# Patient Record
Sex: Female | Born: 1935
Health system: Southern US, Community
[De-identification: ages and names within clinical notes are randomized; demographics above are authoritative.]

## PROBLEM LIST (undated history)

## (undated) DIAGNOSIS — C210 Malignant neoplasm of anus, unspecified: Secondary | ICD-10-CM

## (undated) DIAGNOSIS — K5730 Diverticulosis of large intestine without perforation or abscess without bleeding: Secondary | ICD-10-CM

## (undated) DIAGNOSIS — I10 Essential (primary) hypertension: Secondary | ICD-10-CM

## (undated) DIAGNOSIS — I83812 Varicose veins of left lower extremities with pain: Secondary | ICD-10-CM

## (undated) DIAGNOSIS — K602 Anal fissure, unspecified: Secondary | ICD-10-CM

## (undated) DIAGNOSIS — IMO0002 Reserved for concepts with insufficient information to code with codable children: Secondary | ICD-10-CM

## (undated) DIAGNOSIS — M1990 Unspecified osteoarthritis, unspecified site: Secondary | ICD-10-CM

## (undated) DIAGNOSIS — F0390 Unspecified dementia without behavioral disturbance: Secondary | ICD-10-CM

## (undated) HISTORY — DX: Essential (primary) hypertension: I10

## (undated) HISTORY — PX: BLADDER REPAIR: SHX76

## (undated) HISTORY — DX: Anal fissure, unspecified: K60.2

## (undated) HISTORY — DX: Diverticulosis of large intestine without perforation or abscess without bleeding: K57.30

## (undated) HISTORY — PX: APPENDECTOMY: SHX54

## (undated) HISTORY — PX: POLYPECTOMY: SHX149

## (undated) HISTORY — DX: Reserved for concepts with insufficient information to code with codable children: IMO0002

## (undated) HISTORY — DX: Unspecified osteoarthritis, unspecified site: M19.90

## (undated) HISTORY — PX: ABDOMINAL HYSTERECTOMY: SUR658

---

## 1968-11-18 HISTORY — PX: NEPHRECTOMY: SHX65

## 2000-05-31 ENCOUNTER — Other Ambulatory Visit: Admission: RE | Admit: 2000-05-31 | Discharge: 2000-05-31 | Payer: Self-pay | Admitting: Internal Medicine

## 2000-08-31 ENCOUNTER — Ambulatory Visit (HOSPITAL_COMMUNITY): Admission: RE | Admit: 2000-08-31 | Discharge: 2000-08-31 | Payer: Self-pay | Admitting: *Deleted

## 2000-11-12 ENCOUNTER — Encounter (INDEPENDENT_AMBULATORY_CARE_PROVIDER_SITE_OTHER): Payer: Self-pay

## 2000-11-13 ENCOUNTER — Inpatient Hospital Stay (HOSPITAL_COMMUNITY): Admission: EM | Admit: 2000-11-13 | Discharge: 2000-11-14 | Payer: Self-pay | Admitting: Obstetrics and Gynecology

## 2001-07-31 ENCOUNTER — Other Ambulatory Visit: Admission: RE | Admit: 2001-07-31 | Discharge: 2001-07-31 | Payer: Self-pay | Admitting: Obstetrics and Gynecology

## 2002-03-20 DIAGNOSIS — C210 Malignant neoplasm of anus, unspecified: Secondary | ICD-10-CM

## 2002-03-20 HISTORY — DX: Malignant neoplasm of anus, unspecified: C21.0

## 2002-06-20 ENCOUNTER — Encounter: Payer: Self-pay | Admitting: General Surgery

## 2002-06-20 ENCOUNTER — Encounter: Admission: RE | Admit: 2002-06-20 | Discharge: 2002-06-20 | Payer: Self-pay | Admitting: General Surgery

## 2002-06-24 ENCOUNTER — Ambulatory Visit: Admission: RE | Admit: 2002-06-24 | Discharge: 2002-08-27 | Payer: Self-pay | Admitting: Radiation Oncology

## 2002-07-02 ENCOUNTER — Ambulatory Visit (HOSPITAL_COMMUNITY): Admission: RE | Admit: 2002-07-02 | Discharge: 2002-07-02 | Payer: Self-pay | Admitting: Hematology & Oncology

## 2002-07-02 ENCOUNTER — Encounter: Payer: Self-pay | Admitting: Hematology & Oncology

## 2002-07-30 ENCOUNTER — Ambulatory Visit (HOSPITAL_COMMUNITY): Admission: RE | Admit: 2002-07-30 | Discharge: 2002-07-30 | Payer: Self-pay | Admitting: Internal Medicine

## 2002-09-01 ENCOUNTER — Ambulatory Visit: Admission: RE | Admit: 2002-09-01 | Discharge: 2002-09-01 | Payer: Self-pay | Admitting: Radiation Oncology

## 2002-09-11 ENCOUNTER — Ambulatory Visit: Admission: RE | Admit: 2002-09-11 | Discharge: 2002-09-11 | Payer: Self-pay | Admitting: Radiation Oncology

## 2002-09-15 ENCOUNTER — Ambulatory Visit (HOSPITAL_COMMUNITY): Admission: RE | Admit: 2002-09-15 | Discharge: 2002-09-15 | Payer: Self-pay | Admitting: Hematology & Oncology

## 2002-09-15 ENCOUNTER — Encounter: Payer: Self-pay | Admitting: Hematology & Oncology

## 2002-09-17 ENCOUNTER — Encounter: Payer: Self-pay | Admitting: Hematology & Oncology

## 2002-09-17 ENCOUNTER — Ambulatory Visit (HOSPITAL_COMMUNITY): Admission: RE | Admit: 2002-09-17 | Discharge: 2002-09-17 | Payer: Self-pay | Admitting: Hematology & Oncology

## 2002-12-05 ENCOUNTER — Ambulatory Visit (HOSPITAL_COMMUNITY): Admission: RE | Admit: 2002-12-05 | Discharge: 2002-12-05 | Payer: Self-pay | Admitting: Hematology & Oncology

## 2002-12-05 ENCOUNTER — Encounter: Payer: Self-pay | Admitting: Hematology & Oncology

## 2003-01-01 ENCOUNTER — Ambulatory Visit: Admission: RE | Admit: 2003-01-01 | Discharge: 2003-01-01 | Payer: Self-pay | Admitting: Radiation Oncology

## 2003-04-08 ENCOUNTER — Ambulatory Visit (HOSPITAL_COMMUNITY): Admission: RE | Admit: 2003-04-08 | Discharge: 2003-04-08 | Payer: Self-pay | Admitting: Hematology & Oncology

## 2003-05-08 ENCOUNTER — Ambulatory Visit: Admission: RE | Admit: 2003-05-08 | Discharge: 2003-05-08 | Payer: Self-pay | Admitting: Radiation Oncology

## 2003-05-15 ENCOUNTER — Other Ambulatory Visit: Admission: RE | Admit: 2003-05-15 | Discharge: 2003-05-15 | Payer: Self-pay | Admitting: Obstetrics and Gynecology

## 2003-07-23 ENCOUNTER — Ambulatory Visit (HOSPITAL_COMMUNITY): Admission: RE | Admit: 2003-07-23 | Discharge: 2003-07-23 | Payer: Self-pay | Admitting: Hematology & Oncology

## 2003-12-01 ENCOUNTER — Ambulatory Visit (HOSPITAL_COMMUNITY): Admission: RE | Admit: 2003-12-01 | Discharge: 2003-12-01 | Payer: Self-pay | Admitting: Hematology & Oncology

## 2004-03-03 ENCOUNTER — Ambulatory Visit: Admission: RE | Admit: 2004-03-03 | Discharge: 2004-03-03 | Payer: Self-pay | Admitting: Radiation Oncology

## 2004-04-20 ENCOUNTER — Ambulatory Visit: Payer: Self-pay | Admitting: Hematology & Oncology

## 2004-04-21 ENCOUNTER — Ambulatory Visit (HOSPITAL_COMMUNITY): Admission: RE | Admit: 2004-04-21 | Discharge: 2004-04-21 | Payer: Self-pay | Admitting: Hematology & Oncology

## 2004-05-16 ENCOUNTER — Other Ambulatory Visit: Admission: RE | Admit: 2004-05-16 | Discharge: 2004-05-16 | Payer: Self-pay | Admitting: Obstetrics and Gynecology

## 2004-05-18 HISTORY — PX: CATARACT EXTRACTION: SUR2

## 2004-06-10 ENCOUNTER — Ambulatory Visit (HOSPITAL_COMMUNITY): Admission: RE | Admit: 2004-06-10 | Discharge: 2004-06-10 | Payer: Self-pay | Admitting: Ophthalmology

## 2004-10-19 ENCOUNTER — Ambulatory Visit: Payer: Self-pay | Admitting: Hematology & Oncology

## 2004-10-24 ENCOUNTER — Ambulatory Visit (HOSPITAL_COMMUNITY): Admission: RE | Admit: 2004-10-24 | Discharge: 2004-10-24 | Payer: Self-pay | Admitting: Hematology & Oncology

## 2005-04-28 ENCOUNTER — Ambulatory Visit: Payer: Self-pay | Admitting: Hematology & Oncology

## 2005-05-02 ENCOUNTER — Ambulatory Visit (HOSPITAL_COMMUNITY): Admission: RE | Admit: 2005-05-02 | Discharge: 2005-05-02 | Payer: Self-pay | Admitting: Hematology & Oncology

## 2005-05-16 ENCOUNTER — Other Ambulatory Visit: Admission: RE | Admit: 2005-05-16 | Discharge: 2005-05-16 | Payer: Self-pay | Admitting: Obstetrics and Gynecology

## 2005-10-02 ENCOUNTER — Emergency Department (HOSPITAL_COMMUNITY): Admission: EM | Admit: 2005-10-02 | Discharge: 2005-10-02 | Payer: Self-pay | Admitting: Emergency Medicine

## 2005-11-02 ENCOUNTER — Ambulatory Visit: Payer: Self-pay | Admitting: Hematology & Oncology

## 2005-11-02 LAB — COMPREHENSIVE METABOLIC PANEL
Alkaline Phosphatase: 79 U/L (ref 39–117)
BUN: 22 mg/dL (ref 6–23)
Glucose, Bld: 105 mg/dL — ABNORMAL HIGH (ref 70–99)
Total Bilirubin: 0.5 mg/dL (ref 0.3–1.2)

## 2005-11-02 LAB — CBC WITH DIFFERENTIAL/PLATELET
Basophils Absolute: 0 10*3/uL (ref 0.0–0.1)
EOS%: 5.5 % (ref 0.0–7.0)
Eosinophils Absolute: 0.2 10*3/uL (ref 0.0–0.5)
LYMPH%: 23 % (ref 14.0–48.0)
MCH: 29.5 pg (ref 26.0–34.0)
MCV: 85.3 fL (ref 81.0–101.0)
MONO%: 9.2 % (ref 0.0–13.0)
NEUT#: 2.1 10*3/uL (ref 1.5–6.5)
Platelets: 168 10*3/uL (ref 145–400)
RBC: 4.72 10*6/uL (ref 3.70–5.32)

## 2006-04-30 ENCOUNTER — Ambulatory Visit: Payer: Self-pay | Admitting: Hematology & Oncology

## 2006-05-21 ENCOUNTER — Other Ambulatory Visit: Admission: RE | Admit: 2006-05-21 | Discharge: 2006-05-21 | Payer: Self-pay | Admitting: Obstetrics and Gynecology

## 2008-04-08 ENCOUNTER — Encounter: Admission: RE | Admit: 2008-04-08 | Discharge: 2008-04-08 | Payer: Self-pay | Admitting: Internal Medicine

## 2008-04-25 ENCOUNTER — Emergency Department (HOSPITAL_COMMUNITY): Admission: EM | Admit: 2008-04-25 | Discharge: 2008-04-25 | Payer: Self-pay | Admitting: Emergency Medicine

## 2008-05-20 ENCOUNTER — Other Ambulatory Visit: Admission: RE | Admit: 2008-05-20 | Discharge: 2008-05-20 | Payer: Self-pay | Admitting: Obstetrics and Gynecology

## 2008-05-21 ENCOUNTER — Ambulatory Visit: Payer: Self-pay | Admitting: Obstetrics and Gynecology

## 2008-05-21 ENCOUNTER — Encounter: Payer: Self-pay | Admitting: Obstetrics and Gynecology

## 2008-08-16 ENCOUNTER — Encounter (INDEPENDENT_AMBULATORY_CARE_PROVIDER_SITE_OTHER): Payer: Self-pay | Admitting: Emergency Medicine

## 2008-08-16 ENCOUNTER — Emergency Department (HOSPITAL_COMMUNITY): Admission: EM | Admit: 2008-08-16 | Discharge: 2008-08-16 | Payer: Self-pay | Admitting: Emergency Medicine

## 2008-08-16 ENCOUNTER — Ambulatory Visit: Payer: Self-pay | Admitting: *Deleted

## 2010-03-19 ENCOUNTER — Emergency Department (HOSPITAL_COMMUNITY)
Admission: EM | Admit: 2010-03-19 | Discharge: 2010-03-19 | Payer: Self-pay | Source: Home / Self Care | Admitting: Emergency Medicine

## 2010-03-23 ENCOUNTER — Emergency Department (HOSPITAL_COMMUNITY)
Admission: EM | Admit: 2010-03-23 | Discharge: 2010-03-23 | Payer: Self-pay | Source: Home / Self Care | Admitting: Emergency Medicine

## 2010-03-25 ENCOUNTER — Emergency Department (HOSPITAL_COMMUNITY)
Admission: EM | Admit: 2010-03-25 | Discharge: 2010-03-25 | Payer: Self-pay | Source: Home / Self Care | Admitting: Emergency Medicine

## 2010-04-01 ENCOUNTER — Emergency Department (HOSPITAL_COMMUNITY)
Admission: EM | Admit: 2010-04-01 | Discharge: 2010-04-01 | Payer: Self-pay | Source: Home / Self Care | Admitting: Emergency Medicine

## 2010-04-09 ENCOUNTER — Encounter: Payer: Self-pay | Admitting: Hematology & Oncology

## 2010-05-23 ENCOUNTER — Encounter (INDEPENDENT_AMBULATORY_CARE_PROVIDER_SITE_OTHER): Payer: Medicare Other | Admitting: Obstetrics and Gynecology

## 2010-05-23 ENCOUNTER — Other Ambulatory Visit (HOSPITAL_COMMUNITY)
Admission: RE | Admit: 2010-05-23 | Discharge: 2010-05-23 | Disposition: A | Discharge status: Home / Self Care | Payer: Medicare Other | Source: Ambulatory Visit | Attending: Obstetrics and Gynecology | Admitting: Obstetrics and Gynecology

## 2010-05-23 ENCOUNTER — Other Ambulatory Visit: Payer: Self-pay | Admitting: Obstetrics and Gynecology

## 2010-05-23 DIAGNOSIS — N952 Postmenopausal atrophic vaginitis: Secondary | ICD-10-CM

## 2010-05-23 DIAGNOSIS — N951 Menopausal and female climacteric states: Secondary | ICD-10-CM

## 2010-05-23 DIAGNOSIS — Z124 Encounter for screening for malignant neoplasm of cervix: Secondary | ICD-10-CM

## 2010-05-23 DIAGNOSIS — K648 Other hemorrhoids: Secondary | ICD-10-CM

## 2010-07-05 LAB — COMPREHENSIVE METABOLIC PANEL
ALT: 25 U/L (ref 0–35)
CO2: 28 mEq/L (ref 19–32)
Chloride: 104 mEq/L (ref 96–112)
Creatinine, Ser: 0.92 mg/dL (ref 0.4–1.2)
Potassium: 4.2 mEq/L (ref 3.5–5.1)
Sodium: 140 mEq/L (ref 135–145)
Total Protein: 5.8 g/dL — ABNORMAL LOW (ref 6.0–8.3)

## 2010-07-05 LAB — CBC
HCT: 34.7 % — ABNORMAL LOW (ref 36.0–46.0)
Hemoglobin: 12.3 g/dL (ref 12.0–15.0)
MCV: 86.6 fL (ref 78.0–100.0)
RBC: 4 MIL/uL (ref 3.87–5.11)
RDW: 13.8 % (ref 11.5–15.5)
WBC: 4.1 10*3/uL (ref 4.0–10.5)

## 2010-07-05 LAB — DIFFERENTIAL
Basophils Absolute: 0 10*3/uL (ref 0.0–0.1)
Eosinophils Relative: 5 % (ref 0–5)
Neutro Abs: 2.7 10*3/uL (ref 1.7–7.7)

## 2010-07-05 LAB — URINE MICROSCOPIC-ADD ON

## 2010-07-05 LAB — URINALYSIS, ROUTINE W REFLEX MICROSCOPIC
Ketones, ur: NEGATIVE mg/dL
Specific Gravity, Urine: 1.015 (ref 1.005–1.030)
Urobilinogen, UA: 0.2 mg/dL (ref 0.0–1.0)
pH: 6.5 (ref 5.0–8.0)

## 2010-07-12 ENCOUNTER — Other Ambulatory Visit: Payer: Self-pay | Admitting: Internal Medicine

## 2010-07-12 ENCOUNTER — Ambulatory Visit
Admission: RE | Admit: 2010-07-12 | Discharge: 2010-07-12 | Disposition: A | Discharge status: Home / Self Care | Payer: PRIVATE HEALTH INSURANCE | Source: Ambulatory Visit | Attending: Internal Medicine | Admitting: Internal Medicine

## 2010-07-12 DIAGNOSIS — M545 Low back pain: Secondary | ICD-10-CM

## 2010-07-13 ENCOUNTER — Other Ambulatory Visit (HOSPITAL_COMMUNITY): Payer: Self-pay | Admitting: Internal Medicine

## 2010-07-13 DIAGNOSIS — M533 Sacrococcygeal disorders, not elsewhere classified: Secondary | ICD-10-CM

## 2010-07-13 DIAGNOSIS — R52 Pain, unspecified: Secondary | ICD-10-CM

## 2010-07-15 ENCOUNTER — Encounter (HOSPITAL_COMMUNITY)
Admission: RE | Admit: 2010-07-15 | Discharge: 2010-07-15 | Disposition: A | Discharge status: Home / Self Care | Payer: Medicare Other | Source: Ambulatory Visit | Attending: Internal Medicine | Admitting: Internal Medicine

## 2010-07-15 ENCOUNTER — Ambulatory Visit (HOSPITAL_COMMUNITY): Payer: PRIVATE HEALTH INSURANCE

## 2010-07-15 DIAGNOSIS — M533 Sacrococcygeal disorders, not elsewhere classified: Secondary | ICD-10-CM

## 2010-07-15 DIAGNOSIS — Z905 Acquired absence of kidney: Secondary | ICD-10-CM | POA: Insufficient documentation

## 2010-07-15 DIAGNOSIS — C20 Malignant neoplasm of rectum: Secondary | ICD-10-CM | POA: Insufficient documentation

## 2010-07-15 MED ORDER — TECHNETIUM TC 99M MEDRONATE IV KIT
23.1000 | PACK | Freq: Once | INTRAVENOUS | Status: AC | PRN
  Administered 2010-07-15: 23.1 via INTRAVENOUS

## 2010-07-21 ENCOUNTER — Ambulatory Visit (HOSPITAL_COMMUNITY): Payer: PRIVATE HEALTH INSURANCE

## 2010-07-21 ENCOUNTER — Encounter (HOSPITAL_COMMUNITY): Payer: PRIVATE HEALTH INSURANCE

## 2010-07-22 ENCOUNTER — Other Ambulatory Visit: Payer: Self-pay | Admitting: Oncology

## 2010-07-22 ENCOUNTER — Encounter (HOSPITAL_BASED_OUTPATIENT_CLINIC_OR_DEPARTMENT_OTHER): Payer: Medicare Other | Admitting: Oncology

## 2010-07-22 DIAGNOSIS — Z85048 Personal history of other malignant neoplasm of rectum, rectosigmoid junction, and anus: Secondary | ICD-10-CM

## 2010-07-22 DIAGNOSIS — M899 Disorder of bone, unspecified: Secondary | ICD-10-CM

## 2010-07-22 DIAGNOSIS — C189 Malignant neoplasm of colon, unspecified: Secondary | ICD-10-CM

## 2010-07-22 DIAGNOSIS — I10 Essential (primary) hypertension: Secondary | ICD-10-CM

## 2010-07-22 DIAGNOSIS — Z85828 Personal history of other malignant neoplasm of skin: Secondary | ICD-10-CM

## 2010-07-22 DIAGNOSIS — C210 Malignant neoplasm of anus, unspecified: Secondary | ICD-10-CM

## 2010-07-22 LAB — CBC WITH DIFFERENTIAL/PLATELET
BASO%: 0.3 % (ref 0.0–2.0)
Basophils Absolute: 0 10*3/uL (ref 0.0–0.1)
EOS%: 2.9 % (ref 0.0–7.0)
Eosinophils Absolute: 0.1 10*3/uL (ref 0.0–0.5)
HCT: 33.9 % — ABNORMAL LOW (ref 34.8–46.6)
HGB: 11.9 g/dL (ref 11.6–15.9)
MCV: 89.4 fL (ref 79.5–101.0)
MONO#: 0.3 10*3/uL (ref 0.1–0.9)
MONO%: 6.6 % (ref 0.0–14.0)
NEUT#: 3.6 10*3/uL (ref 1.5–6.5)
NEUT%: 76.4 % (ref 38.4–76.8)
Platelets: 136 10*3/uL — ABNORMAL LOW (ref 145–400)
RBC: 3.79 10*6/uL (ref 3.70–5.45)
lymph#: 0.7 10*3/uL — ABNORMAL LOW (ref 0.9–3.3)

## 2010-07-26 LAB — COMPREHENSIVE METABOLIC PANEL
AST: 22 U/L (ref 0–37)
Albumin: 4.2 g/dL (ref 3.5–5.2)
Calcium: 9.4 mg/dL (ref 8.4–10.5)
Chloride: 103 mEq/L (ref 96–112)
Glucose, Bld: 112 mg/dL — ABNORMAL HIGH (ref 70–99)

## 2010-07-26 LAB — SPEP & IFE WITH QIG: Alpha-2-Globulin: 10.3 % (ref 7.1–11.8)

## 2010-07-26 LAB — LACTATE DEHYDROGENASE: LDH: 156 U/L (ref 94–250)

## 2010-07-29 ENCOUNTER — Encounter (HOSPITAL_COMMUNITY)
Admission: RE | Admit: 2010-07-29 | Discharge: 2010-07-29 | Disposition: A | Discharge status: Home / Self Care | Payer: Medicare Other | Source: Ambulatory Visit | Attending: Oncology | Admitting: Oncology

## 2010-07-29 ENCOUNTER — Encounter (HOSPITAL_COMMUNITY): Payer: Self-pay

## 2010-07-29 DIAGNOSIS — C7951 Secondary malignant neoplasm of bone: Secondary | ICD-10-CM | POA: Insufficient documentation

## 2010-07-29 DIAGNOSIS — C7952 Secondary malignant neoplasm of bone marrow: Secondary | ICD-10-CM | POA: Insufficient documentation

## 2010-07-29 DIAGNOSIS — C210 Malignant neoplasm of anus, unspecified: Secondary | ICD-10-CM | POA: Insufficient documentation

## 2010-07-29 DIAGNOSIS — C189 Malignant neoplasm of colon, unspecified: Secondary | ICD-10-CM | POA: Insufficient documentation

## 2010-07-29 MED ORDER — FLUDEOXYGLUCOSE F - 18 (FDG) INJECTION
16.4000 | Freq: Once | INTRAVENOUS | Status: AC | PRN
  Administered 2010-07-29: 16.4 via INTRAVENOUS

## 2010-08-05 NOTE — Discharge Summary (Signed)
[facility]  Patient:    Shannon Blair, WITZKE Visit Number: [id number] MRN: [id number]          Service Type: SUR Location: 4W 0460 [id number] Attending Physician:  Sharon Mt Dictated by:   Rande Brunt. Eda Paschal, M.D. Adm. Date:  11/12/2000 Disc. Date: 11/14/00                             Discharge Summary  HISTORY OF PRESENT ILLNESS:  The patient is a 77 year old female who was admitted to the hospital with uterine prolapse, cystocele, and rectocele for surgery.  HOSPITAL COURSE:  On the day of admission, she underwent a vaginal hysterectomy, bilateral salpingo-oophorectomy, anterior and posterior repair. Postoperatively, she did well except for having to keep her Foley in for two days because of swelling from the bladder repair and an ileus which responded to IVs and restricting her diet.  By the second postoperative day, her Foley was ready to be removed.  Assuming she voids well, she will be discharged later today.  DISCHARGE MEDICATIONS: 1. Cipro 250 mg b.i.d. to finish a 10 day course because of a urinary tract    infection of greater than 100,000 E. coli sensitive to Cipro. 2. Vicodin for pain medication.  ACTIVITY:  Ambulatory.  CONDITION ON DISCHARGE:  Improved.  DISCHARGE DIAGNOSES: 1. Uterine prolapse. 2. Cystocele, rectocele.  OPERATION:  Vaginal hysterectomy, bilateral salpingo-oophorectomy, anterior and posterior repair. Dictated by:   Rande Brunt. Eda Paschal, M.D. Attending Physician:  Sharon Mt DD:  11/14/00 TD:  11/14/00 Job: [id number]

## 2010-08-05 NOTE — Op Note (Signed)
NAMELYANNA, BLYSTONE NO.:  [id number]   MEDICAL RECORD NO.:  [id number]          PATIENT TYPE:  OIB   LOCATION:  2899                         FACILITY:  [facility]   PHYSICIAN:  Guadelupe Sabin, M.D.DATE OF BIRTH:  1932-01-03   DATE OF PROCEDURE:  06/10/2004  DATE OF DISCHARGE:  06/10/2004                                 OPERATIVE REPORT   PREOPERATIVE DIAGNOSIS:  Senile nuclear cataract, right eye.   POSTOPERATIVE DIAGNOSIS:  Senile nuclear cataract, right eye.   NAME OF OPERATION:  Planned extracapsular cataract extraction --  phacoemulsification, primary insertion of posterior chamber intraocular lens  implant.   SURGEON:  Guadelupe Sabin, M.D.   ASSISTANT:  Nurse.   ANESTHESIA:  Local 4% Xylocaine, 0.75% Marcaine, anesthesia standby required  in this elderly patient, patient given sodium Pentothal under in the period  of retrobulbar injection.   OPERATIVE PROCEDURE:  After the patient was prepped and draped, a lid  speculum was inserted in the right eye.  The eye was turned downward and a  superior rectus traction suture placed.  There was slight conjunctival  bleeding which was cauterized and stopped.  A peritomy was performed  adjacent to the limbus from the 11 to 1 o'clock position.  The corneoscleral  junction was cleaned and a corneoscleral groove made with a 45-degrees  Superblade.  The anterior chamber was then entered at the 12 o'clock  position with a 2.5-mm diamond keratome and at the 2:30 position with a 15-  degree blade.  Using a bent 26-gauge needle on an OcuCoat syringe, a  circular capsulorrhexis was begun.  It was difficult to see the circular  capsulorrhexis and it was somewhat irregular.  Hydrodissection and  hydrodelineation were performed using 1% Xylocaine.  A 30-degree  phacoemulsification tip was then inserted with slow controlled  emulsification of the lens nucleus with back-cracking and fragmentation with  the Bechert pick.   Total ultrasonic time -- 32 seconds, average power level  -- 16%, total amount of fluid used -- 50 mL.  Following removal of the  slightly brunescent nucleus, the residual cortex was aspirated with the  silicone-tipped irrigation-aspiration tip.  The posterior capsule appeared  intact with a brilliant red fundus reflex; it was therefore elected to  insert an [employer] [id number] silicone three-piece posterior  chamber implant, diopter strength +24.00; this was inserted with the  McDonald forceps into the anterior chamber and then centered into the  capsular bag using the [facility] lens rotator; the lens appeared to be well-  centered.  The OcuCoat and Provisc which had been used previously were  aspirated and replaced with balanced salt solution and Miochol ophthalmic  solution.  The operative incision which had been extended to approximately 4-  5 mm was sutured with three 10-0 interrupted  nylon sutures.  Maxitrol ointment was instilled in the conjunctival cul-de-  sac.  A light patch and protective shield were applied to the operated right  eye.  Duration of procedure and anesthesia administration -- 45 minutes.  The patient tolerated the procedure well in general and left the operating  room for the recovery room in good condition.      HNJ/MEDQ  D:  06/10/2004  T:  06/11/2004  Job:  [id number]

## 2010-08-05 NOTE — Procedures (Signed)
[facility]  Patient:    Shannon Blair, Shannon Blair                       MRN: [id number] Proc. Date: 08/31/00 Attending:  Sabino Gasser, M.D.                           Procedure Report  PROCEDURE:  Colonoscopy.  INDICATIONS:  Rectal bleeding.  ANESTHESIA:  Demerol 70 mg, Versed 6.5 mg.  DESCRIPTION OF PROCEDURE:  With the patient mildly sedated in the left lateral decubitus position, a rectal examination was performed.  There was red blood on the examining finger.  Hemorrhoids were seen externally.  The colonoscope was inserted into the rectum, and immediately we saw a fissure in the anal canal, and this was photographed.  It was bleeding.  We advanced from this point to the cecum, identified by the ileocecal valve and appendiceal orifice, both of which were photographed.  From this point, the colonoscope was slowly withdrawn taking circumferential views of the entire colonic mucosa stopping to photograph rare diverticula seen along the way in the sigmoid colon until we reached the rectum again, which appeared normal on direct view and showed large internal hemorrhoids and the fissure on retroflexed view.  The patients vital signs and pulse oximeter remained stable.  The patient tolerated the procedure well without apparent complications.  FINDINGS:  Fissure of anal canal, large internal hemorrhoids, rare diverticulum of sigmoid colon, otherwise unremarkable examination.  PLAN:  Have patient follow up with me as needed. DD:  08/31/00 TD:  08/31/00 Job: [id number]

## 2010-08-05 NOTE — H&P (Signed)
[name], REGAL NO.:  [id number]   MEDICAL RECORD NO.:  [id number]          PATIENT TYPE:  OIB   LOCATION:  2899                         FACILITY:  [facility]   PHYSICIAN:  Shannon Blair, M.D.DATE OF BIRTH:  01-10-1932   DATE OF ADMISSION:  06/10/2004  DATE OF DISCHARGE:                                HISTORY & PHYSICAL   REASON FOR ADMISSION:  This was a planned outpatient surgical admission of  this 77 year old, white female admitted for cataract implant surgery of the  right eye.   PRESENT ILLNESS:  This patient has been followed in my office for routine  eye care since August 28, 1975.  Examination at that time revealed early  presbyopia.  Over the ensuing years the patient has done well with  refractive changes.  Recently, however, beginning in 1999, slight anterior  subcapsular and cortical cataract change has developed in both eyes.  Vision  has remained fairly well, but the patient has had increasing opacity of both  eyes.  When she was seen on May 16, 2004, vision was recorded at 20/25  plus right eye, 20/20 minus left eye.  The patient complained of blurred  vision.  She stated she no longer could see the road at night and glare was  extremely bothersome.  A glare test revealed a visual acuity of less than  20/400 in each eye.  The patient, due to her complaints, elected to proceed  with cataract implant surgery of the left eye now, right eye later.  Slit  lamp examination confirmed the presence of a dense nuclear and cortical  cataract change in both eyes.  Applanation tonometry was normal 17 mm right  eye, 20 left eye.  Dilated fundus examination was normal with a clear  vitreous, attached retina, normal optic nerve blood vessels and macula.   PAST MEDICAL HISTORY:  The patient is felt to be in satisfactory condition  by Dr. Nicholos Blair. Dr. Nicholos Blair notes the patient's good general  health and good cardiorespiratory system.  The patient  is to discontinue her  81 mg of aspirin prior to surgery.   ALLERGIES:  She has no other no allergies.   REVIEW OF SYSTEMS:  No cardiorespiratory complaints.   PHYSICAL EXAMINATION:  GENERAL:  The patient is alert, well-nourished, well-  developed, white female in no acute distress.  VITAL SIGNS:  As recorded on admission, blood pressure 147/68, temperature  97.8, heart rate 71, respirations 12.  HEENT:  EYES:  Ocular exam as noted above.  CHEST/LUNGS:  Clear to percussion and auscultation.  HEART:  Normal sinus rhythm, no cardiomegaly.  No murmurs.  ABDOMEN:  Negative.  EXTREMITIES:  Negative.   ADMISSION DIAGNOSIS:  Senile cataract both eyes.   PLAN:  Cataract implant surgery left eye now, right eye later.      HNJ/MEDQ  D:  06/10/2004  T:  06/10/2004  Job:  [id number]   cc:   Shannon Blair, M.D.  [address]  [place]  [place] [address]  Fax: [phone]

## 2010-08-12 ENCOUNTER — Encounter (HOSPITAL_BASED_OUTPATIENT_CLINIC_OR_DEPARTMENT_OTHER): Payer: Medicare Other | Admitting: Oncology

## 2010-08-12 DIAGNOSIS — C7951 Secondary malignant neoplasm of bone: Secondary | ICD-10-CM

## 2010-08-12 DIAGNOSIS — Z85048 Personal history of other malignant neoplasm of rectum, rectosigmoid junction, and anus: Secondary | ICD-10-CM

## 2010-08-31 ENCOUNTER — Ambulatory Visit (INDEPENDENT_AMBULATORY_CARE_PROVIDER_SITE_OTHER): Payer: Medicare Other | Admitting: Internal Medicine

## 2010-08-31 ENCOUNTER — Encounter: Payer: Self-pay | Admitting: Internal Medicine

## 2010-08-31 VITALS — BP 138/60 | HR 76 | Ht 63.0 in | Wt 142.6 lb

## 2010-08-31 DIAGNOSIS — R1084 Generalized abdominal pain: Secondary | ICD-10-CM

## 2010-08-31 DIAGNOSIS — K5900 Constipation, unspecified: Secondary | ICD-10-CM

## 2010-08-31 DIAGNOSIS — R933 Abnormal findings on diagnostic imaging of other parts of digestive tract: Secondary | ICD-10-CM

## 2010-08-31 MED ORDER — PEG-KCL-NACL-NASULF-NA ASC-C 100 G PO SOLR: 1.0000 | Freq: Once | ORAL | Status: DC

## 2010-08-31 NOTE — Patient Instructions (Signed)
[facility]on [facility] 09/07/10 2:00 pm arrive at 1:00 pm  Moviprep prescription sent to pharmacy Colonoscopy brochure given for you to review.

## 2010-08-31 NOTE — Progress Notes (Signed)
HISTORY OF PRESENT ILLNESS:  Shannon Blair is a 77 y.o. female with hypertension, osteoarthritis, squamous cell carcinoma of the skin, and squamous rectal cancer diagnosed in 2004. She was treated with concomitant radiation and chemotherapy. No surgery. She has had regular check ups with Dr. Zachery Dakins, as recently as this past year, with no evidence of recurrent disease. Initial colonoscopy performed by Dr. Sabino Gasser in 2002 for rectal bleeding. The examination was said to show a fissure of the anal canal as well as large internal hemorrhoids and rare sigmoid diverticula. Her recent history is that of low back abdominal pain which led to lumbar spine films as well as spinal MRI. Changes of metastatic bone disease identified. Subsequent oncology referral with additional workup including head CT. Again, metastatic appearing lesions in the bone as well as hypermetabolic activity in the rectum concerning for cancer. Patient states that her pain is better. Currently improvement and constipation after taking V-8 juice. She is accompanied by her daughter. She denies nausea, vomiting, or rectal bleeding.  REVIEW OF SYSTEMS:  All non-GI ROS negative except for back pain, confusion, fatigue.  Past Medical History  Diagnosis Date  . Colon cancer     Anus  . Hypertension   . OA (osteoarthritis)   . Squamous cell carcinoma     skin  . Anal fissure   . Colon, diverticulosis     sigmoid    Past Surgical History  Procedure Date  . Nephrectomy     Rt.  Marland Kitchen Abdominal hysterectomy   . Bladder repair     Social History RUIE SENDEJO  reports that she has never smoked. She has never used smokeless tobacco. She reports that she does not drink alcohol or use illicit drugs.  family history includes Breast cancer in her sister; Coronary artery disease in her father; Diabetes in her daughter; Leukemia in her brother; and Ovarian cancer in her sister.  There is no history of Colon cancer.  No Known  Allergies     PHYSICAL EXAMINATION: Vital signs: BP 138/60  Pulse 76  Ht 5\' 3"  (1.6 m)  Wt 142 lb 9.6 oz (64.683 kg)  BMI 25.26 kg/m2  Constitutional: generally well-appearing, no acute distress Psychiatric: alert and oriented x3, cooperative Eyes: extraocular movements intact, anicteric, conjunctiva pink Mouth: oral pharynx moist, no lesions Neck: supple no lymphadenopathy Cardiovascular: heart regular rate and rhythm, no murmur Lungs: clear to auscultation bilaterally Abdomen: soft, nontender, nondistended, no obvious ascites, no peritoneal signs, normal bowel sounds, no organomegaly Rectal: Deferred until colonoscopy Extremities: no lower extremity edema bilaterally Skin: no lesions on visible extremities Neuro: No focal deficits.  ASSESSMENT:  #1. Probable metastatic disease to the spine. #2. History of squamous cell carcinoma of the anus #3. Prior colonoscopy in 2002 as described   PLAN:  #1. ColonoscopyThe nature of the procedure, as well as the risks, benefits, and alternatives were carefully and thoroughly reviewed with the patient. Ample time for discussion and questions allowed. The patient understood, was satisfied, and agreed to proceed. Movi prep prescribed. The patient instructed on its use

## 2010-09-07 ENCOUNTER — Encounter: Payer: Self-pay | Admitting: Internal Medicine

## 2010-09-07 ENCOUNTER — Ambulatory Visit (AMBULATORY_SURGERY_CENTER): Payer: Medicare Other | Admitting: Internal Medicine

## 2010-09-07 VITALS — BP 128/58 | HR 66 | Temp 97.3°F | Resp 20 | Ht 63.0 in | Wt 142.0 lb

## 2010-09-07 DIAGNOSIS — K5730 Diverticulosis of large intestine without perforation or abscess without bleeding: Secondary | ICD-10-CM

## 2010-09-07 DIAGNOSIS — R933 Abnormal findings on diagnostic imaging of other parts of digestive tract: Secondary | ICD-10-CM

## 2010-09-07 DIAGNOSIS — K624 Stenosis of anus and rectum: Secondary | ICD-10-CM

## 2010-09-07 DIAGNOSIS — K56609 Unspecified intestinal obstruction, unspecified as to partial versus complete obstruction: Secondary | ICD-10-CM

## 2010-09-07 DIAGNOSIS — Z85048 Personal history of other malignant neoplasm of rectum, rectosigmoid junction, and anus: Secondary | ICD-10-CM

## 2010-09-07 DIAGNOSIS — Z85828 Personal history of other malignant neoplasm of skin: Secondary | ICD-10-CM

## 2010-09-07 DIAGNOSIS — L851 Acquired keratosis [keratoderma] palmaris et plantaris: Secondary | ICD-10-CM

## 2010-09-07 MED ORDER — SODIUM CHLORIDE 0.9 % IV SOLN: 500.0000 mL | INTRAVENOUS | Status: DC

## 2010-09-07 NOTE — Patient Instructions (Signed)
Discharge instructions given with verbal understanding. Handout on diverticulosis and a high fiber diet given. Resume previous medications. 

## 2010-09-08 ENCOUNTER — Telehealth: Payer: Self-pay | Admitting: *Deleted

## 2010-09-08 NOTE — Telephone Encounter (Signed)
Message left to call us if necessary. 

## 2010-09-29 ENCOUNTER — Other Ambulatory Visit: Payer: Self-pay | Admitting: Oncology

## 2010-09-29 ENCOUNTER — Encounter (HOSPITAL_BASED_OUTPATIENT_CLINIC_OR_DEPARTMENT_OTHER): Payer: Medicare Other | Admitting: Oncology

## 2010-09-29 DIAGNOSIS — Z85048 Personal history of other malignant neoplasm of rectum, rectosigmoid junction, and anus: Secondary | ICD-10-CM

## 2010-09-29 DIAGNOSIS — C7951 Secondary malignant neoplasm of bone: Secondary | ICD-10-CM

## 2010-09-29 DIAGNOSIS — Z1212 Encounter for screening for malignant neoplasm of rectum: Secondary | ICD-10-CM

## 2010-11-30 ENCOUNTER — Encounter (HOSPITAL_COMMUNITY): Payer: Self-pay

## 2010-11-30 ENCOUNTER — Encounter (HOSPITAL_COMMUNITY)
Admission: RE | Admit: 2010-11-30 | Discharge: 2010-11-30 | Disposition: A | Discharge status: Home / Self Care | Payer: Medicare Other | Source: Ambulatory Visit | Attending: Oncology | Admitting: Oncology

## 2010-11-30 ENCOUNTER — Encounter (HOSPITAL_BASED_OUTPATIENT_CLINIC_OR_DEPARTMENT_OTHER): Payer: Medicare Other | Admitting: Oncology

## 2010-11-30 ENCOUNTER — Other Ambulatory Visit: Payer: Self-pay | Admitting: Oncology

## 2010-11-30 DIAGNOSIS — C20 Malignant neoplasm of rectum: Secondary | ICD-10-CM | POA: Insufficient documentation

## 2010-11-30 DIAGNOSIS — Z85048 Personal history of other malignant neoplasm of rectum, rectosigmoid junction, and anus: Secondary | ICD-10-CM

## 2010-11-30 DIAGNOSIS — C7952 Secondary malignant neoplasm of bone marrow: Secondary | ICD-10-CM

## 2010-11-30 DIAGNOSIS — Z1212 Encounter for screening for malignant neoplasm of rectum: Secondary | ICD-10-CM

## 2010-11-30 LAB — CBC WITH DIFFERENTIAL/PLATELET
Basophils Absolute: 0 10*3/uL (ref 0.0–0.1)
Eosinophils Absolute: 0.1 10*3/uL (ref 0.0–0.5)
HCT: 38.1 % (ref 34.8–46.6)
HGB: 13.4 g/dL (ref 11.6–15.9)
LYMPH%: 19.7 % (ref 14.0–49.7)
MCV: 88.5 fL (ref 79.5–101.0)
MONO%: 6.8 % (ref 0.0–14.0)
NEUT#: 2.4 10*3/uL (ref 1.5–6.5)
NEUT%: 68.9 % (ref 38.4–76.8)
Platelets: 135 10*3/uL — ABNORMAL LOW (ref 145–400)

## 2010-11-30 LAB — COMPREHENSIVE METABOLIC PANEL
Albumin: 4.2 g/dL (ref 3.5–5.2)
Alkaline Phosphatase: 103 U/L (ref 39–117)
BUN: 15 mg/dL (ref 6–23)
Glucose, Bld: 80 mg/dL (ref 70–99)
Potassium: 4.1 mEq/L (ref 3.5–5.3)
Total Bilirubin: 0.5 mg/dL (ref 0.3–1.2)

## 2010-11-30 MED ORDER — TECHNETIUM TC 99M MEDRONATE IV KIT
24.2000 | PACK | Freq: Once | INTRAVENOUS | Status: AC | PRN
  Administered 2010-11-30: 24.2 via INTRAVENOUS

## 2010-12-02 ENCOUNTER — Encounter (HOSPITAL_BASED_OUTPATIENT_CLINIC_OR_DEPARTMENT_OTHER): Payer: Medicare Other | Admitting: Oncology

## 2010-12-02 DIAGNOSIS — C7951 Secondary malignant neoplasm of bone: Secondary | ICD-10-CM

## 2010-12-02 DIAGNOSIS — Z85048 Personal history of other malignant neoplasm of rectum, rectosigmoid junction, and anus: Secondary | ICD-10-CM

## 2011-05-05 ENCOUNTER — Other Ambulatory Visit: Payer: Self-pay | Admitting: Oncology

## 2011-05-05 DIAGNOSIS — M899 Disorder of bone, unspecified: Secondary | ICD-10-CM

## 2011-05-05 DIAGNOSIS — C189 Malignant neoplasm of colon, unspecified: Secondary | ICD-10-CM

## 2011-05-09 DIAGNOSIS — IMO0001 Reserved for inherently not codable concepts without codable children: Secondary | ICD-10-CM | POA: Diagnosis not present

## 2011-05-09 DIAGNOSIS — M5430 Sciatica, unspecified side: Secondary | ICD-10-CM | POA: Diagnosis not present

## 2011-05-09 DIAGNOSIS — M549 Dorsalgia, unspecified: Secondary | ICD-10-CM | POA: Diagnosis not present

## 2011-05-29 ENCOUNTER — Encounter (HOSPITAL_COMMUNITY)
Admission: RE | Admit: 2011-05-29 | Discharge: 2011-05-29 | Disposition: A | Discharge status: Home / Self Care | Payer: Medicare Other | Source: Ambulatory Visit | Attending: Oncology | Admitting: Oncology

## 2011-05-29 ENCOUNTER — Encounter (HOSPITAL_COMMUNITY): Payer: Self-pay

## 2011-05-29 DIAGNOSIS — C7951 Secondary malignant neoplasm of bone: Secondary | ICD-10-CM | POA: Insufficient documentation

## 2011-05-29 DIAGNOSIS — M899 Disorder of bone, unspecified: Secondary | ICD-10-CM

## 2011-05-29 DIAGNOSIS — C189 Malignant neoplasm of colon, unspecified: Secondary | ICD-10-CM | POA: Insufficient documentation

## 2011-05-29 DIAGNOSIS — C7952 Secondary malignant neoplasm of bone marrow: Secondary | ICD-10-CM | POA: Diagnosis not present

## 2011-05-29 MED ORDER — TECHNETIUM TC 99M MEDRONATE IV KIT
25.0000 | PACK | Freq: Once | INTRAVENOUS | Status: AC | PRN
  Administered 2011-05-29: 25 via INTRAVENOUS

## 2011-06-01 ENCOUNTER — Ambulatory Visit (HOSPITAL_BASED_OUTPATIENT_CLINIC_OR_DEPARTMENT_OTHER): Payer: Medicare Other | Admitting: Oncology

## 2011-06-01 ENCOUNTER — Other Ambulatory Visit: Payer: Medicare Other | Admitting: Lab

## 2011-06-01 ENCOUNTER — Telehealth: Payer: Self-pay | Admitting: Oncology

## 2011-06-01 VITALS — BP 145/71 | HR 74 | Temp 97.1°F | Ht 63.0 in | Wt 148.9 lb

## 2011-06-01 DIAGNOSIS — C801 Malignant (primary) neoplasm, unspecified: Secondary | ICD-10-CM

## 2011-06-01 DIAGNOSIS — M949 Disorder of cartilage, unspecified: Secondary | ICD-10-CM

## 2011-06-01 DIAGNOSIS — M899 Disorder of bone, unspecified: Secondary | ICD-10-CM

## 2011-06-01 DIAGNOSIS — Z85048 Personal history of other malignant neoplasm of rectum, rectosigmoid junction, and anus: Secondary | ICD-10-CM

## 2011-06-01 DIAGNOSIS — M898X9 Other specified disorders of bone, unspecified site: Secondary | ICD-10-CM

## 2011-06-01 DIAGNOSIS — C7952 Secondary malignant neoplasm of bone marrow: Secondary | ICD-10-CM | POA: Diagnosis not present

## 2011-06-01 LAB — CBC WITH DIFFERENTIAL/PLATELET
Basophils Absolute: 0 10*3/uL (ref 0.0–0.1)
Eosinophils Absolute: 0.2 10*3/uL (ref 0.0–0.5)
HGB: 12.6 g/dL (ref 11.6–15.9)
MONO%: 7.3 % (ref 0.0–14.0)
NEUT#: 2.7 10*3/uL (ref 1.5–6.5)
RBC: 4 10*6/uL (ref 3.70–5.45)
RDW: 13.6 % (ref 11.2–14.5)
WBC: 3.9 10*3/uL (ref 3.9–10.3)
lymph#: 0.7 10*3/uL — ABNORMAL LOW (ref 0.9–3.3)

## 2011-06-01 LAB — COMPREHENSIVE METABOLIC PANEL
AST: 23 U/L (ref 0–37)
Albumin: 4 g/dL (ref 3.5–5.2)
Alkaline Phosphatase: 102 U/L (ref 39–117)
BUN: 22 mg/dL (ref 6–23)
Calcium: 9.3 mg/dL (ref 8.4–10.5)
Chloride: 107 mEq/L (ref 96–112)
Glucose, Bld: 100 mg/dL — ABNORMAL HIGH (ref 70–99)
Potassium: 4.6 mEq/L (ref 3.5–5.3)
Sodium: 142 mEq/L (ref 135–145)
Total Protein: 5.5 g/dL — ABNORMAL LOW (ref 6.0–8.3)

## 2011-06-01 NOTE — Progress Notes (Signed)
Hematology and Oncology Follow Up Visit  Shannon Blair [id number] 1931-10-18 78 y.o. 06/01/2011 9:46 AM  CC: Shannon Blair, M.D.    Principle Diagnosis: This is a 78 year old female with a history of anal squamous cell carcinoma, now has questionable bony disease versus a benign bony abnormality.   Interim History:  Shannon Blair is a 78 year old female with the above history.  She is presenting today for a followup visit.  As mentioned, she had a bone scan that showed a few areas of bony uptake again suspicious for bony metastasis.  However, her workup had been relatively unrevealing.  Since the last time I saw her, she had not reported any worsening bone pain.  She did not report any hip pain.  She did not report any pathological fractures.  She did not report any constitutional symptoms.  Her energy, performance status and activity level all remains normal. No bone pain noted. No hospitalizations or illnesses.    Medications: I have reviewed the patient's current medications. Current outpatient prescriptions:aspirin 81 MG tablet, Take 81 mg by mouth daily.  , Disp: , Rfl: ;  calcium carbonate (OS-CAL) 600 MG TABS, Take 600 mg by mouth 2 (two) times daily with a meal.  , Disp: , Rfl: ;  diazepam (VALIUM) 5 MG tablet, Take 5 mg by mouth every 6 (six) hours as needed.  , Disp: , Rfl: ;  Ginkgo Biloba 120 MG TABS, Take 1 tablet by mouth daily.  , Disp: , Rfl:  lisinopril-hydrochlorothiazide (PRINZIDE,ZESTORETIC) 10-12.5 MG per tablet, Take 1 tablet by mouth daily.  , Disp: , Rfl: ;  Misc Natural Products (OSTEO BI-FLEX ADV JOINT SHIELD PO), Take 1 tablet by mouth daily.  , Disp: , Rfl: ;  Multiple Vitamin (MULTIVITAMIN) tablet, Take 1 tablet by mouth daily.  , Disp: , Rfl: ;  Omega-3 Fatty Acids (FISH OIL) 1000 MG CAPS, Take 1 capsule by mouth daily.  , Disp: , Rfl:  oxyCODONE-acetaminophen (PERCOCET) 5-325 MG per tablet, Take 1 tablet by mouth every 4 (four) hours as needed.  , Disp: , Rfl: ;   peg 3350 powder (MOVIPREP) 100 G SOLR, Take 1 kit (100 g total) by mouth once., Disp: 1 kit, Rfl: 0;  sertraline (ZOLOFT) 50 MG tablet, Take 50 mg by mouth daily.  , Disp: , Rfl: ;  vitamin C (ASCORBIC ACID) 500 MG tablet, Take 500 mg by mouth daily.  , Disp: , Rfl:  vitamin E 200 UNIT capsule, Take 200 Units by mouth 3 (three) times daily.  , Disp: , Rfl:  Current facility-administered medications:0.9 %  sodium chloride infusion, 500 mL, Intravenous, Continuous, Hilarie Fredrickson, MD  Allergies: No Known Allergies  Past Medical History, Surgical history, Social history, and Family History were reviewed and updated.  Review of Systems: Constitutional:  Negative for fever, chills, night sweats, anorexia, weight loss, pain. Cardiovascular: no chest pain or dyspnea on exertion Respiratory: no cough, shortness of breath, or wheezing Neurological: no TIA or stroke symptoms Dermatological: negative ENT: negative Skin: Negative. Gastrointestinal: no abdominal pain, change in bowel habits, or black or bloody stools Genito-Urinary: negative Hematological and Lymphatic: negative Breast: negative Musculoskeletal: negative Remaining ROS negative. Physical Exam: Blood pressure 145/71, pulse 74, temperature 97.1 F (36.2 C), temperature source Oral, height 5\' 3"  (1.6 m), weight 148 lb 14.4 oz (67.541 kg). ECOG: 1 General appearance: alert Head: Normocephalic, without obvious abnormality, atraumatic Neck: no adenopathy, no carotid bruit, no JVD, supple, symmetrical, trachea midline and thyroid not enlarged, symmetric,  no tenderness/mass/nodules Lymph nodes: Cervical, supraclavicular, and axillary nodes normal. Heart:regular rate and rhythm, S1, S2 normal, no murmur, click, rub or gallop Lung:chest clear, no wheezing, rales, normal symmetric air entry Abdomin: soft, non-tender, without masses or organomegaly EXT:no erythema, induration, or nodules   Lab Results: Lab Results  Component Value Date    WBC 3.9 06/01/2011   HGB 12.6 06/01/2011   HCT 36.2 06/01/2011   MCV 90.4 06/01/2011   PLT 124* 06/01/2011     Chemistry      Component Value Date/Time   NA 143 11/30/2010 0828   K 4.1 11/30/2010 0828   CL 106 11/30/2010 0828   CO2 29 11/30/2010 0828   BUN 15 11/30/2010 0828   CREATININE 0.98 11/30/2010 0828      Component Value Date/Time   CALCIUM 9.6 11/30/2010 0828   ALKPHOS 103 11/30/2010 0828   AST 21 11/30/2010 0828   ALT 37* 11/30/2010 0828   BILITOT 0.5 11/30/2010 0828       Radiological Studies: NUCLEAR MEDICINE WHOLE BODY BONE SCINTIGRAPHY  Technique: Whole body anterior and posterior images were obtained  approximately 3 hours after intravenous injection of  radiopharmaceutical.  Radiopharmaceutical: CURIE TC-MDP TECHNETIUM TC 75M  MEDRONATE IV KIT  Comparison: None.  Findings: Again noted are several areas of increased radiotracer  uptake within the axial skeleton consistent with bone metastasis.  When compared with the previous exam there is been interval  improvement and asymmetric increased uptake within the right  anterior iliac spine. Small focus of increased uptake within the  approximate T6 vertebra is identified. This is stable to improved  in the interval.  There are no new areas of increased or decreased radiotracer  uptake.  There is normal physiologic tracer activity within the left kidney  and urinary bladder.  Degenerative type changes are noted within the left knee.  IMPRESSION:  1. Stable to improved multifocal bone metastasis.  2. No evidence for new or progressive disease.    Impression and Plan:  A 78 year old female with the following issues. 1. History of squamous cell carcinoma anal cancer status post definitive therapy completed in 2004.  No evidence to suggest recurrent disease. 2. Questionable bony abnormalities.  At this time I continue to really see no evidence of any malignancy.  These radioactive uptake in the bone scan, some of  them have diminished, some of them have remained stable.  I discussed the option with Shannon Blair of continued observation and repeat imaging studies in 12 months versus biopsy.  She declined a biopsy at this point given the fact that she is clinically doing well and has not had any deterioration.  Continue close observation and will biopsy if we see increase in her diffuse disease or any other symptomatology.  At this point there is still the possibility this could be a benign bone problem such as Paget's disease or any bone turnover disease, so will continue to monitor this closely. 3. Follow up will be in 6 months.   [facility], MD 3/14/20139:46 AM

## 2011-06-01 NOTE — Telephone Encounter (Signed)
scheduled pt for appt for 09/20

## 2011-06-02 DIAGNOSIS — N390 Urinary tract infection, site not specified: Secondary | ICD-10-CM | POA: Diagnosis not present

## 2011-06-02 DIAGNOSIS — R5381 Other malaise: Secondary | ICD-10-CM | POA: Diagnosis not present

## 2011-06-02 DIAGNOSIS — I10 Essential (primary) hypertension: Secondary | ICD-10-CM | POA: Diagnosis not present

## 2011-06-02 DIAGNOSIS — R5383 Other fatigue: Secondary | ICD-10-CM | POA: Diagnosis not present

## 2011-06-02 DIAGNOSIS — M159 Polyosteoarthritis, unspecified: Secondary | ICD-10-CM | POA: Diagnosis not present

## 2011-06-14 ENCOUNTER — Other Ambulatory Visit: Payer: Self-pay | Admitting: Internal Medicine

## 2011-06-14 ENCOUNTER — Ambulatory Visit
Admission: RE | Admit: 2011-06-14 | Discharge: 2011-06-14 | Disposition: A | Discharge status: Home / Self Care | Payer: Medicare Other | Source: Ambulatory Visit | Attending: Internal Medicine | Admitting: Internal Medicine

## 2011-06-14 DIAGNOSIS — I82890 Acute embolism and thrombosis of other specified veins: Secondary | ICD-10-CM

## 2011-06-14 DIAGNOSIS — M5137 Other intervertebral disc degeneration, lumbosacral region: Secondary | ICD-10-CM | POA: Diagnosis not present

## 2011-06-14 DIAGNOSIS — M159 Polyosteoarthritis, unspecified: Secondary | ICD-10-CM | POA: Diagnosis not present

## 2011-06-14 DIAGNOSIS — I8000 Phlebitis and thrombophlebitis of superficial vessels of unspecified lower extremity: Secondary | ICD-10-CM | POA: Diagnosis not present

## 2011-06-14 DIAGNOSIS — M5430 Sciatica, unspecified side: Secondary | ICD-10-CM | POA: Diagnosis not present

## 2011-06-14 DIAGNOSIS — I10 Essential (primary) hypertension: Secondary | ICD-10-CM | POA: Diagnosis not present

## 2011-06-21 DIAGNOSIS — I82890 Acute embolism and thrombosis of other specified veins: Secondary | ICD-10-CM | POA: Diagnosis not present

## 2011-06-25 ENCOUNTER — Encounter (HOSPITAL_COMMUNITY): Payer: Self-pay

## 2011-06-25 ENCOUNTER — Emergency Department (HOSPITAL_COMMUNITY): Payer: Medicare Other

## 2011-06-25 ENCOUNTER — Inpatient Hospital Stay (HOSPITAL_COMMUNITY)
Admission: EM | Admit: 2011-06-25 | Discharge: 2011-06-28 | DRG: 395 | Disposition: A | Discharge status: Skilled Nursing Facility | Payer: Medicare Other | Source: Ambulatory Visit | Attending: Internal Medicine | Admitting: Internal Medicine

## 2011-06-25 DIAGNOSIS — I8000 Phlebitis and thrombophlebitis of superficial vessels of unspecified lower extremity: Secondary | ICD-10-CM | POA: Diagnosis present

## 2011-06-25 DIAGNOSIS — R7309 Other abnormal glucose: Secondary | ICD-10-CM | POA: Diagnosis present

## 2011-06-25 DIAGNOSIS — K559 Vascular disorder of intestine, unspecified: Principal | ICD-10-CM | POA: Diagnosis present

## 2011-06-25 DIAGNOSIS — D696 Thrombocytopenia, unspecified: Secondary | ICD-10-CM | POA: Diagnosis not present

## 2011-06-25 DIAGNOSIS — M1990 Unspecified osteoarthritis, unspecified site: Secondary | ICD-10-CM | POA: Diagnosis present

## 2011-06-25 DIAGNOSIS — K649 Unspecified hemorrhoids: Secondary | ICD-10-CM | POA: Diagnosis present

## 2011-06-25 DIAGNOSIS — D649 Anemia, unspecified: Secondary | ICD-10-CM | POA: Diagnosis not present

## 2011-06-25 DIAGNOSIS — K5289 Other specified noninfective gastroenteritis and colitis: Secondary | ICD-10-CM | POA: Diagnosis not present

## 2011-06-25 DIAGNOSIS — I10 Essential (primary) hypertension: Secondary | ICD-10-CM | POA: Diagnosis not present

## 2011-06-25 DIAGNOSIS — Z7982 Long term (current) use of aspirin: Secondary | ICD-10-CM | POA: Diagnosis not present

## 2011-06-25 DIAGNOSIS — Z85048 Personal history of other malignant neoplasm of rectum, rectosigmoid junction, and anus: Secondary | ICD-10-CM | POA: Diagnosis not present

## 2011-06-25 DIAGNOSIS — K625 Hemorrhage of anus and rectum: Secondary | ICD-10-CM | POA: Diagnosis present

## 2011-06-25 DIAGNOSIS — Z905 Acquired absence of kidney: Secondary | ICD-10-CM

## 2011-06-25 DIAGNOSIS — Z79899 Other long term (current) drug therapy: Secondary | ICD-10-CM | POA: Diagnosis not present

## 2011-06-25 DIAGNOSIS — M79609 Pain in unspecified limb: Secondary | ICD-10-CM | POA: Diagnosis not present

## 2011-06-25 DIAGNOSIS — C210 Malignant neoplasm of anus, unspecified: Secondary | ICD-10-CM | POA: Diagnosis not present

## 2011-06-25 DIAGNOSIS — R109 Unspecified abdominal pain: Secondary | ICD-10-CM | POA: Diagnosis not present

## 2011-06-25 DIAGNOSIS — K7689 Other specified diseases of liver: Secondary | ICD-10-CM | POA: Diagnosis not present

## 2011-06-25 DIAGNOSIS — K5730 Diverticulosis of large intestine without perforation or abscess without bleeding: Secondary | ICD-10-CM | POA: Diagnosis present

## 2011-06-25 DIAGNOSIS — R933 Abnormal findings on diagnostic imaging of other parts of digestive tract: Secondary | ICD-10-CM

## 2011-06-25 DIAGNOSIS — K529 Noninfective gastroenteritis and colitis, unspecified: Secondary | ICD-10-CM | POA: Diagnosis not present

## 2011-06-25 DIAGNOSIS — Z85828 Personal history of other malignant neoplasm of skin: Secondary | ICD-10-CM | POA: Diagnosis not present

## 2011-06-25 HISTORY — DX: Malignant neoplasm of anus, unspecified: C21.0

## 2011-06-25 LAB — DIFFERENTIAL
Basophils Relative: 0 % (ref 0–1)
Lymphocytes Relative: 7 % — ABNORMAL LOW (ref 12–46)
Monocytes Relative: 5 % (ref 3–12)
Neutro Abs: 11.5 10*3/uL — ABNORMAL HIGH (ref 1.7–7.7)

## 2011-06-25 LAB — COMPREHENSIVE METABOLIC PANEL
Alkaline Phosphatase: 97 U/L (ref 39–117)
BUN: 25 mg/dL — ABNORMAL HIGH (ref 6–23)
Chloride: 98 mEq/L (ref 96–112)
GFR calc Af Amer: 70 mL/min — ABNORMAL LOW (ref >90)
GFR calc non Af Amer: 60 mL/min — ABNORMAL LOW (ref >90)
Glucose, Bld: 147 mg/dL — ABNORMAL HIGH (ref 70–99)
Potassium: 3.5 mEq/L (ref 3.5–5.1)
Total Bilirubin: 0.5 mg/dL (ref 0.3–1.2)

## 2011-06-25 LAB — URINALYSIS, ROUTINE W REFLEX MICROSCOPIC
Bilirubin Urine: NEGATIVE
Hgb urine dipstick: NEGATIVE
Nitrite: NEGATIVE
Protein, ur: NEGATIVE mg/dL
Specific Gravity, Urine: 1.023 (ref 1.005–1.030)
Urobilinogen, UA: 0.2 mg/dL (ref 0.0–1.0)

## 2011-06-25 LAB — PROTIME-INR: Prothrombin Time: 13.6 seconds (ref 11.6–15.2)

## 2011-06-25 LAB — TYPE AND SCREEN
ABO/RH(D): AB POS
Antibody Screen: NEGATIVE

## 2011-06-25 LAB — HEMOGLOBIN AND HEMATOCRIT, BLOOD: Hemoglobin: 12.5 g/dL (ref 12.0–15.0)

## 2011-06-25 LAB — APTT: aPTT: 28 seconds (ref 24–37)

## 2011-06-25 LAB — LIPASE, BLOOD: Lipase: 13 U/L (ref 11–59)

## 2011-06-25 LAB — ABO/RH: ABO/RH(D): AB POS

## 2011-06-25 LAB — OCCULT BLOOD, POC DEVICE: Fecal Occult Bld: POSITIVE

## 2011-06-25 LAB — CBC
HCT: 38.2 % (ref 36.0–46.0)
Hemoglobin: 13.7 g/dL (ref 12.0–15.0)
MCHC: 35.9 g/dL (ref 30.0–36.0)
MCV: 86.8 fL (ref 78.0–100.0)

## 2011-06-25 MED ORDER — ACETAMINOPHEN 325 MG PO TABS: 650.0000 mg | ORAL_TABLET | Freq: Four times a day (QID) | ORAL | Status: DC | PRN

## 2011-06-25 MED ORDER — METRONIDAZOLE IN NACL 5-0.79 MG/ML-% IV SOLN
500.0000 mg | Freq: Three times a day (TID) | INTRAVENOUS | Status: DC
  Administered 2011-06-25 – 2011-06-27 (×5): 500 mg via INTRAVENOUS
  Filled 2011-06-25 (×8): qty 100

## 2011-06-25 MED ORDER — ONDANSETRON HCL 4 MG PO TABS: 4.0000 mg | ORAL_TABLET | Freq: Four times a day (QID) | ORAL | Status: DC | PRN

## 2011-06-25 MED ORDER — METRONIDAZOLE IN NACL 5-0.79 MG/ML-% IV SOLN
500.0000 mg | Freq: Three times a day (TID) | INTRAVENOUS | Status: DC
  Filled 2011-06-25 (×2): qty 100

## 2011-06-25 MED ORDER — MORPHINE SULFATE 4 MG/ML IJ SOLN: 4.0000 mg | Freq: Once | INTRAMUSCULAR | Status: DC

## 2011-06-25 MED ORDER — SODIUM CHLORIDE 0.9 % IJ SOLN
3.0000 mL | Freq: Two times a day (BID) | INTRAMUSCULAR | Status: DC
  Administered 2011-06-25: 3 mL via INTRAVENOUS

## 2011-06-25 MED ORDER — MORPHINE SULFATE 4 MG/ML IJ SOLN
4.0000 mg | Freq: Once | INTRAMUSCULAR | Status: AC
  Administered 2011-06-25: 4 mg via INTRAVENOUS
  Filled 2011-06-25: qty 1

## 2011-06-25 MED ORDER — IOHEXOL 300 MG/ML  SOLN
20.0000 mL | INTRAMUSCULAR | Status: DC
  Administered 2011-06-25: 20 mL via ORAL

## 2011-06-25 MED ORDER — CALCIUM CARBONATE 1250 (500 CA) MG PO TABS
1.0000 | ORAL_TABLET | Freq: Two times a day (BID) | ORAL | Status: DC
  Administered 2011-06-25 – 2011-06-28 (×6): 500 mg via ORAL
  Filled 2011-06-25 (×8): qty 1

## 2011-06-25 MED ORDER — CALCIUM CARBONATE 600 MG PO TABS
600.0000 mg | ORAL_TABLET | Freq: Two times a day (BID) | ORAL | Status: DC
  Filled 2011-06-25 (×2): qty 1

## 2011-06-25 MED ORDER — CIPROFLOXACIN IN D5W 400 MG/200ML IV SOLN
400.0000 mg | Freq: Once | INTRAVENOUS | Status: AC
  Administered 2011-06-25: 400 mg via INTRAVENOUS
  Filled 2011-06-25: qty 200

## 2011-06-25 MED ORDER — DIAZEPAM 5 MG PO TABS: 5.0000 mg | ORAL_TABLET | Freq: Three times a day (TID) | ORAL | Status: DC | PRN

## 2011-06-25 MED ORDER — ONDANSETRON HCL 4 MG/2ML IJ SOLN
4.0000 mg | Freq: Once | INTRAMUSCULAR | Status: AC
  Administered 2011-06-25: 4 mg via INTRAVENOUS
  Filled 2011-06-25: qty 2

## 2011-06-25 MED ORDER — SODIUM CHLORIDE 0.9 % IV SOLN
INTRAVENOUS | Status: DC
  Administered 2011-06-25 – 2011-06-26 (×3): via INTRAVENOUS
  Administered 2011-06-27: 1000 mL via INTRAVENOUS

## 2011-06-25 MED ORDER — METRONIDAZOLE IN NACL 5-0.79 MG/ML-% IV SOLN
500.0000 mg | Freq: Once | INTRAVENOUS | Status: AC
  Administered 2011-06-25: 500 mg via INTRAVENOUS
  Filled 2011-06-25: qty 100

## 2011-06-25 MED ORDER — CIPROFLOXACIN IN D5W 400 MG/200ML IV SOLN
400.0000 mg | Freq: Two times a day (BID) | INTRAVENOUS | Status: DC
  Administered 2011-06-26 – 2011-06-27 (×3): 400 mg via INTRAVENOUS
  Filled 2011-06-25 (×4): qty 200

## 2011-06-25 MED ORDER — SODIUM CHLORIDE 0.9 % IV SOLN: INTRAVENOUS | Status: DC

## 2011-06-25 MED ORDER — SODIUM CHLORIDE 0.9 % IV SOLN
INTRAVENOUS | Status: DC
  Administered 2011-06-25: 10:00:00 via INTRAVENOUS
  Administered 2011-06-27: 1000 mL via INTRAVENOUS
  Administered 2011-06-28: 05:00:00 via INTRAVENOUS

## 2011-06-25 MED ORDER — HYDROMORPHONE HCL PF 1 MG/ML IJ SOLN
0.5000 mg | INTRAMUSCULAR | Status: DC | PRN
  Administered 2011-06-26 – 2011-06-28 (×4): 0.5 mg via INTRAVENOUS
  Filled 2011-06-25 (×4): qty 1

## 2011-06-25 MED ORDER — SERTRALINE HCL 50 MG PO TABS
50.0000 mg | ORAL_TABLET | Freq: Every day | ORAL | Status: DC
  Administered 2011-06-25 – 2011-06-28 (×4): 50 mg via ORAL
  Filled 2011-06-25 (×4): qty 1

## 2011-06-25 MED ORDER — ACETAMINOPHEN 650 MG RE SUPP: 650.0000 mg | Freq: Four times a day (QID) | RECTAL | Status: DC | PRN

## 2011-06-25 MED ORDER — ONDANSETRON HCL 4 MG/2ML IJ SOLN: 4.0000 mg | Freq: Four times a day (QID) | INTRAMUSCULAR | Status: DC | PRN

## 2011-06-25 MED ORDER — ADULT MULTIVITAMIN W/MINERALS CH
1.0000 | ORAL_TABLET | Freq: Every day | ORAL | Status: DC
  Administered 2011-06-25 – 2011-06-28 (×4): 1 via ORAL
  Filled 2011-06-25 (×4): qty 1

## 2011-06-25 MED ORDER — CIPROFLOXACIN IN D5W 400 MG/200ML IV SOLN
400.0000 mg | Freq: Two times a day (BID) | INTRAVENOUS | Status: DC
  Filled 2011-06-25 (×2): qty 200

## 2011-06-25 MED ORDER — IOHEXOL 300 MG/ML  SOLN
80.0000 mL | Freq: Once | INTRAMUSCULAR | Status: AC | PRN
  Administered 2011-06-25: 80 mL via INTRAVENOUS

## 2011-06-25 NOTE — ED Provider Notes (Signed)
History     CSN: [id number]  Arrival date & time 06/25/11  2440   First MD Initiated Contact with Patient 06/25/11 [phone]      Chief Complaint  Patient presents with  . Rectal Bleeding  . Abdominal Pain    (Consider location/radiation/quality/duration/timing/severity/associated sxs/prior treatment) HPI Comments: Patient is a 78 year old woman who had onset of vomiting last night. Then she developed abdominal pain, and past blood per rectum. She said this occurs several times during the night and this morning. She therefore seeks evaluation. She has a prior history of squamous cell carcinoma of the anus, treated with radiation and chemotherapy. This was back in 2004.  Patient is a 78 y.o. female presenting with hematochezia. The history is provided by the patient and medical records. No language interpreter was used.  Rectal Bleeding  The current episode started today. Episode frequency: Bleeding last night, persisting today. The problem has been unchanged. The pain is moderate. The stool is described as bloody. There was no prior successful therapy. There was no prior unsuccessful therapy. Associated symptoms include abdominal pain and rectal pain. Pertinent negatives include no fever. Associated symptoms comments: Lower abdominal pain. She has been eating and drinking normally. Past medical history comments: Squamous cell carcinoma of the anus. There were no sick contacts. She has received no recent medical care.    Past Medical History  Diagnosis Date  . Colon cancer     Anus  . Hypertension   . OA (osteoarthritis)   . Squamous cell carcinoma     skin  . Anal fissure   . Colon, diverticulosis     sigmoid    Past Surgical History  Procedure Date  . Nephrectomy     Rt.  Marland Kitchen Abdominal hysterectomy   . Bladder repair   . Polypectomy   . Appendectomy     Family History  Problem Relation Age of Onset  . Coronary artery disease Father   . Leukemia Brother   . Colon cancer  Brother   . Breast cancer Sister   . Ovarian cancer Sister   . Diabetes Daughter     History  Substance Use Topics  . Smoking status: Never Smoker   . Smokeless tobacco: Never Used  . Alcohol Use: No    OB History    Grav Para Term Preterm Abortions TAB SAB Ect Mult Living                  Review of Systems  Constitutional: Negative.  Negative for fever and chills.  HENT: Negative.   Eyes: Negative.   Respiratory: Negative.   Cardiovascular: Negative.   Gastrointestinal: Positive for abdominal pain, hematochezia, anal bleeding and rectal pain.  Genitourinary: Negative.   Musculoskeletal: Negative.   Skin: Negative.   Neurological: Negative.   Psychiatric/Behavioral: Negative.     Allergies  Review of patient's allergies indicates no known allergies.  Home Medications   Current Outpatient Rx  Name Route Sig Dispense Refill  . ASPIRIN 325 MG PO TABS Oral Take 325 mg by mouth daily.    Marland Kitchen CALCIUM CARBONATE 600 MG PO TABS Oral Take 600 mg by mouth 2 (two) times daily with a meal.      . DIAZEPAM 5 MG PO TABS Oral Take 5 mg by mouth every 6 (six) hours as needed. For anxiety    . GINKGO BILOBA 120 MG PO TABS Oral Take 1 tablet by mouth daily.      Marland Kitchen LISINOPRIL-HYDROCHLOROTHIAZIDE 10-12.5 MG PO TABS  Oral Take 1 tablet by mouth daily.      . OSTEO BI-FLEX ADV JOINT SHIELD PO Oral Take 1 tablet by mouth daily.      . ADULT MULTIVITAMIN W/MINERALS CH Oral Take 1 tablet by mouth daily.    Marland Kitchen FISH OIL 1000 MG PO CAPS Oral Take 1 capsule by mouth daily.      . SERTRALINE HCL 50 MG PO TABS Oral Take 50 mg by mouth daily.      Marland Kitchen VITAMIN C 500 MG PO TABS Oral Take 500 mg by mouth daily.      Marland Kitchen VITAMIN E 200 UNITS PO CAPS Oral Take 200 Units by mouth 3 (three) times daily.        BP 139/62  Pulse 113  Temp(Src) 98.1 F (36.7 C) (Oral)  Resp 18  SpO2 98%  Physical Exam  Nursing note and vitals reviewed. Constitutional: She is oriented to person, place, and time. She appears  well-developed and well-nourished.       Moderate Distress, complaining of abdominal pain.  HENT:  Head: Normocephalic and atraumatic.  Right Ear: External ear normal.  Left Ear: External ear normal.  Mouth/Throat: Oropharynx is clear and moist.  Eyes: Conjunctivae and EOM are normal. Pupils are equal, round, and reactive to light.  Neck: Normal range of motion. Neck supple.  Cardiovascular: Normal rate, regular rhythm and normal heart sounds.   Pulmonary/Chest: Effort normal and breath sounds normal.  Abdominal: Soft. Bowel sounds are normal.       Abdominal exam shows no mass or point tenderness. She localizes the pain to the suprapubic region.  Genitourinary:       O. exam shows a tight rectal stricture, digital exam accomplished with difficulty due to stricture.  Stool has mucus and blood.  Musculoskeletal: Normal range of motion.  Neurological: She is alert and oriented to person, place, and time.       No motor or sensory deficit.  Skin: Skin is warm and dry.  Psychiatric: She has a normal mood and affect. Her behavior is normal.    ED Course  Procedures (including critical care time)   Labs Reviewed  URINALYSIS, ROUTINE W REFLEX MICROSCOPIC  CBC  DIFFERENTIAL  COMPREHENSIVE METABOLIC PANEL  LIPASE, BLOOD  URINE CULTURE  APTT  PROTIME-INR   9:28 AM Pt seen --> physical exam performed.  Lab workup ordered.  CT of abdomen/pelvis ordered.  IV fluids, IV medications for pain and nausea ordered.  Old charts reviewed.    9:58 AM  Date: 06/25/2011  Rate: 100  Rhythm: sinus tachycardia  QRS Axis: right  Intervals: QT prolonged  ST/T Wave abnormalities: normal  Conduction Disutrbances:right bundle branch block and left posterior fascicular block  Narrative Interpretation: Abnormal EKG.  Old EKG Reviewed: changes noted--Has developed R axix, RBBB and LPFB since tracing of 06/09/2004.  1:40 PM Results for orders placed during the hospital encounter of 06/25/11    URINALYSIS, ROUTINE W REFLEX MICROSCOPIC      Component Value Range   Color, Urine YELLOW  YELLOW    APPearance CLEAR  CLEAR    Specific Gravity, Urine 1.023  1.005 - 1.030    pH 6.0  5.0 - 8.0    Glucose, UA NEGATIVE  NEGATIVE (mg/dL)   Hgb urine dipstick NEGATIVE  NEGATIVE    Bilirubin Urine NEGATIVE  NEGATIVE    Ketones, ur NEGATIVE  NEGATIVE (mg/dL)   Protein, ur NEGATIVE  NEGATIVE (mg/dL)   Urobilinogen, UA 0.2  0.0 -  1.0 (mg/dL)   Nitrite NEGATIVE  NEGATIVE    Leukocytes, UA NEGATIVE  NEGATIVE   CBC      Component Value Range   WBC 13.1 (*) 4.0 - 10.5 (K/uL)   RBC 4.40  3.87 - 5.11 (MIL/uL)   Hemoglobin 13.7  12.0 - 15.0 (g/dL)   HCT 16.1  09.6 - 04.5 (%)   MCV 86.8  78.0 - 100.0 (fL)   MCH 31.1  26.0 - 34.0 (pg)   MCHC 35.9  30.0 - 36.0 (g/dL)   RDW 40.9  81.1 - 91.4 (%)   Platelets 171  150 - 400 (K/uL)  DIFFERENTIAL      Component Value Range   Neutrophils Relative 88 (*) 43 - 77 (%)   Lymphocytes Relative 7 (*) 12 - 46 (%)   Monocytes Relative 5  3 - 12 (%)   Eosinophils Relative 0  0 - 5 (%)   Basophils Relative 0  0 - 1 (%)   Neutro Abs 11.5 (*) 1.7 - 7.7 (K/uL)   Lymphs Abs 0.9  0.7 - 4.0 (K/uL)   Monocytes Absolute 0.7  0.1 - 1.0 (K/uL)   Eosinophils Absolute 0.0  0.0 - 0.7 (K/uL)   Basophils Absolute 0.0  0.0 - 0.1 (K/uL)   WBC Morphology INCREASED BANDS (>20% BANDS)    COMPREHENSIVE METABOLIC PANEL      Component Value Range   Sodium 135  135 - 145 (mEq/L)   Potassium 3.5  3.5 - 5.1 (mEq/L)   Chloride 98  96 - 112 (mEq/L)   CO2 24  19 - 32 (mEq/L)   Glucose, Bld 147 (*) 70 - 99 (mg/dL)   BUN 25 (*) 6 - 23 (mg/dL)   Creatinine, Ser 7.82  0.50 - 1.10 (mg/dL)   Calcium 9.7  8.4 - 95.6 (mg/dL)   Total Protein 6.1  6.0 - 8.3 (g/dL)   Albumin 3.7  3.5 - 5.2 (g/dL)   AST 20  0 - 37 (U/L)   ALT 25  0 - 35 (U/L)   Alkaline Phosphatase 97  39 - 117 (U/L)   Total Bilirubin 0.5  0.3 - 1.2 (mg/dL)   GFR calc non Af Amer 60 (*) >90 (mL/min)   GFR calc Af  Amer 70 (*) >90 (mL/min)  LIPASE, BLOOD      Component Value Range   Lipase 13  11 - 59 (U/L)  APTT      Component Value Range   aPTT 28  24 - 37 (seconds)  PROTIME-INR      Component Value Range   Prothrombin Time 13.6  11.6 - 15.2 (seconds)   INR 1.02  0.00 - 1.49   OCCULT BLOOD, POC DEVICE      Component Value Range   Fecal Occult Bld POSITIVE     Nm Bone Scan Whole Body  05/29/2011  *RADIOLOGY REPORT*  Clinical Data: Colon cancer.  Evaluate bone metastasis.  NUCLEAR MEDICINE WHOLE BODY BONE SCINTIGRAPHY  Technique:  Whole body anterior and posterior images were obtained approximately 3 hours after intravenous injection of radiopharmaceutical.  Radiopharmaceutical: CURIE TC-MDP TECHNETIUM TC 30M MEDRONATE IV KIT  Comparison: None.  Findings: Again noted are several areas of increased radiotracer uptake within the axial skeleton consistent with bone metastasis. When compared with the previous exam there is been interval improvement and asymmetric increased uptake within the right anterior iliac spine. Small focus of increased uptake within the approximate T6 vertebra is identified.  This is stable to  improved in the interval.  There are no new areas of increased or decreased radiotracer uptake.  There is normal physiologic tracer activity within the left kidney and urinary bladder.  Degenerative type changes are noted within the left knee.  IMPRESSION:  1.  Stable to improved multifocal bone metastasis. 2.  No evidence for new or progressive disease.  Original Report Authenticated By: Rosealee Albee, M.D.   Ct Abdomen Pelvis W Contrast  06/25/2011  *RADIOLOGY REPORT*  Clinical Data: Left-sided abdominal pain, rectal bleeding  CT ABDOMEN AND PELVIS WITH CONTRAST  Technique:  Multidetector CT imaging of the abdomen and pelvis was performed following the standard protocol during bolus administration of intravenous contrast.  Contrast: 80mL OMNIPAQUE IOHEXOL 300 MG/ML  SOLN  Comparison:  05/02/2005  Findings: Lung bases shows stable pleural based nodules right base anteriorly and laterally.  Stable atelectasis or scarring in the left base anteriorly.  Sagittal images of the spine shows diffuse osteopenia. Degenerative changes thoracolumbar spine. Disc space flattening at L4-L5 and L5 S1 level.  Enhanced liver shows no biliary ductal dilatation.  Low density lesion probable hemangioma left hepatic dome measures 1.2 cm stable in size and appearance from prior exam.  Spleen, pancreas and adrenals are unremarkable.  No calcified gallstones are noted within gallbladder.  Again noted status post right nephrectomy. Normal enhancement of the left kidney.  No left hydronephrosis or hydroureter.  Small amount of contrast material is noted in distal esophagus probable from gastroesophageal reflux disease.  Nonspecific mild thickening of distal esophageal wall.  Moderate stool noted in the right colon and proximal transverse colon.  There is abnormal thickening of the descending colon starting from the splenic flexure.  Colonic wall thickness measures 1.3 cm. There is mild stranding of the surrounding fat.  There is also thickening of the wall of the sigmoid colon.  Findings are consistent with a long segment colitis.  No pericolonic abscess or definite perforation is noted.  Scattered sigmoid colon diverticula are noted.  The urinary bladder is unremarkable.  The terminal ileum is unremarkable.  No small bowel obstruction.  No ascites or free air.  No destructive bony lesions are noted within pelvis.  Degenerative changes bilateral SI joints.  IMPRESSION:  1.  There is significant thickening of the descending colon wall. Mild surrounding inflammatory changes.  There is also thickening of sigmoid colon wall.  Findings are consistent with long segment colitis.  2.  No pericolonic abscess or perforation is noted.  Moderate stool is noted in proximal colon.  No small bowel obstruction. 3.  Surgically absent right  kidney.  Unremarkable left kidney. 4.  Stable low density lesion in the left hepatic dome probable benign in nature.  Original Report Authenticated By: Natasha Mead, M.D.   US Venous Img Lower Unilateral Left  06/14/2011  *RADIOLOGY REPORT*  Clinical Data: Acute venous embolism and thrombosis of other specified veins  ? dvt;;  LEFT LOWER EXTREMITY VENOUS DUPLEX ULTRASOUND  Technique: Gray-scale sonography with compression, as well as color and duplex ultrasound, were performed to evaluate the deep venous system from the level of the common femoral vein through the popliteal and proximal calf veins.  Comparison: None  Findings:  Normal compressibility and normal Doppler signal within the common femoral, superficial femoral and popliteal veins, down to the proximal calf veins.  No grayscale filling defects to suggest DVT.  Thrombosis is noted in the left greater saphenous vein from the proximal thigh to the ankle.  IMPRESSION: No evidence of  left lower extremity deep vein thrombosis.  Left greater saphenous vein thrombophlebitis.  Original Report Authenticated By: Cyndie Chime, M.D.    CT of the abdomen shows a long segment of colitis.  Call to [facility] to admit her. 2:42 PM  Case discussed with dr. Cleotis Lema --> admit to a telemetry bed.    1. Colitis          `````````````````````````````````````````````````````````  Carleene Cooper III, MD 06/25/11 [phone]

## 2011-06-25 NOTE — H&P (Signed)
PCP:  Georgianne Fick, MD, MD   DOA:  06/25/2011  8:40 AM  Chief Complaint:  Rectal bleeding/abdominal pain  HPI: 78 Years old Caucasian woman with history of rectal cancer followed by Dr Clelia Croft status post chemotherapy. She presented to the ER today with chief complaint of rectal bleeding and abdominal pain started last evening and continued this morning. She describes her abdominal pain is left lower cord drip, intermittent, denies any nausea however she vomited once yesterday which he described as nonbloody. She described her rectal bleeding as dark red blood, intermittent and moderate amount. In the ED CT scan showed colitis, patient was given Flagyl and ciprofloxacin and we were consulted to admit for further management  Allergies: No Known Allergies  Prior to Admission medications   Medication Sig Start Date End Date Taking? Authorizing Provider  aspirin 325 MG tablet Take 325 mg by mouth daily.   Yes Historical Provider, MD  calcium carbonate (OS-CAL) 600 MG TABS Take 600 mg by mouth 2 (two) times daily with a meal.     Yes Historical Provider, MD  diazepam (VALIUM) 5 MG tablet Take 5 mg by mouth every 6 (six) hours as needed. For anxiety   Yes Historical Provider, MD  Ginkgo Biloba 120 MG TABS Take 1 tablet by mouth daily.     Yes Historical Provider, MD  lisinopril-hydrochlorothiazide (PRINZIDE,ZESTORETIC) 10-12.5 MG per tablet Take 1 tablet by mouth daily.     Yes Historical Provider, MD  Misc Natural Products (OSTEO BI-FLEX ADV JOINT SHIELD PO) Take 1 tablet by mouth daily.     Yes Historical Provider, MD  Multiple Vitamin (MULITIVITAMIN WITH MINERALS) TABS Take 1 tablet by mouth daily.   Yes Historical Provider, MD  Omega-3 Fatty Acids (FISH OIL) 1000 MG CAPS Take 1 capsule by mouth daily.     Yes Historical Provider, MD  sertraline (ZOLOFT) 50 MG tablet Take 50 mg by mouth daily.     Yes Historical Provider, MD  vitamin C (ASCORBIC ACID) 500 MG tablet Take 500 mg by mouth daily.      Yes Historical Provider, MD  vitamin E 200 UNIT capsule Take 200 Units by mouth 3 (three) times daily.     Yes Historical Provider, MD    Past Medical History  Diagnosis Date  . Colon cancer     Anus  . Hypertension   . OA (osteoarthritis)   . Squamous cell carcinoma     skin  . Anal fissure   . Colon, diverticulosis     sigmoid    Past Surgical History  Procedure Date  . Nephrectomy     Rt.  Marland Kitchen Abdominal hysterectomy   . Bladder repair   . Polypectomy   . Appendectomy     Social History:  She lives alone, denies smoking, EtOH drinking or illicit drug use  .  Family History  Problem Relation Age of Onset  . Coronary artery disease Father   . Leukemia Brother   . Colon cancer Brother   . Breast cancer Sister   . Ovarian cancer Sister   . Diabetes Daughter     Review of Systems: As above in history of present illness Constitutional: Denies fever, chills, diaphoresis, appetite change and fatigue.  HEENT: Denies photophobia, eye pain, redness, hearing loss, ear pain, congestion, sore throat, rhinorrhea, sneezing, mouth sores, trouble swallowing, neck pain, neck stiffness and tinnitus.   Respiratory: Denies SOB, DOE, cough, chest tightness,  and wheezing.   Cardiovascular: Denies chest pain, palpitations and  leg swelling.  Gastrointestinal: c/o vomiting, abdominal pain,blood in stool  denies diarrhea,  Genitourinary: Denies dysuria, urgency, frequency, hematuria, flank pain and difficulty urinating.   Neurological: Denies dizziness, seizures, syncope, weakness, light-headedness, numbness and headaches.     Physical Exam:  Filed Vitals:   06/25/11 0845 06/25/11 0938 06/25/11 1444  BP: 139/62 144/72 116/55  Pulse: 113 100 88  Temp: 98.1 F (36.7 C)    TempSrc: Oral    Resp: 18 20 18   SpO2: 98% 97% 96%    Constitutional: Vital signs reviewed.  Patient is  in no acute distress and cooperative with exam. Alert and oriented x3.  Eyes: PERRL, EOMI, conjunctivae  normal, No scleral icterus.  Neck: Supple, Trachea midline normal ROM, No JVD, mass, thyromegaly, or carotid bruit present.  Cardiovascular: RRR, S1 normal, S2 normal, no MRG, pulses symmetric and intact bilaterally Pulmonary/Chest: CTAB, no wheezes, rales, or rhonchi Abdominal: Soft. Left lower quadrant tenderness, non-distended, bowel sounds are normal, no masses, organomegaly, or guarding present.  Ext: no edema and no cyanosis, pulses palpable bilaterally  Neurological: A&O x3, Strenght is normal and symmetric bilaterally, cranial nerve II-XII are grossly intact, no focal motor deficit, sensory intact to light touch bilaterally.    Labs on Admission:  Results for orders placed during the hospital encounter of 06/25/11 (from the past 48 hour(s))  CBC     Status: Abnormal   Collection Time   06/25/11  9:28 AM      Component Value Range Comment   WBC 13.1 (*) 4.0 - 10.5 (K/uL)    RBC 4.40  3.87 - 5.11 (MIL/uL)    Hemoglobin 13.7  12.0 - 15.0 (g/dL)    HCT 78.2  95.6 - 21.3 (%)    MCV 86.8  78.0 - 100.0 (fL)    MCH 31.1  26.0 - 34.0 (pg)    MCHC 35.9  30.0 - 36.0 (g/dL)    RDW 08.6  57.8 - 46.9 (%)    Platelets 171  150 - 400 (K/uL)   DIFFERENTIAL     Status: Abnormal   Collection Time   06/25/11  9:28 AM      Component Value Range Comment   Neutrophils Relative 88 (*) 43 - 77 (%)    Lymphocytes Relative 7 (*) 12 - 46 (%)    Monocytes Relative 5  3 - 12 (%)    Eosinophils Relative 0  0 - 5 (%)    Basophils Relative 0  0 - 1 (%)    Neutro Abs 11.5 (*) 1.7 - 7.7 (K/uL)    Lymphs Abs 0.9  0.7 - 4.0 (K/uL)    Monocytes Absolute 0.7  0.1 - 1.0 (K/uL)    Eosinophils Absolute 0.0  0.0 - 0.7 (K/uL)    Basophils Absolute 0.0  0.0 - 0.1 (K/uL)    WBC Morphology INCREASED BANDS (>20% BANDS)     COMPREHENSIVE METABOLIC PANEL     Status: Abnormal   Collection Time   06/25/11  9:28 AM      Component Value Range Comment   Sodium 135  135 - 145 (mEq/L)    Potassium 3.5  3.5 - 5.1 (mEq/L)     Chloride 98  96 - 112 (mEq/L)    CO2 24  19 - 32 (mEq/L)    Glucose, Bld 147 (*) 70 - 99 (mg/dL)    BUN 25 (*) 6 - 23 (mg/dL)    Creatinine, Ser 6.29  0.50 - 1.10 (mg/dL)  Calcium 9.7  8.4 - 10.5 (mg/dL)    Total Protein 6.1  6.0 - 8.3 (g/dL)    Albumin 3.7  3.5 - 5.2 (g/dL)    AST 20  0 - 37 (U/L)    ALT 25  0 - 35 (U/L)    Alkaline Phosphatase 97  39 - 117 (U/L)    Total Bilirubin 0.5  0.3 - 1.2 (mg/dL)    GFR calc non Af Amer 60 (*) >90 (mL/min)    GFR calc Af Amer 70 (*) >90 (mL/min)   LIPASE, BLOOD     Status: Normal   Collection Time   06/25/11  9:28 AM      Component Value Range Comment   Lipase 13  11 - 59 (U/L)   APTT     Status: Normal   Collection Time   06/25/11  9:28 AM      Component Value Range Comment   aPTT 28  24 - 37 (seconds)   PROTIME-INR     Status: Normal   Collection Time   06/25/11  9:28 AM      Component Value Range Comment   Prothrombin Time 13.6  11.6 - 15.2 (seconds)    INR 1.02  0.00 - 1.49    OCCULT BLOOD, POC DEVICE     Status: Normal   Collection Time   06/25/11  9:29 AM      Component Value Range Comment   Fecal Occult Bld POSITIVE     URINALYSIS, ROUTINE W REFLEX MICROSCOPIC     Status: Normal   Collection Time   06/25/11 10:28 AM      Component Value Range Comment   Color, Urine YELLOW  YELLOW     APPearance CLEAR  CLEAR     Specific Gravity, Urine 1.023  1.005 - 1.030     pH 6.0  5.0 - 8.0     Glucose, UA NEGATIVE  NEGATIVE (mg/dL)    Hgb urine dipstick NEGATIVE  NEGATIVE     Bilirubin Urine NEGATIVE  NEGATIVE     Ketones, ur NEGATIVE  NEGATIVE (mg/dL)    Protein, ur NEGATIVE  NEGATIVE (mg/dL)    Urobilinogen, UA 0.2  0.0 - 1.0 (mg/dL)    Nitrite NEGATIVE  NEGATIVE     Leukocytes, UA NEGATIVE  NEGATIVE  MICROSCOPIC NOT DONE ON URINES WITH NEGATIVE PROTEIN, BLOOD, LEUKOCYTES, NITRITE, OR GLUCOSE <1000 mg/dL.    Radiological Exams on Admission: Ct Abdomen Pelvis W Contrast  06/25/2011  *RADIOLOGY REPORT*  Clinical Data: Left-sided  abdominal pain, rectal bleeding  CT ABDOMEN AND PELVIS WITH CONTRAST  Technique:  Multidetector CT imaging of the abdomen and pelvis was performed following the standard protocol during bolus administration of intravenous contrast.  Contrast: 80mL OMNIPAQUE IOHEXOL 300 MG/ML  SOLN  Comparison: 05/02/2005  Findings: Lung bases shows stable pleural based nodules right base anteriorly and laterally.  Stable atelectasis or scarring in the left base anteriorly.  Sagittal images of the spine shows diffuse osteopenia. Degenerative changes thoracolumbar spine. Disc space flattening at L4-L5 and L5 S1 level.  Enhanced liver shows no biliary ductal dilatation.  Low density lesion probable hemangioma left hepatic dome measures 1.2 cm stable in size and appearance from prior exam.  Spleen, pancreas and adrenals are unremarkable.  No calcified gallstones are noted within gallbladder.  Again noted status post right nephrectomy. Normal enhancement of the left kidney.  No left hydronephrosis or hydroureter.  Small amount of contrast material is noted in distal esophagus  probable from gastroesophageal reflux disease.  Nonspecific mild thickening of distal esophageal wall.  Moderate stool noted in the right colon and proximal transverse colon.  There is abnormal thickening of the descending colon starting from the splenic flexure.  Colonic wall thickness measures 1.3 cm. There is mild stranding of the surrounding fat.  There is also thickening of the wall of the sigmoid colon.  Findings are consistent with a long segment colitis.  No pericolonic abscess or definite perforation is noted.  Scattered sigmoid colon diverticula are noted.  The urinary bladder is unremarkable.  The terminal ileum is unremarkable.  No small bowel obstruction.  No ascites or free air.  No destructive bony lesions are noted within pelvis.  Degenerative changes bilateral SI joints.  IMPRESSION:  1.  There is significant thickening of the descending colon wall.  Mild surrounding inflammatory changes.  There is also thickening of sigmoid colon wall.  Findings are consistent with long segment colitis.  2.  No pericolonic abscess or perforation is noted.  Moderate stool is noted in proximal colon.  No small bowel obstruction. 3.  Surgically absent right kidney.  Unremarkable left kidney. 4.  Stable low density lesion in the left hepatic dome probable benign in nature.  Original Report Authenticated By: Natasha Mead, M.D.    Assessment/Plan Principal Problem:  *Rectal bleeding/ Colitis Patient is hemodynamically stable. Admit to telemetry. type and screen, monitor H&H every 8 hours Send stool for C. difficile, ova/parasites and culture. Continue IV Flagyl and ciprofloxacin. Continue IV fluids. Dr.Pyrtle with [facility]  was consulted Active Problems:  History of rectal or anal cancer: Status post chemotherapy, followed by Dr Clelia Croft .as an outpatient , he was seen by him in the office last month for possible metastatic vs benign bony lesions .currently being observed.   HTN (hypertension): Hold antihypertensive medications for now.  DVT prophylaxis: SCDs CODE STATUS: Full code   Time Spent on Admission: Approximately  45 minutes  Lizza Huffaker 06/25/2011, 3:04 PM

## 2011-06-25 NOTE — Progress Notes (Signed)
Pt admitted to unit 5500 on 06/25/11 after presenting to the ED with rectal bleeding.  Pt is from home alone and is alert and oriented x4.  Pt is on telemetry running normal sinus rhythm, and lungs are clear to auscultation.  Skin is intact with no evidence of breakdown.  Pt oriented to the unit and call system.  Safety video viewed, and fall prevention safety plan signed by patient.  Plan of care includes IV antibiotics and stool sample to be sent to the lab.  Will continue to treat per MD orders.

## 2011-06-25 NOTE — ED Notes (Signed)
Patient presents with constant episodes of dark bloody stools last night with 1 episode of vomiting.  Patient also reporting LLQ abdominal pain without radiation, denies urinary symptoms.

## 2011-06-26 ENCOUNTER — Encounter (HOSPITAL_COMMUNITY): Payer: Self-pay | Admitting: Physician Assistant

## 2011-06-26 DIAGNOSIS — K529 Noninfective gastroenteritis and colitis, unspecified: Secondary | ICD-10-CM | POA: Diagnosis not present

## 2011-06-26 DIAGNOSIS — K5289 Other specified noninfective gastroenteritis and colitis: Secondary | ICD-10-CM | POA: Diagnosis not present

## 2011-06-26 DIAGNOSIS — Z85048 Personal history of other malignant neoplasm of rectum, rectosigmoid junction, and anus: Secondary | ICD-10-CM | POA: Diagnosis not present

## 2011-06-26 DIAGNOSIS — R933 Abnormal findings on diagnostic imaging of other parts of digestive tract: Secondary | ICD-10-CM | POA: Diagnosis not present

## 2011-06-26 DIAGNOSIS — M79609 Pain in unspecified limb: Secondary | ICD-10-CM

## 2011-06-26 DIAGNOSIS — C210 Malignant neoplasm of anus, unspecified: Secondary | ICD-10-CM | POA: Diagnosis not present

## 2011-06-26 DIAGNOSIS — K625 Hemorrhage of anus and rectum: Secondary | ICD-10-CM | POA: Diagnosis not present

## 2011-06-26 DIAGNOSIS — I10 Essential (primary) hypertension: Secondary | ICD-10-CM | POA: Diagnosis not present

## 2011-06-26 LAB — URINE CULTURE
Colony Count: NO GROWTH
Culture  Setup Time: 201304071725
Culture: NO GROWTH

## 2011-06-26 LAB — HEMOGLOBIN AND HEMATOCRIT, BLOOD: HCT: 34.5 % — ABNORMAL LOW (ref 36.0–46.0)

## 2011-06-26 MED ORDER — HYDROCORTISONE ACETATE 25 MG RE SUPP
25.0000 mg | Freq: Two times a day (BID) | RECTAL | Status: DC
  Administered 2011-06-26 – 2011-06-28 (×5): 25 mg via RECTAL
  Filled 2011-06-26 (×7): qty 1

## 2011-06-26 MED ORDER — OXYCODONE-ACETAMINOPHEN 5-325 MG PO TABS
1.0000 | ORAL_TABLET | ORAL | Status: DC | PRN
  Administered 2011-06-26: 2 via ORAL
  Administered 2011-06-27: 1 via ORAL
  Filled 2011-06-26: qty 2
  Filled 2011-06-26: qty 1

## 2011-06-26 NOTE — Progress Notes (Signed)
ABI completed.  Preliminary report is >1 on the right with abnormal Doppler waveforms noted in the right anterior tibial artery.  ABI is 0.87 on the left.

## 2011-06-26 NOTE — Consult Note (Signed)
I have taken a history, examined the patient and reviewed the chart. Segmental colitis which is most likely ischemic colitis. Supportive management-hydration, bowel rest, pain control. No plans for sigmoidoscopy at this time. I agree with the extender's note, impression and recommendations.  Meryl Dare MD [facility]

## 2011-06-26 NOTE — Consult Note (Signed)
[facility] Consult: 11:56 AM 06/26/2011   Referring Provider: Cleotis Blair Primary Care Physician:  Shannon Fick, MD, MD Primary Gastroenterologist:  Shannon. Yancey Blair  Reason for Consultation:  Rectal bleeding, abd pain and descending colitis on CT  HPI: Shannon Blair is a 78 y.o. female.  Hx squamous cell cancer of anus in 2004.  Treated with chemo-radiation, no surgery. Hx rectal fissure and internal hemorrhoids on 2002 colonoscopy (Shannon Shannon Blair).  Had colonoscopy limited to sigmoid colon due to stenosing diverticulosis.  Anal stricture noted.  Rectal biopsies negative for recurrent cancer, though PET scan positive for rectal and boney activity.  Per Shannon Shannon Blair 06/01/2011 notes: " At this time I continue to really see no evidence of any malignancy".   " I discussed the option with Shannon Blair of continued observation and repeat imaging studies in 12 months versus biopsy. She declined a biopsy at this point given the fact that she is clinically doing well and has not had any deterioration. Continue close observation and will biopsy if we see increase in her diffuse disease or any other symptomatology".    Now admitted with abdominal pain and rectal bleeding. She ate grilled hamburger and hotdog at her daughter's around 3 o'clock   Sxs began 06/24/11 in evening.  Pain locus of LLQ.  She vomitted once, partially digested food.  Failed attempts to pass stool but began passing red blood, mostly small quantities but larger volume of bleeding later that PM.  Pain persisted through early AM and she came to ED.  Hgb was 13.7, down to 11.4 today.  WBCs 13.1.  Coags and LFTs normal .   CT abdomen showing thickening in descending colon with associated mild inflammation. "Findings are c/w long segment colitis."    I reviewed Ct with Shannon Blair and he say she has age appropriate.non-flow-llimiting aortic vascular disease, some plaque in the CFA.  Shannon Shannon Blair has orders in  place to r/o C Diff but pt has had no stool for at least 4 days now.    Pt suffers from constipation and uses one to 2 stool softeners daily. Generally has BM every 1 to 2 days, albeit always small volume of brown, formed stools.  Has not seen scant blood per rectum in at least 12 months.  Late last week she failed to have BM for 3 days, Satrurday April 6th was the 4th day without a BM.  She has not had any fecal material yet, despite passing the blood.  Appetite is generally good, weight stable.  No nausea except the once on Saturday PM.    Past Medical History  Diagnosis Date  . Anal cancer 2004    Non-surgical mgt with chemo-radiation.  Shannon Blair follows.   . Hypertension   . OA (osteoarthritis)   . Squamous cell carcinoma     skin  . Anal fissure   . Colon, diverticulosis     Sigmoid. With associated stenosis.  Colonoscopy failed to intubate beyond stenosis in 08/2010.  rectal bx then negative for recurrent cancer.     Past Surgical History  Procedure Date  . Nephrectomy 1970's    Rt. "Shrivelled up" ,  no cancer.   . Abdominal hysterectomy   . Bladder repair   . Polypectomy   . Appendectomy   . Cataract extraction 05/2004     Shannon Blair    Prior to Admission medications   Medication Sig Start Date End Date Taking? Authorizing Provider  aspirin 325 MG tablet Take 325 mg  by mouth daily.   Yes Historical Provider, MD  calcium carbonate (OS-CAL) 600 MG TABS Take 600 mg by mouth 2 (two) times daily with a meal.     Yes Historical Provider, MD  diazepam (VALIUM) 5 MG tablet Take 5 mg by mouth every 6 (six) hours as needed. For anxiety   Yes Historical Provider, MD  Ginkgo Biloba 120 MG TABS Take 1 tablet by mouth daily.     Yes Historical Provider, MD  lisinopril-hydrochlorothiazide (PRINZIDE,ZESTORETIC) 10-12.5 MG per tablet Take 1 tablet by mouth daily.     Yes Historical Provider, MD  Misc Natural Products (OSTEO BI-FLEX ADV JOINT SHIELD PO) Take 1 tablet by mouth daily.     Yes  Historical Provider, MD  Multiple Vitamin (MULITIVITAMIN WITH MINERALS) TABS Take 1 tablet by mouth daily.   Yes Historical Provider, MD  Omega-3 Fatty Acids (FISH OIL) 1000 MG CAPS Take 1 capsule by mouth daily.     Yes Historical Provider, MD  sertraline (ZOLOFT) 50 MG tablet Take 50 mg by mouth daily.     Yes Historical Provider, MD  vitamin C (ASCORBIC ACID) 500 MG tablet Take 500 mg by mouth daily.     Yes Historical Provider, MD  vitamin E 200 UNIT capsule Take 200 Units by mouth 3 (three) times daily.     Yes Historical Provider, MD    Scheduled Meds:    . calcium carbonate  1 tablet Oral BID WC  . ciprofloxacin  400 mg Intravenous Once  . ciprofloxacin  400 mg Intravenous Q12H  . metronidazole  500 mg Intravenous Once  . metronidazole  500 mg Intravenous Q8H  .  morphine injection  4 mg Intravenous Once  .  morphine injection  4 mg Intravenous Once  .  morphine injection  4 mg Intravenous Once  . mulitivitamin with minerals  1 tablet Oral Daily  . ondansetron  4 mg Intravenous Once  . ondansetron (ZOFRAN) IV  4 mg Intravenous Once  . sertraline  50 mg Oral Daily  . sodium chloride  3 mL Intravenous Q12H  :    . sodium chloride 125 mL/hr at 06/25/11 0951  . sodium chloride 125 mL/hr at 06/25/11 1856   PRN Meds: acetaminophen, acetaminophen, diazepam, HYDROmorphone (DILAUDID) injection, iohexol, ondansetron (ZOFRAN) IV, ondansetron   Allergies as of 06/25/2011  . (No Known Allergies)    Family History  Problem Relation Age of Onset  . Coronary artery disease Father   . Leukemia Brother   . Colon cancer Brother   . Breast cancer Sister   . Ovarian cancer Sister   . Diabetes Daughter     History   Social History  . Marital Status: Widowed    Spouse Name: N/A    Number of Children: 2  . Years of Education: N/A   Occupational History  . RETIRED    Social History Main Topics  . Smoking status: Never Smoker   . Smokeless tobacco: Never Used  . Alcohol  Use: No  . Drug Use: No  . Sexually Active: Not on file   Other Topics Concern  . Not on file   Social History Narrative  . No narrative on file    REVIEW OF SYSTEMS: Constitutional:  Generally stable weight.  Reports increase of 3 # in last week.  ENT:  No nose bleeds or sinus troubles Pulm:  No cough.   No SOB.  CV:  No peripheral edema.  No chest pain or palpitaitons.  No tachycardia GU:  No hema/dysuria.  GI:  As above.  No dysphagia. Heme:  No hx low blood counts.    Transfusions:  none Neuro:  No headaches or dizzyness. No falls, gait problems.  Derm:  No rash, sores, itching. Endocrine:  No increased thirst.  Controls sugars with diet alone. Immunization:  Flu vaccine current. Travel:  None.  Musc skeletal:  Pt reports pain in front of both thighs when she stands or ambulates.  It is relieved with rest, never occurs at rest.    PHYSICAL EXAM: Vital signs in last 24 hours: Temp:  [97 F (36.1 C)-99.2 F (37.3 C)] 97 F (36.1 C) (04/08 0546) Pulse Rate:  [88-109] 109  (04/08 0546) Resp:  [18-19] 19  (04/08 0546) BP: (104-123)/(42-62) 105/62 mmHg (04/08 0546) SpO2:  [94 %-96 %] 94 % (04/08 0546) Weight:  [148 lb 13 oz (67.5 kg)] 148 lb 13 oz (67.5 kg) (04/07 1641)  General: pleasant, well appearing slightly anxious elderly WF.  NAD Head:  No facial assymetry  Eyes:  No conjunctival pallor Ears:  No hearing aid in place.  Not HLH  Nose:  No discharge or congestion Mouth:  Pink, clear, moist MM. Neck:  No mass or JVD Lungs:  No SOB, no cough.  Good breath sounds Heart: RRR.  No MRG.   Abdomen:  Soft, ND, no mass.  Tender in left lower quad to the mid lower belly. Rectal: tight, tender anal stricture.  Fresh red blood on glove after partial digital exam.  Unable to complete the digital exam due to tenderness.    Musc/Skeltl: no joint swelling Extremities:  No pedal edema.  Pedal pulse is diminished on both feet.  Vasc:  Varicosities in both thighs, more prominent  on right    Neurologic:  No tremor. Moves all 4's.  No cofusio, fully oriented Skin:  No rash or sores. Tattoos:  none Nodes:  No adenopathy at groin or neck.   Psych:  Pleasant, minimally anxious.  Intake/Output from previous day: 04/07 0701 - 04/08 0700 In: 3066.7 [I.V.:2766.7; IV Piggyback:300] Out: -  Intake/Output this shift:    LAB RESULTS:  Basename 06/26/11 0915 06/26/11 0037 06/25/11 1738 06/25/11 0928  WBC -- -- -- 13.1*  HGB 11.4* 12.1 12.5 --  HCT 32.1* 34.5* 35.1* --  PLT -- -- -- 171   BMET Lab Results  Component Value Date   NA 135 06/25/2011   NA 142 06/01/2011   NA 143 11/30/2010   K 3.5 06/25/2011   K 4.6 06/01/2011   K 4.1 11/30/2010   CL 98 06/25/2011   CL 107 06/01/2011   CL 106 11/30/2010   CO2 24 06/25/2011   CO2 27 06/01/2011   CO2 29 11/30/2010   GLUCOSE 147* 06/25/2011   GLUCOSE 100* 06/01/2011   GLUCOSE 80 11/30/2010   BUN 25* 06/25/2011   BUN 22 06/01/2011   BUN 15 11/30/2010   CREATININE 0.90 06/25/2011   CREATININE 1.17* 06/01/2011   CREATININE 0.98 11/30/2010   CALCIUM 9.7 06/25/2011   CALCIUM 9.3 06/01/2011   CALCIUM 9.6 11/30/2010   LFT  Basename 06/25/11 0928  PROT 6.1  ALBUMIN 3.7  AST 20  ALT 25  ALKPHOS 97  BILITOT 0.5  BILIDIR --  IBILI --   PT/INR Lab Results  Component Value Date   INR 1.02 06/25/2011   Hepatitis Panel No results found for this basename: HEPBSAG,HCVAB,HEPAIGM,HEPBIGM in the last 72 hours C-Diff No components found with this basename:  cdiff    Drugs of Abuse  No results found for this basename: labopia,  cocainscrnur,  labbenz,  amphetmu,  thcu,  labbarb     RADIOLOGY STUDIES: Ct Abdomen Pelvis W Contrast 06/25/2011  *RADIOLOGY REPORT*  Clinical Data: Left-sided abdominal pain, rectal bleeding  CT ABDOMEN AND PELVIS WITH CONTRAST  Technique:  Multidetector CT imaging of the abdomen and pelvis was performed following the standard protocol during bolus administration of intravenous contrast.  Contrast: 80mL OMNIPAQUE  IOHEXOL 300 MG/ML  SOLN  Comparison: 05/02/2005  Findings: Lung bases shows stable pleural based nodules right base anteriorly and laterally.  Stable atelectasis or scarring in the left base anteriorly.  Sagittal images of the spine shows diffuse osteopenia. Degenerative changes thoracolumbar spine. Disc space flattening at L4-L5 and L5 S1 level.  Enhanced liver shows no biliary ductal dilatation.  Low density lesion probable hemangioma left hepatic dome measures 1.2 cm stable in size and appearance from prior exam.  Spleen, pancreas and adrenals are unremarkable.  No calcified gallstones are noted within gallbladder.  Again noted status post right nephrectomy. Normal enhancement of the left kidney.  No left hydronephrosis or hydroureter.  Small amount of contrast material is noted in distal esophagus probable from gastroesophageal reflux disease.  Nonspecific mild thickening of distal esophageal wall.  Moderate stool noted in the right colon and proximal transverse colon.  There is abnormal thickening of the descending colon starting from the splenic flexure.  Colonic wall thickness measures 1.3 cm. There is mild stranding of the surrounding fat.  There is also thickening of the wall of the sigmoid colon.  Findings are consistent with a long segment colitis.  No pericolonic abscess or definite perforation is noted.  Scattered sigmoid colon diverticula are noted.  The urinary bladder is unremarkable.  The terminal ileum is unremarkable.  No small bowel obstruction.  No ascites or free air.  No destructive bony lesions are noted within pelvis.  Degenerative changes bilateral SI joints.  IMPRESSION:  1.  There is significant thickening of the descending colon wall. Mild surrounding inflammatory changes.  There is also thickening of sigmoid colon wall.  Findings are consistent with long segment colitis.  2.  No pericolonic abscess or perforation is noted.  Moderate stool is noted in proximal colon.  No small bowel  obstruction. 3.  Surgically absent right kidney.  Unremarkable left kidney. 4.  Stable low density lesion in the left hepatic dome probable benign in nature.  Original Report Authenticated By: Natasha Mead, M.D.   LEFT LOWER EXTREMITY VENOUS DUPLEX ULTRASOUND  06/14/2011 Technique: Gray-scale sonography with compression, as well as color  and duplex ultrasound, were performed to evaluate the deep venous  system from the level of the common femoral vein through the  popliteal and proximal calf veins. Findings: Normal compressibility and normal Doppler signal within  the common femoral, superficial femoral and popliteal veins, down  to the proximal calf veins. No grayscale filling defects to  suggest DVT.  Thrombosis is noted in the left greater saphenous vein from the  proximal thigh to the ankle.  IMPRESSION:  No evidence of left lower extremity deep vein thrombosis.  Left greater saphenous vein thrombophlebitis Charlett Nose MD   ENDOSCOPIC STUDIES: 08/2010   Colonoscopy   Shannon Marina Goodell   For hx sq cell anal ca and + PET scan with activity at rectum and bone.  Severe sigmoid diverticulosis, unable to traverse using peds scope.  Anal stricture.  BX:  MILD HYPERKERATOSIS. NO DYSPLASIA  OR MALIGNANCY. She did not have a repeat BE.   Single contrast BE 04/08/2008 1. Rectosigmoid colon diverticula with spasm.  2. No definite rectal lesion is seen.  3. Some retained feces with no persistent polypoid lesion or  constricting lesion is noted.    IMPRESSION: 1.  Left side colitis by CT scan. Pattern most c/w ischemic etiology.  Given more than 4 days of constipation, doubt very much this is C Diff so will D/C contact precautions.  Rectal bleeding could be from the colitis or from hemorrhoids or from Anal stricture or radiation proctitis.  The volume of blood reported does not seem large enough for diverticular bleed.  2.  Hx anal cancer. Non-surgical treatment with chemoradiation in 2004.  No evidence  of recurrence on the sigmoidoscopy in 08/2010.  Note pt is worried that the recent events represent recurrent cancer.  Recent encouraging/optimistic report from OV with oncologist on 06/01/11.   3.  Severe diverticulosis with associated stenosis, failed attempt to intubate beyond stenosis on attempted Colonoscopy 08/2010 4.  Hyperglycemia. Hx diet controlled diabetes per pt's report.  5.  Activity induced bilateral anterior thigh pain, relived with rest.  On 3/27 Dopplers had thrombophlebitis of left greater saphenous vein.   6.  Remote right nephrectomy for unclear but non-cancerous reason. Pt reports the kidney had "died and shriveled up"  PLAN: 1.  Supportive care.   2.  D/C contact precautions.  3.  Add Anusol HC suppositories 4.  Advance diet to fulls.  5.  I ordered ankle-brachial index study as I am concerned that the thigh pain represents arterial insufficiency.  If she does have arterial compromise, it would increase probability that the colitis is ischemic in nature.     LOS: 1 day   [name]  06/26/2011, 11:56 AM Pager: [phone]

## 2011-06-26 NOTE — Progress Notes (Signed)
Utilization review completed. Monica Becton 06/26/2011

## 2011-06-26 NOTE — Progress Notes (Signed)
DAILY PROGRESS NOTE                              [facility]  SUBJECTIVE: Feels better, denies any abdominal pain.  OBJECTIVE: BP 105/62  Pulse 109  Temp(Src) 97 F (36.1 C) (Oral)  Resp 19  Ht 5' 2.99" (1.6 m)  Wt 67.5 kg (148 lb 13 oz)  BMI 26.37 kg/m2  SpO2 94%  Intake/Output Summary (Last 24 hours) at 06/26/11 1343 Last data filed at 06/26/11 0700  Gross per 24 hour  Intake 3066.67 ml  Output      0 ml  Net 3066.67 ml                      Weight change:  Physical Exam: General: Alert and awake oriented x3 not in any acute distress. HEENT: anicteric sclera, pupils equal reactive to light and accommodation CVS: S1-S2 heard, no murmur rubs or gallops Chest: clear to auscultation bilaterally, no wheezing rales or rhonchi Abdomen:  normal bowel sounds, soft, nontender, nondistended, no organomegaly Neuro: Cranial nerves II-XII intact, no focal neurological deficits Extremities: no cyanosis, no clubbing or edema noted bilaterally   Lab Results:  [facility] 06/25/11 0928  NA 135  K 3.5  CL 98  CO2 24  GLUCOSE 147*  BUN 25*  CREATININE 0.90  CALCIUM 9.7  MG --  PHOS --    Basename 06/25/11 0928  AST 20  ALT 25  ALKPHOS 97  BILITOT 0.5  PROT 6.1  ALBUMIN 3.7    Basename 06/25/11 0928  LIPASE 13  AMYLASE --    Basename 06/26/11 0915 06/26/11 0037 06/25/11 0928  WBC -- -- 13.1*  NEUTROABS -- -- 11.5*  HGB 11.4* 12.1 --  HCT 32.1* 34.5* --  MCV -- -- 86.8  PLT -- -- 171   Micro Results: No results found for this or any previous visit (from the past 240 hour(s)).  Studies/Results: Nm Bone Scan Whole Body  05/29/2011  *RADIOLOGY REPORT*  Clinical Data: Colon cancer.  Evaluate bone metastasis.  NUCLEAR MEDICINE WHOLE BODY BONE SCINTIGRAPHY  Technique:  Whole body anterior and posterior images were obtained approximately 3 hours after intravenous injection of radiopharmaceutical.  Radiopharmaceutical: CURIE TC-MDP  TECHNETIUM TC 46M MEDRONATE IV KIT  Comparison: None.  Findings: Again noted are several areas of increased radiotracer uptake within the axial skeleton consistent with bone metastasis. When compared with the previous exam there is been interval improvement and asymmetric increased uptake within the right anterior iliac spine. Small focus of increased uptake within the approximate T6 vertebra is identified.  This is stable to improved in the interval.  There are no new areas of increased or decreased radiotracer uptake.  There is normal physiologic tracer activity within the left kidney and urinary bladder.  Degenerative type changes are noted within the left knee.  IMPRESSION:  1.  Stable to improved multifocal bone metastasis. 2.  No evidence for new or progressive disease.  Original Report Authenticated By: Rosealee Albee, M.D.   Ct Abdomen Pelvis W Contrast  06/25/2011  *RADIOLOGY REPORT*  Clinical Data: Left-sided abdominal pain, rectal bleeding  CT ABDOMEN AND PELVIS WITH CONTRAST  Technique:  Multidetector CT imaging of the abdomen and pelvis was performed following the standard protocol during bolus administration of intravenous contrast.  Contrast: 80mL OMNIPAQUE IOHEXOL 300 MG/ML  SOLN  Comparison: 05/02/2005  Findings: Lung bases shows  stable pleural based nodules right base anteriorly and laterally.  Stable atelectasis or scarring in the left base anteriorly.  Sagittal images of the spine shows diffuse osteopenia. Degenerative changes thoracolumbar spine. Disc space flattening at L4-L5 and L5 S1 level.  Enhanced liver shows no biliary ductal dilatation.  Low density lesion probable hemangioma left hepatic dome measures 1.2 cm stable in size and appearance from prior exam.  Spleen, pancreas and adrenals are unremarkable.  No calcified gallstones are noted within gallbladder.  Again noted status post right nephrectomy. Normal enhancement of the left kidney.  No left hydronephrosis or hydroureter.   Small amount of contrast material is noted in distal esophagus probable from gastroesophageal reflux disease.  Nonspecific mild thickening of distal esophageal wall.  Moderate stool noted in the right colon and proximal transverse colon.  There is abnormal thickening of the descending colon starting from the splenic flexure.  Colonic wall thickness measures 1.3 cm. There is mild stranding of the surrounding fat.  There is also thickening of the wall of the sigmoid colon.  Findings are consistent with a long segment colitis.  No pericolonic abscess or definite perforation is noted.  Scattered sigmoid colon diverticula are noted.  The urinary bladder is unremarkable.  The terminal ileum is unremarkable.  No small bowel obstruction.  No ascites or free air.  No destructive bony lesions are noted within pelvis.  Degenerative changes bilateral SI joints.  IMPRESSION:  1.  There is significant thickening of the descending colon wall. Mild surrounding inflammatory changes.  There is also thickening of sigmoid colon wall.  Findings are consistent with long segment colitis.  2.  No pericolonic abscess or perforation is noted.  Moderate stool is noted in proximal colon.  No small bowel obstruction. 3.  Surgically absent right kidney.  Unremarkable left kidney. 4.  Stable low density lesion in the left hepatic dome probable benign in nature.  Original Report Authenticated By: Natasha Mead, M.D.   US Venous Img Lower Unilateral Left  06/14/2011  *RADIOLOGY REPORT*  Clinical Data: Acute venous embolism and thrombosis of other specified veins  ? dvt;;  LEFT LOWER EXTREMITY VENOUS DUPLEX ULTRASOUND  Technique: Gray-scale sonography with compression, as well as color and duplex ultrasound, were performed to evaluate the deep venous system from the level of the common femoral vein through the popliteal and proximal calf veins.  Comparison: None  Findings:  Normal compressibility and normal Doppler signal within the common femoral,  superficial femoral and popliteal veins, down to the proximal calf veins.  No grayscale filling defects to suggest DVT.  Thrombosis is noted in the left greater saphenous vein from the proximal thigh to the ankle.  IMPRESSION: No evidence of left lower extremity deep vein thrombosis.  Left greater saphenous vein thrombophlebitis.  Original Report Authenticated By: Cyndie Chime, M.D.   Medications: Scheduled Meds:   . calcium carbonate  1 tablet Oral BID WC  . ciprofloxacin  400 mg Intravenous Once  . ciprofloxacin  400 mg Intravenous Q12H  . hydrocortisone  25 mg Rectal BID  . metronidazole  500 mg Intravenous Once  . metronidazole  500 mg Intravenous Q8H  .  morphine injection  4 mg Intravenous Once  . mulitivitamin with minerals  1 tablet Oral Daily  . sertraline  50 mg Oral Daily  . sodium chloride  3 mL Intravenous Q12H  . DISCONTD: calcium carbonate  600 mg Oral BID WC  . DISCONTD: ciprofloxacin  400 mg Intravenous Q12H  .  DISCONTD: iohexol  20 mL Oral Q1 Hr x 2  . DISCONTD: metronidazole  500 mg Intravenous Q8H   Continuous Infusions:   . sodium chloride 125 mL/hr at 06/25/11 0951  . sodium chloride 125 mL/hr at 06/25/11 1856  . DISCONTD: sodium chloride     PRN Meds:.acetaminophen, acetaminophen, diazepam, HYDROmorphone (DILAUDID) injection, ondansetron (ZOFRAN) IV, ondansetron, oxyCODONE-acetaminophen  ASSESSMENT & PLAN: Principal Problem:  *Rectal bleeding Active Problems:  Colitis  History of rectal or anal cancer  HTN (hypertension)   Left-sided colitis -Per gastroenterology notes for this is likely secondary to ischemic colitis. -Patient is on ciprofloxacin and Flagyl, GI please advise. -Supportive management, add Anusol and advance diet to full liquids.   Rectal bleeding -This is likely secondary to her constipation, ? hemorrhoids.  -Has severe diverticulosis, cannot rule out diverticular bleeding.   History of anal cancer -Status post chemoradiation no  evidence of recurrence.  Superficial thrombophlebitis -Noted on venous duplex done on 06/14/2011. -Left greater saphenous vein. As the bleeding probably has lower GI, can have NSAIDs as needed.   LOS: 1 day   Erandi Lemma A 06/26/2011, 1:43 PM

## 2011-06-27 DIAGNOSIS — C210 Malignant neoplasm of anus, unspecified: Secondary | ICD-10-CM | POA: Diagnosis not present

## 2011-06-27 DIAGNOSIS — R933 Abnormal findings on diagnostic imaging of other parts of digestive tract: Secondary | ICD-10-CM | POA: Diagnosis not present

## 2011-06-27 DIAGNOSIS — K5289 Other specified noninfective gastroenteritis and colitis: Secondary | ICD-10-CM | POA: Diagnosis not present

## 2011-06-27 DIAGNOSIS — I10 Essential (primary) hypertension: Secondary | ICD-10-CM | POA: Diagnosis not present

## 2011-06-27 DIAGNOSIS — K529 Noninfective gastroenteritis and colitis, unspecified: Secondary | ICD-10-CM | POA: Diagnosis not present

## 2011-06-27 DIAGNOSIS — K625 Hemorrhage of anus and rectum: Secondary | ICD-10-CM | POA: Diagnosis not present

## 2011-06-27 LAB — BASIC METABOLIC PANEL
BUN: 10 mg/dL (ref 6–23)
GFR calc non Af Amer: 65 mL/min — ABNORMAL LOW (ref >90)
Glucose, Bld: 90 mg/dL (ref 70–99)
Potassium: 4 mEq/L (ref 3.5–5.1)

## 2011-06-27 LAB — CBC
HCT: 28.8 % — ABNORMAL LOW (ref 36.0–46.0)
Hemoglobin: 9.9 g/dL — ABNORMAL LOW (ref 12.0–15.0)
MCH: 30.7 pg (ref 26.0–34.0)
MCHC: 34.4 g/dL (ref 30.0–36.0)
MCV: 89.2 fL (ref 78.0–100.0)

## 2011-06-27 NOTE — Progress Notes (Signed)
I have taken an interval history, reviewed the chart and examined the patient. Her segmental colitis is steadily improving-should be ready for discharge home soon. DC antibiotics since this is likely ischemic colitis. I agree with the extender's note, impression and recommendations.   Venita Lick. Russella Dar MD Clementeen Graham

## 2011-06-27 NOTE — Progress Notes (Signed)
   CARE MANAGEMENT NOTE 06/27/2011  Patient:  ARGENTINA, KOSCH   Account Number:  [id number]  Date Initiated:  06/27/2011  Documentation initiated by:  Letha Cape  Subjective/Objective Assessment:   dx rectal bleeding  admit- lives alone.     Action/Plan:   pt/ot eval.   Anticipated DC Date:  06/30/2011   Anticipated DC Plan:  HOME W HOME HEALTH SERVICES      DC Planning Services  CM consult      Choice offered to / List presented to:             Status of service:  In process, will continue to follow Medicare Important Message given?   (If response is "NO", the following Medicare IM given date fields will be blank) Date Medicare IM given:   Date Additional Medicare IM given:    Discharge Disposition:    Per UR Regulation:    If discussed at Long Length of Stay Meetings, dates discussed:    Comments:  06/27/11 14:26 Letha Cape RN, BSN [phone] patient lives alone, await pt eval.  Patient has medication coverage and transportation.  Daughter is Lynett Grimes phone [phone].  NCM will continue to follow for dc needs.

## 2011-06-27 NOTE — Progress Notes (Signed)
[facility] Daily Rounding Note 06/27/2011, 9:15 AM  SUBJECTIVE:       Small amount of blood, maybe 1 teaspoon total, visible on pad, occurred sometime overnight.  No actual stool.  Pain on right side, some radiation to right lower quad, is better.  No nausea with full liquids.  Wants to go home.  Not dizzy.  Feels well    OBJECTIVE:        General: looks well     Vital signs in last 24 hours:    Temp:  [98.3 F (36.8 C)-98.6 F (37 C)] 98.6 F (37 C) (04/09 0518) Pulse Rate:  [68-79] 68  (04/09 0518) Resp:  [18-20] 18  (04/09 0518) BP: (96-122)/(52-61) 114/61 mmHg (04/09 0518) SpO2:  [93 %-95 %] 94 % (04/09 0518) Last BM Date: 06/24/11  Heart: RRR Chest: clear B.  Breathing easily.  Abdomen: soft, hypoactive BS, minor tenderness in right lower abdomen.  No guard or revo  Extremities: no edema Neuro/Psych:  Pleasant, relaxed.  Not confused.  Intake/Output from previous day: 04/08 0701 - 04/09 0700 In: 2284.7 [P.O.:480; I.V.:1404.7; IV Piggyback:400] Out: -   Intake/Output this shift:    Lab Results:  Basename 06/27/11 0615 06/26/11 0915 06/26/11 0037 06/25/11 0928  WBC 7.4 -- -- 13.1*  HGB 9.9* 11.4* 12.1 --  HCT 28.8* 32.1* 34.5* --  PLT 124* -- -- 171   BMET  Basename 06/27/11 0615 06/25/11 0928  NA 138 135  K 4.0 3.5  CL 109 98  CO2 25 24  GLUCOSE 90 147*  BUN 10 25*  CREATININE 0.84 0.90  CALCIUM 8.8 9.7   LFT  Basename 06/25/11 0928  PROT 6.1  ALBUMIN 3.7  AST 20  ALT 25  ALKPHOS 97  BILITOT 0.5  BILIDIR --  IBILI --   PT/INR  Basename 06/25/11 0928  LABPROT 13.6  INR 1.02   Hepatitis Panel No results found for this basename: HEPBSAG,HCVAB,HEPAIGM,HEPBIGM in the last 72 hours  Studies/Results: Ankle brachial index summary:  06/26/2011. Right: ABI is within normal limits with abnormal waveforms in the anterior tibial artery. Left: ABI indicates a mild reduction in arterial flow.    ASSESMENT: 1. Left side colitis by CT scan.  Pattern most c/w ischemic etiology.   Rectal bleeding could be from the colitis or from hemorrhoids or from Anal stricture or radiation proctitis. The minor volume of blood reported does not seem large enough for diverticular bleed.  2. Hx anal cancer. Non-surgical treatment with chemoradiation in 2004. No evidence of recurrence on the sigmoidoscopy in 08/2010. Note pt is worried that the recent events represent recurrent cancer. Recent encouraging/optimistic report from OV with oncologist on 06/01/11.  3. Severe diverticulosis with associated stenosis, failed attempt to intubate beyond stenosis on attempted Colonoscopy 08/2010  4. Hyperglycemia. Hx diet controlled diabetes per pt's report.  5. Activity induced bilateral anterior thigh pain, relieved with rest. On 3/27 Dopplers had thrombophlebitis of left greater saphenous vein. ABI with mildly limited flow on left.  6. Remote right nephrectomy for unclear but non-cancerous reason. 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  Anemia, wonder if the 9.9 Hgb is equilibration, blood loss has been minor and does not explain this 1.5 gram drop in the last 21 hours.   PLAN: 1.  Advance to solid foods.  2.  IVF to [facility].  Stopping cipro and flagyl.  3.  CBC in AM.   4.  Would dc telemetry.  She  is stable   LOS: 2 days   [name]  06/27/2011, 9:15 AM Pager: [phone]

## 2011-06-27 NOTE — Progress Notes (Signed)
DAILY PROGRESS NOTE                              [facility]AL [facility]  SUBJECTIVE: Feels better, denies any abdominal pain.  OBJECTIVE: BP 114/61  Pulse 68  Temp(Src) 98.6 F (37 C) (Oral)  Resp 18  Ht 5' 2.99" (1.6 m)  Wt 67.5 kg (148 lb 13 oz)  BMI 26.37 kg/m2  SpO2 94%  Intake/Output Summary (Last 24 hours) at 06/27/11 1115 Last data filed at 06/27/11 0900  Gross per 24 hour  Intake 2524.67 ml  Output      0 ml  Net 2524.67 ml                      Weight change:  Physical Exam: General: Alert and awake oriented x3 not in any acute distress. HEENT: anicteric sclera, pupils equal reactive to light and accommodation CVS: S1-S2 heard, no murmur rubs or gallops Chest: clear to auscultation bilaterally, no wheezing rales or rhonchi Abdomen:  normal bowel sounds, soft, moderate tenderness in the left lower quadrant Neuro: Cranial nerves II-XII intact, no focal neurological deficits Extremities: no cyanosis, no clubbing or edema noted bilaterally   Lab Results:  Basename 06/27/11 0615 06/25/11 0928  NA 138 135  K 4.0 3.5  CL 109 98  CO2 25 24  GLUCOSE 90 147*  BUN 10 25*  CREATININE 0.84 0.90  CALCIUM 8.8 9.7  MG -- --  PHOS -- --    Basename 06/25/11 0928  AST 20  ALT 25  ALKPHOS 97  BILITOT 0.5  PROT 6.1  ALBUMIN 3.7    Basename 06/25/11 0928  LIPASE 13  AMYLASE --    Basename 06/27/11 0615 06/26/11 0915 06/25/11 0928  WBC 7.4 -- 13.1*  NEUTROABS -- -- 11.5*  HGB 9.9* 11.4* --  HCT 28.8* 32.1* --  MCV 89.2 -- 86.8  PLT 124* -- 171   Micro Results: Recent Results (from the past 240 hour(s))  URINE CULTURE     Status: Normal   Collection Time   06/25/11 10:28 AM      Component Value Range Status Comment   Specimen Description URINE, CATHETERIZED   Final    Special Requests NONE   Final    Culture  Setup Time [id number]   Final    Colony Count NO GROWTH   Final    Culture NO GROWTH   Final    Report Status 06/26/2011  FINAL   Final     Studies/Results: Nm Bone Scan Whole Body  05/29/2011  *RADIOLOGY REPORT*  Clinical Data: Colon cancer.  Evaluate bone metastasis.  NUCLEAR MEDICINE WHOLE BODY BONE SCINTIGRAPHY  Technique:  Whole body anterior and posterior images were obtained approximately 3 hours after intravenous injection of radiopharmaceutical.  Radiopharmaceutical: CURIE TC-MDP TECHNETIUM TC 64M MEDRONATE IV KIT  Comparison: None.  Findings: Again noted are several areas of increased radiotracer uptake within the axial skeleton consistent with bone metastasis. When compared with the previous exam there is been interval improvement and asymmetric increased uptake within the right anterior iliac spine. Small focus of increased uptake within the approximate T6 vertebra is identified.  This is stable to improved in the interval.  There are no new areas of increased or decreased radiotracer uptake.  There is normal physiologic tracer activity within the left kidney and urinary bladder.  Degenerative type changes are noted within the left  knee.  IMPRESSION:  1.  Stable to improved multifocal bone metastasis. 2.  No evidence for new or progressive disease.  Original Report Authenticated By: Rosealee Albee, M.D.   Ct Abdomen Pelvis W Contrast  06/25/2011  *RADIOLOGY REPORT*  Clinical Data: Left-sided abdominal pain, rectal bleeding  CT ABDOMEN AND PELVIS WITH CONTRAST  Technique:  Multidetector CT imaging of the abdomen and pelvis was performed following the standard protocol during bolus administration of intravenous contrast.  Contrast: 80mL OMNIPAQUE IOHEXOL 300 MG/ML  SOLN  Comparison: 05/02/2005  Findings: Lung bases shows stable pleural based nodules right base anteriorly and laterally.  Stable atelectasis or scarring in the left base anteriorly.  Sagittal images of the spine shows diffuse osteopenia. Degenerative changes thoracolumbar spine. Disc space flattening at L4-L5 and L5 S1 level.  Enhanced liver  shows no biliary ductal dilatation.  Low density lesion probable hemangioma left hepatic dome measures 1.2 cm stable in size and appearance from prior exam.  Spleen, pancreas and adrenals are unremarkable.  No calcified gallstones are noted within gallbladder.  Again noted status post right nephrectomy. Normal enhancement of the left kidney.  No left hydronephrosis or hydroureter.  Small amount of contrast material is noted in distal esophagus probable from gastroesophageal reflux disease.  Nonspecific mild thickening of distal esophageal wall.  Moderate stool noted in the right colon and proximal transverse colon.  There is abnormal thickening of the descending colon starting from the splenic flexure.  Colonic wall thickness measures 1.3 cm. There is mild stranding of the surrounding fat.  There is also thickening of the wall of the sigmoid colon.  Findings are consistent with a long segment colitis.  No pericolonic abscess or definite perforation is noted.  Scattered sigmoid colon diverticula are noted.  The urinary bladder is unremarkable.  The terminal ileum is unremarkable.  No small bowel obstruction.  No ascites or free air.  No destructive bony lesions are noted within pelvis.  Degenerative changes bilateral SI joints.  IMPRESSION:  1.  There is significant thickening of the descending colon wall. Mild surrounding inflammatory changes.  There is also thickening of sigmoid colon wall.  Findings are consistent with long segment colitis.  2.  No pericolonic abscess or perforation is noted.  Moderate stool is noted in proximal colon.  No small bowel obstruction. 3.  Surgically absent right kidney.  Unremarkable left kidney. 4.  Stable low density lesion in the left hepatic dome probable benign in nature.  Original Report Authenticated By: Natasha Mead, M.D.   US Venous Img Lower Unilateral Left  06/14/2011  *RADIOLOGY REPORT*  Clinical Data: Acute venous embolism and thrombosis of other specified veins  ? dvt;;   LEFT LOWER EXTREMITY VENOUS DUPLEX ULTRASOUND  Technique: Gray-scale sonography with compression, as well as color and duplex ultrasound, were performed to evaluate the deep venous system from the level of the common femoral vein through the popliteal and proximal calf veins.  Comparison: None  Findings:  Normal compressibility and normal Doppler signal within the common femoral, superficial femoral and popliteal veins, down to the proximal calf veins.  No grayscale filling defects to suggest DVT.  Thrombosis is noted in the left greater saphenous vein from the proximal thigh to the ankle.  IMPRESSION: No evidence of left lower extremity deep vein thrombosis.  Left greater saphenous vein thrombophlebitis.  Original Report Authenticated By: Cyndie Chime, M.D.   Medications: Scheduled Meds:    . calcium carbonate  1 tablet Oral BID WC  .  hydrocortisone  25 mg Rectal BID  .  morphine injection  4 mg Intravenous Once  . mulitivitamin with minerals  1 tablet Oral Daily  . sertraline  50 mg Oral Daily  . sodium chloride  3 mL Intravenous Q12H  . DISCONTD: ciprofloxacin  400 mg Intravenous Q12H  . DISCONTD: metronidazole  500 mg Intravenous Q8H   Continuous Infusions:    . sodium chloride 1,000 mL (06/27/11 0931)  . DISCONTD: sodium chloride 1,000 mL (06/27/11 0922)   PRN Meds:.acetaminophen, acetaminophen, diazepam, HYDROmorphone (DILAUDID) injection, ondansetron (ZOFRAN) IV, ondansetron, oxyCODONE-acetaminophen  ASSESSMENT & PLAN: Principal Problem:  *Rectal bleeding Active Problems:  Colitis  History of rectal or anal cancer  HTN (hypertension)  Nonspecific (abnormal) findings on radiological and other examination of gastrointestinal tract   Left-sided colitis -Per gastroenterology notes for this is likely secondary to ischemic colitis. -Patient is was on ciprofloxacin and Flagyl, discontinued. -Supportive management, add Anusol and advance diet to solids as tolerated..   Rectal  bleeding -This is likely secondary to her colitis, ? hemorrhoids.  -Has severe diverticulosis, cannot rule out diverticular bleeding.   History of anal cancer -Status post chemoradiation no evidence of recurrence.  Superficial thrombophlebitis -Noted on venous duplex done on 06/14/2011. -Left greater saphenous vein. As the bleeding probably has lower GI, can have NSAIDs as needed.   LOS: 2 days   Shannon Blair A 06/27/2011, 11:15 AM

## 2011-06-28 DIAGNOSIS — R933 Abnormal findings on diagnostic imaging of other parts of digestive tract: Secondary | ICD-10-CM | POA: Diagnosis not present

## 2011-06-28 DIAGNOSIS — K625 Hemorrhage of anus and rectum: Secondary | ICD-10-CM | POA: Diagnosis not present

## 2011-06-28 DIAGNOSIS — C210 Malignant neoplasm of anus, unspecified: Secondary | ICD-10-CM | POA: Diagnosis not present

## 2011-06-28 DIAGNOSIS — Z85048 Personal history of other malignant neoplasm of rectum, rectosigmoid junction, and anus: Secondary | ICD-10-CM | POA: Diagnosis not present

## 2011-06-28 DIAGNOSIS — I10 Essential (primary) hypertension: Secondary | ICD-10-CM | POA: Diagnosis not present

## 2011-06-28 DIAGNOSIS — K529 Noninfective gastroenteritis and colitis, unspecified: Secondary | ICD-10-CM | POA: Diagnosis not present

## 2011-06-28 DIAGNOSIS — K5289 Other specified noninfective gastroenteritis and colitis: Secondary | ICD-10-CM | POA: Diagnosis not present

## 2011-06-28 LAB — CBC
HCT: 30.5 % — ABNORMAL LOW (ref 36.0–46.0)
Hemoglobin: 10.6 g/dL — ABNORMAL LOW (ref 12.0–15.0)
MCH: 31 pg (ref 26.0–34.0)
MCHC: 34.8 g/dL (ref 30.0–36.0)
MCV: 89.2 fL (ref 78.0–100.0)
Platelets: 134 10*3/uL — ABNORMAL LOW (ref 150–400)
RBC: 3.42 MIL/uL — ABNORMAL LOW (ref 3.87–5.11)
RDW: 14.1 % (ref 11.5–15.5)
WBC: 7.1 10*3/uL (ref 4.0–10.5)

## 2011-06-28 LAB — BASIC METABOLIC PANEL
Calcium: 8.7 mg/dL (ref 8.4–10.5)
Creatinine, Ser: 0.82 mg/dL (ref 0.50–1.10)
GFR calc Af Amer: 78 mL/min — ABNORMAL LOW (ref >90)

## 2011-06-28 MED ORDER — ASPIRIN 325 MG PO TABS: 325.0000 mg | ORAL_TABLET | Freq: Every day | ORAL | Status: DC

## 2011-06-28 MED ORDER — OXYCODONE-ACETAMINOPHEN 5-325 MG PO TABS: 1.0000 | ORAL_TABLET | Freq: Four times a day (QID) | ORAL | Status: AC | PRN

## 2011-06-28 MED ORDER — HYDROCORTISONE ACETATE 25 MG RE SUPP: 25.0000 mg | Freq: Two times a day (BID) | RECTAL | Status: AC | PRN

## 2011-06-28 NOTE — Progress Notes (Signed)
Shannon Blair to be D/C'd Skilled nursing facility per MD order.  Discussed prescriptions and follow up appointments with the patient. Prescriptions given to patient, medication list explained in detail. Pt verbalized understanding.   [name], [name]  Home Medication Instructions [id number]   Printed on:06/28/11 1415  Medication Information                    calcium carbonate (OS-CAL) 600 MG TABS Take 600 mg by mouth 2 (two) times daily with a meal.             vitamin E 200 UNIT capsule Take 200 Units by mouth 3 (three) times daily.             sertraline (ZOLOFT) 50 MG tablet Take 50 mg by mouth daily.             diazepam (VALIUM) 5 MG tablet Take 5 mg by mouth every 6 (six) hours as needed. For anxiety           vitamin C (ASCORBIC ACID) 500 MG tablet Take 500 mg by mouth daily.             Omega-3 Fatty Acids (FISH OIL) 1000 MG CAPS Take 1 capsule by mouth daily.             Misc Natural Products (OSTEO BI-FLEX ADV JOINT SHIELD PO) Take 1 tablet by mouth daily.             Ginkgo Biloba 120 MG TABS Take 1 tablet by mouth daily.             Multiple Vitamin (MULITIVITAMIN WITH MINERALS) TABS Take 1 tablet by mouth daily.           hydrocortisone (ANUSOL-HC) 25 MG suppository Place 1 suppository (25 mg total) rectally 2 (two) times daily as needed for hemorrhoids.           oxyCODONE-acetaminophen (PERCOCET) 5-325 MG per tablet Take 1-2 tablets by mouth every 6 (six) hours as needed.           aspirin 325 MG tablet Take 1 tablet (325 mg total) by mouth daily.             Filed Vitals:   06/28/11 0855  BP: 132/64  Pulse: 80  Temp: 97.5 F (36.4 C)  Resp: 18    Skin clean, dry and intact without evidence of skin break down, no evidence of skin tears noted. IV catheter discontinued intact. Site without signs and symptoms of complications. Dressing and pressure applied. Pt denies pain at this time. No complaints noted.  An After Visit Summary was  printed and given to the patient. Patient escorted via WC, and D/C home via private auto.  [place], [employer] 06/28/2011 2:15 PM

## 2011-06-28 NOTE — Progress Notes (Signed)
I have taken an interval history, reviewed the chart and examined the patient. I agree with the extender's note, impression and recommendations.   Shannon Blair T. Shoshannah Faubert MD FACG 

## 2011-06-28 NOTE — Progress Notes (Signed)
[facility] Daily Rounding Note 06/28/2011, 8:54 AM  SUBJECTIVE:       No blood, stools are liquid.  Still has some left sided belly pain, especially noticed when she takes in a breath.  Tolerating solids, and meals do not worsen the abd pain.  Received Dilaudid 0.5 mg IV twice yesterday, one dose of 5/325 percocet for the pain.  No problems with urination.    OBJECTIVE:        General: looks well     Vital signs in last 24 hours:    Temp:  [98.5 F (36.9 C)] 98.5 F (36.9 C) (04/10 0622) Pulse Rate:  [70-76] 76  (04/10 0622) Resp:  [17-20] 17  (04/10 0622) BP: (120-147)/(54-64) 147/54 mmHg (04/10 0622) SpO2:  [95 %-96 %] 96 % (04/10 0622) Weight:  [149 lb 14.6 oz (68 kg)] 149 lb 14.6 oz (68 kg) (04/10 0622) Last BM Date: 06/27/11  Heart: RRR Chest: clear B.  No resp distress.  Abdomen: soft, slight tenderness in left mid/lower region.  BS hypoactive.  Soft but mildly distended.   Extremities: no CCE.  Neuro/Psych:  Pleasant.  Not confused or anxious.   Intake/Output from previous day: 04/09 0701 - 04/10 0700 In: 1180 [P.O.:960; I.V.:220] Out: 1 [Urine:1]  Intake/Output this shift:    Lab Results:  Basename 06/28/11 0512 06/27/11 0615 06/26/11 0915 06/25/11 0928  WBC 7.1 7.4 -- 13.1*  HGB 10.6* 9.9* 11.4* --  HCT 30.5* 28.8* 32.1* --  PLT 134* 124* -- 171   BMET  Basename 06/28/11 0512 06/27/11 0615 06/25/11 0928  NA 139 138 135  K 3.5 4.0 3.5  CL 106 109 98  CO2 24 25 24   GLUCOSE 91 90 147*  BUN 9 10 25*  CREATININE 0.82 0.84 0.90  CALCIUM 8.7 8.8 9.7   ASSESMENT: 1. Left side colitis by CT scan. Pattern most c/w ischemic etiology. Rectal bleeding could be from the colitis or from hemorrhoids or from Anal stricture or radiation proctitis. The minor volume of blood reported does not seem large enough for diverticular bleed.  2. Hx anal cancer. Non-surgical treatment with chemoradiation in 2004. No evidence of recurrence on the sigmoidoscopy in 08/2010. Note  pt is worried that the recent events represent recurrent cancer. Recent encouraging/optimistic report from OV with oncologist on 06/01/11.  3. Severe diverticulosis with associated stenosis, failed attempt to intubate beyond stenosis on attempted Colonoscopy 08/2010  4. Hyperglycemia. Hx diet controlled diabetes per pt's report.  5. Activity induced bilateral anterior thigh pain, relieved with rest. On 3/27 Dopplers had thrombophlebitis of left greater saphenous vein. ABI with mildly limited flow on left.  6. Remote right nephrectomy for unclear but non-cancerous reason. Pt reports the kidney had "died and shriveled up"  7. Anemia, Hgb up 1.5 grams without transfusion.  Suspect Hgb of 9.9 on 06/27/11 was aberrant.  8.  Thrombocytopenia. This predates back as far as May 2012.   PLAN: 1.  Needs to have pain managed adequately with po meds.  It would be ok for her to go home on several days Rx of PRN Percocet, but she needs to be ambulating and not experiencing any narcotic related neurologic impairment, so would switch to po pain control this AM, have her walk in hall and if she functions successfully, could go home later today.  2.  Stress importance of adequate hydration to prevent future low flow states, especially with Summer heat just around the corner.  3.  BID  Anusol PRN suppositories for bright red rectal bleeding.  Currently receiving this BID on schedule.  4.  Regarding restart of BP med Zestoretic, I wonder if this ought to be dc'd, as BP is in a good to low range without the med, and it raises the risk of low flow, especially the HCTZ diuretic component of this combo med.  5.  PRN follow up with Dr Marina Goodell at GI office.     LOS: 3 days   [name]  06/28/2011, 8:54 AM Pager: [phone]

## 2011-06-28 NOTE — Discharge Summary (Signed)
PATIENT DETAILS Name: Shannon Blair Age: 78 y.o. Sex: female Date of Birth: 10/04/1931 MRN: [id number]. Admit Date: 06/25/2011 Admitting Physician: Antonieta Pert, MD YQM:[id number],UXLKG, MD, MD  PRIMARY DISCHARGE DIAGNOSIS:  Principal Problem:  *Rectal bleeding Active Problems:  Colitis-Likely ischemic  History of rectal or anal cancer  HTN (hypertension)  Nonspecific (abnormal) findings on radiological and other examination of gastrointestinal tract Left greater saphenous vein thrombophlebitis.      PAST MEDICAL HISTORY: Past Medical History  Diagnosis Date  . Anal cancer 2004    Non-surgical mgt with chemo-radiation.  Dr Clelia Croft follows.   . Hypertension   . OA (osteoarthritis)   . Squamous cell carcinoma     skin  . Anal fissure   . Colon, diverticulosis     Sigmoid. With associated stenosis.  Colonoscopy failed to intubate beyond stenosis in 08/2010.  rectal bx then negative for recurrent cancer.     DISCHARGE MEDICATIONS: Medication List  As of 06/28/2011 12:06 PM   STOP taking these medications         lisinopril-hydrochlorothiazide 10-12.5 MG per tablet         TAKE these medications         aspirin 325 MG tablet   Take 1 tablet (325 mg total) by mouth daily.      calcium carbonate 600 MG Tabs   Commonly known as: OS-CAL   Take 600 mg by mouth 2 (two) times daily with a meal.      diazepam 5 MG tablet   Commonly known as: VALIUM   Take 5 mg by mouth every 6 (six) hours as needed. For anxiety      Fish Oil 1000 MG Caps   Take 1 capsule by mouth daily.      Ginkgo Biloba 120 MG Tabs   Take 1 tablet by mouth daily.      hydrocortisone 25 MG suppository   Commonly known as: ANUSOL-HC   Place 1 suppository (25 mg total) rectally 2 (two) times daily as needed for hemorrhoids.      mulitivitamin with minerals Tabs   Take 1 tablet by mouth daily.      OSTEO BI-FLEX ADV JOINT SHIELD PO   Take 1 tablet by mouth daily.      oxyCODONE-acetaminophen  5-325 MG per tablet   Commonly known as: PERCOCET   Take 1-2 tablets by mouth every 6 (six) hours as needed.      sertraline 50 MG tablet   Commonly known as: ZOLOFT   Take 50 mg by mouth daily.      vitamin C 500 MG tablet   Commonly known as: ASCORBIC ACID   Take 500 mg by mouth daily.      vitamin E 200 UNIT capsule   Take 200 Units by mouth 3 (three) times daily.             BRIEF HPI:  See H&P, Labs, Consult and Test reports for all details in brief,78 Years old Caucasian woman with history of rectal cancer followed by Dr Clelia Croft status post chemotherapy. She presented to the ER  with chief complaint of rectal bleeding and abdominal pain that started day before her admission   CONSULTATIONS:   GI  PERTINENT RADIOLOGIC STUDIES: Nm Bone Scan Whole Body  05/29/2011  *RADIOLOGY REPORT*  Clinical Data: Colon cancer.  Evaluate bone metastasis.  NUCLEAR MEDICINE WHOLE BODY BONE SCINTIGRAPHY  Technique:  Whole body anterior and posterior images were obtained approximately 3 hours after  intravenous injection of radiopharmaceutical.  Radiopharmaceutical: CURIE TC-MDP TECHNETIUM TC 27M MEDRONATE IV KIT  Comparison: None.  Findings: Again noted are several areas of increased radiotracer uptake within the axial skeleton consistent with bone metastasis. When compared with the previous exam there is been interval improvement and asymmetric increased uptake within the right anterior iliac spine. Small focus of increased uptake within the approximate T6 vertebra is identified.  This is stable to improved in the interval.  There are no new areas of increased or decreased radiotracer uptake.  There is normal physiologic tracer activity within the left kidney and urinary bladder.  Degenerative type changes are noted within the left knee.  IMPRESSION:  1.  Stable to improved multifocal bone metastasis. 2.  No evidence for new or progressive disease.  Original Report Authenticated By: Rosealee Albee, M.D.   Ct Abdomen Pelvis W Contrast  06/25/2011  *RADIOLOGY REPORT*  Clinical Data: Left-sided abdominal pain, rectal bleeding  CT ABDOMEN AND PELVIS WITH CONTRAST  Technique:  Multidetector CT imaging of the abdomen and pelvis was performed following the standard protocol during bolus administration of intravenous contrast.  Contrast: 80mL OMNIPAQUE IOHEXOL 300 MG/ML  SOLN  Comparison: 05/02/2005  Findings: Lung bases shows stable pleural based nodules right base anteriorly and laterally.  Stable atelectasis or scarring in the left base anteriorly.  Sagittal images of the spine shows diffuse osteopenia. Degenerative changes thoracolumbar spine. Disc space flattening at L4-L5 and L5 S1 level.  Enhanced liver shows no biliary ductal dilatation.  Low density lesion probable hemangioma left hepatic dome measures 1.2 cm stable in size and appearance from prior exam.  Spleen, pancreas and adrenals are unremarkable.  No calcified gallstones are noted within gallbladder.  Again noted status post right nephrectomy. Normal enhancement of the left kidney.  No left hydronephrosis or hydroureter.  Small amount of contrast material is noted in distal esophagus probable from gastroesophageal reflux disease.  Nonspecific mild thickening of distal esophageal wall.  Moderate stool noted in the right colon and proximal transverse colon.  There is abnormal thickening of the descending colon starting from the splenic flexure.  Colonic wall thickness measures 1.3 cm. There is mild stranding of the surrounding fat.  There is also thickening of the wall of the sigmoid colon.  Findings are consistent with a long segment colitis.  No pericolonic abscess or definite perforation is noted.  Scattered sigmoid colon diverticula are noted.  The urinary bladder is unremarkable.  The terminal ileum is unremarkable.  No small bowel obstruction.  No ascites or free air.  No destructive bony lesions are noted within pelvis.  Degenerative  changes bilateral SI joints.  IMPRESSION:  1.  There is significant thickening of the descending colon wall. Mild surrounding inflammatory changes.  There is also thickening of sigmoid colon wall.  Findings are consistent with long segment colitis.  2.  No pericolonic abscess or perforation is noted.  Moderate stool is noted in proximal colon.  No small bowel obstruction. 3.  Surgically absent right kidney.  Unremarkable left kidney. 4.  Stable low density lesion in the left hepatic dome probable benign in nature.  Original Report Authenticated By: Natasha Mead, M.D.   US Venous Img Lower Unilateral Left  06/14/2011  *RADIOLOGY REPORT*  Clinical Data: Acute venous embolism and thrombosis of other specified veins  ? dvt;;  LEFT LOWER EXTREMITY VENOUS DUPLEX ULTRASOUND  Technique: Gray-scale sonography with compression, as well as color and duplex ultrasound, were performed to evaluate the  deep venous system from the level of the common femoral vein through the popliteal and proximal calf veins.  Comparison: None  Findings:  Normal compressibility and normal Doppler signal within the common femoral, superficial femoral and popliteal veins, down to the proximal calf veins.  No grayscale filling defects to suggest DVT.  Thrombosis is noted in the left greater saphenous vein from the proximal thigh to the ankle.  IMPRESSION: No evidence of left lower extremity deep vein thrombosis.  Left greater saphenous vein thrombophlebitis.  Original Report Authenticated By: Cyndie Chime, M.D.     PERTINENT LAB RESULTS: CBC:  Basename 06/28/11 0512 06/27/11 0615  WBC 7.1 7.4  HGB 10.6* 9.9*  HCT 30.5* 28.8*  PLT 134* 124*   CMET CMP     Component Value Date/Time   NA 139 06/28/2011 0512   K 3.5 06/28/2011 0512   CL 106 06/28/2011 0512   CO2 24 06/28/2011 0512   GLUCOSE 91 06/28/2011 0512   BUN 9 06/28/2011 0512   CREATININE 0.82 06/28/2011 0512   CALCIUM 8.7 06/28/2011 0512   PROT 6.1 06/25/2011 0928   ALBUMIN 3.7  06/25/2011 0928   AST 20 06/25/2011 0928   ALT 25 06/25/2011 0928   ALKPHOS 97 06/25/2011 0928   BILITOT 0.5 06/25/2011 0928   GFRNONAA 67* 06/28/2011 0512   GFRAA 78* 06/28/2011 0512    GFR Estimated Creatinine Clearance: 53.2 ml/min (by C-G formula based on Cr of 0.82). No results found for this basename: LIPASE:2,AMYLASE:2 in the last 72 hours No results found for this basename: CKTOTAL:3,CKMB:3,CKMBINDEX:3,TROPONINI:3 in the last 72 hours No components found with this basename: POCBNP:3 No results found for this basename: DDIMER:2 in the last 72 hours No results found for this basename: HGBA1C:2 in the last 72 hours No results found for this basename: CHOL:2,HDL:2,LDLCALC:2,TRIG:2,CHOLHDL:2,LDLDIRECT:2 in the last 72 hours No results found for this basename: TSH,T4TOTAL,FREET3,T3FREE,THYROIDAB in the last 72 hours No results found for this basename: VITAMINB12:2,FOLATE:2,FERRITIN:2,TIBC:2,IRON:2,RETICCTPCT:2 in the last 72 hours Coags: No results found for this basename: PT:2,INR:2 in the last 72 hours Microbiology: Recent Results (from the past 240 hour(s))  URINE CULTURE     Status: Normal   Collection Time   06/25/11 10:28 AM      Component Value Range Status Comment   Specimen Description URINE, CATHETERIZED   Final    Special Requests NONE   Final    Culture  Setup Time [id number]   Final    Colony Count NO GROWTH   Final    Culture NO GROWTH   Final    Report Status 06/26/2011 FINAL   Final      BRIEF HOSPITAL COURSE:   Principal Problem: Left-sided colitis  -Per gastroenterology notes for this is likely secondary to ischemic colitis.  -Patient is was on ciprofloxacin and Flagyl, which was subsequently discontinued.  -Patient was started on Supportive management, add Anusol and diet was advanced to as tregular tolerated. -She still continues to have mild left-sided pain, however that is very soft on exam. She is tolerating a regular diet. We will discharge on as needed  Percocet.  Rectal bleeding  -This  has resolved. -This is likely secondary to her colitis, -Has severe diverticulosis, cannot rule out diverticular bleeding-but unlikely -Also has hemorrhoids, would change an assault on discharge to twice daily as needed -Hemoglobin is actually up to 10.6 from 9.9 yesterday.  -GI has seen her today and has cleared the patient for discharge   History of anal cancer  -  Status post chemoradiation no evidence of recurrence.  -Outpatient followup at her next appointment with her primary oncologist  Superficial thrombophlebitis  -Noted on venous duplex done on 06/14/2011.  -Left greater saphenous vein. As the bleeding probably has lower GI, can have NSAIDs as needed, apply a warm and cold compresses as needed.  Hypertension  -Currently BP well-controlled without the use of any of her antihypertensive medications  -Given that she has had ischemic colitis likely secondary to a low flow state, we'll continue to hold off on any BP medications for now. She claims that she has a followup appointment with her primary care practitioner in the next few days, I would defer to the patient's primary care practitioner whether and when to resume her antihypertensive medications.    TODAY-DAY OF DISCHARGE:  Subjective:   Shannon Blair today has no headache,no chest ,no new weakness tingling or numbness, feels much better wants to go home today. Patient has ambulated in the hallway without any difficulty, she is now tolerating a regular diet has some mild intermittent left abdominal pain.  Objective:   Blood pressure 132/64, pulse 80, temperature 97.5 F (36.4 C), temperature source Oral, resp. rate 18, height 5' 2.99" (1.6 m), weight 68 kg (149 lb 14.6 oz), SpO2 98.00%.  Intake/Output Summary (Last 24 hours) at 06/28/11 1206 Last data filed at 06/28/11 0550  Gross per 24 hour  Intake    700 ml  Output      1 ml  Net    699 ml    Exam Awake Alert, Oriented *3,  No new F.N deficits, Normal affect .AT,PERRAL Supple Neck,No JVD, No cervical lymphadenopathy appriciated.  Symmetrical Chest wall movement, Good air movement bilaterally, CTAB RRR,No Gallops,Rubs or new Murmurs, No Parasternal Heave +ve B.Sounds, Abd Soft, Non tender, No organomegaly appriciated, No rebound -guarding or rigidity. No Cyanosis, Clubbing or edema, No new Rash or bruise  DISPOSITION: Home  DISCHARGE INSTRUCTIONS:    Follow-up Information    Follow up with [facility] Peak, MD in 1 week. (please keep next appointment if earlier than 1 week)       Follow up with Yancey Flemings, MD. (As needed)    Contact information:   [address] [facility] [place] [address] [place] [place] [address] [phone]           Total Time spent on discharge equals 45 minutes.  SignedJeoffrey Massed 06/28/2011 12:06 PM

## 2011-06-28 NOTE — Progress Notes (Signed)
   CARE MANAGEMENT NOTE 06/28/2011  Patient:  Shannon Blair, Shannon Blair   Account Number:  [id number]  Date Initiated:  06/27/2011  Documentation initiated by:  Letha Cape  Subjective/Objective Assessment:   dx rectal bleeding  admit- lives alone.     Action/Plan:   Anticipated DC Date:  06/28/2011   Anticipated DC Plan:  HOME/SELF CARE      DC Planning Services  CM consult      Choice offered to / List presented to:             Status of service:  Completed, signed off Medicare Important Message given?   (If response is "NO", the following Medicare IM given date fields will be blank) Date Medicare IM given:   Date Additional Medicare IM given:    Discharge Disposition:  HOME/SELF CARE  Per UR Regulation:    If discussed at Long Length of Stay Meetings, dates discussed:    Comments:  06/28/11 12:01 Letha Cape RN, BSN [phone] patient for dc today, patient ambulated with RN, she did very well no pt needed.  patient's daughter will be here to pick her up today.  No needs identified.  06/27/11 14:26 Letha Cape RN, BSN [phone] patient lives alone, await pt eval.  Patient has medication coverage and transportation.  Daughter is Shannon Blair phone [phone].  NCM will continue to follow for dc needs.

## 2011-08-16 DIAGNOSIS — L570 Actinic keratosis: Secondary | ICD-10-CM | POA: Diagnosis not present

## 2011-08-16 DIAGNOSIS — D235 Other benign neoplasm of skin of trunk: Secondary | ICD-10-CM | POA: Diagnosis not present

## 2011-08-23 DIAGNOSIS — R7309 Other abnormal glucose: Secondary | ICD-10-CM | POA: Diagnosis not present

## 2011-08-23 DIAGNOSIS — I10 Essential (primary) hypertension: Secondary | ICD-10-CM | POA: Diagnosis not present

## 2011-08-23 DIAGNOSIS — M159 Polyosteoarthritis, unspecified: Secondary | ICD-10-CM | POA: Diagnosis not present

## 2011-08-30 DIAGNOSIS — M159 Polyosteoarthritis, unspecified: Secondary | ICD-10-CM | POA: Diagnosis not present

## 2011-08-30 DIAGNOSIS — M5137 Other intervertebral disc degeneration, lumbosacral region: Secondary | ICD-10-CM | POA: Diagnosis not present

## 2011-08-30 DIAGNOSIS — R7309 Other abnormal glucose: Secondary | ICD-10-CM | POA: Diagnosis not present

## 2011-08-30 DIAGNOSIS — I10 Essential (primary) hypertension: Secondary | ICD-10-CM | POA: Diagnosis not present

## 2011-09-14 DIAGNOSIS — Z1231 Encounter for screening mammogram for malignant neoplasm of breast: Secondary | ICD-10-CM | POA: Diagnosis not present

## 2011-09-19 DIAGNOSIS — N6489 Other specified disorders of breast: Secondary | ICD-10-CM | POA: Diagnosis not present

## 2011-10-27 DIAGNOSIS — M48061 Spinal stenosis, lumbar region without neurogenic claudication: Secondary | ICD-10-CM | POA: Diagnosis not present

## 2011-10-27 DIAGNOSIS — M545 Low back pain, unspecified: Secondary | ICD-10-CM | POA: Diagnosis not present

## 2011-10-27 DIAGNOSIS — M5137 Other intervertebral disc degeneration, lumbosacral region: Secondary | ICD-10-CM | POA: Diagnosis not present

## 2011-10-27 DIAGNOSIS — M47817 Spondylosis without myelopathy or radiculopathy, lumbosacral region: Secondary | ICD-10-CM | POA: Diagnosis not present

## 2011-10-27 DIAGNOSIS — M546 Pain in thoracic spine: Secondary | ICD-10-CM | POA: Diagnosis not present

## 2011-10-27 DIAGNOSIS — IMO0002 Reserved for concepts with insufficient information to code with codable children: Secondary | ICD-10-CM | POA: Diagnosis not present

## 2011-11-01 ENCOUNTER — Other Ambulatory Visit: Payer: Self-pay | Admitting: Neurosurgery

## 2011-11-01 DIAGNOSIS — IMO0002 Reserved for concepts with insufficient information to code with codable children: Secondary | ICD-10-CM

## 2011-11-01 DIAGNOSIS — M47817 Spondylosis without myelopathy or radiculopathy, lumbosacral region: Secondary | ICD-10-CM

## 2011-11-01 DIAGNOSIS — M5137 Other intervertebral disc degeneration, lumbosacral region: Secondary | ICD-10-CM

## 2011-11-01 DIAGNOSIS — M48061 Spinal stenosis, lumbar region without neurogenic claudication: Secondary | ICD-10-CM

## 2011-11-07 ENCOUNTER — Inpatient Hospital Stay: Admission: RE | Admit: 2011-11-07 | Payer: Medicare Other | Source: Ambulatory Visit

## 2011-11-13 DIAGNOSIS — Z961 Presence of intraocular lens: Secondary | ICD-10-CM | POA: Insufficient documentation

## 2011-11-16 NOTE — Progress Notes (Addendum)
Blood drawn for labs, pt to have an MRI on 11/21/11. Site unremarkable, tolerated well. Done by J. Lohr Charity fundraiser.

## 2011-11-22 ENCOUNTER — Ambulatory Visit
Admission: RE | Admit: 2011-11-22 | Discharge: 2011-11-22 | Disposition: A | Discharge status: Home / Self Care | Payer: Medicare Other | Source: Ambulatory Visit | Attending: Neurosurgery | Admitting: Neurosurgery

## 2011-11-22 DIAGNOSIS — M48061 Spinal stenosis, lumbar region without neurogenic claudication: Secondary | ICD-10-CM | POA: Diagnosis not present

## 2011-11-22 DIAGNOSIS — M5137 Other intervertebral disc degeneration, lumbosacral region: Secondary | ICD-10-CM | POA: Diagnosis not present

## 2011-11-22 DIAGNOSIS — M47817 Spondylosis without myelopathy or radiculopathy, lumbosacral region: Secondary | ICD-10-CM | POA: Diagnosis not present

## 2011-11-22 DIAGNOSIS — IMO0002 Reserved for concepts with insufficient information to code with codable children: Secondary | ICD-10-CM | POA: Diagnosis not present

## 2011-11-22 MED ORDER — GADOBENATE DIMEGLUMINE 529 MG/ML IV SOLN
6.0000 mL | Freq: Once | INTRAVENOUS | Status: AC | PRN
  Administered 2011-11-22: 6 mL via INTRAVENOUS

## 2011-11-25 DIAGNOSIS — Z23 Encounter for immunization: Secondary | ICD-10-CM | POA: Diagnosis not present

## 2011-11-28 DIAGNOSIS — IMO0002 Reserved for concepts with insufficient information to code with codable children: Secondary | ICD-10-CM | POA: Diagnosis not present

## 2011-11-28 DIAGNOSIS — M5137 Other intervertebral disc degeneration, lumbosacral region: Secondary | ICD-10-CM | POA: Diagnosis not present

## 2011-11-28 DIAGNOSIS — M48061 Spinal stenosis, lumbar region without neurogenic claudication: Secondary | ICD-10-CM | POA: Diagnosis not present

## 2011-11-28 DIAGNOSIS — M47817 Spondylosis without myelopathy or radiculopathy, lumbosacral region: Secondary | ICD-10-CM | POA: Diagnosis not present

## 2011-12-07 DIAGNOSIS — M48061 Spinal stenosis, lumbar region without neurogenic claudication: Secondary | ICD-10-CM | POA: Diagnosis not present

## 2011-12-07 DIAGNOSIS — M5126 Other intervertebral disc displacement, lumbar region: Secondary | ICD-10-CM | POA: Diagnosis not present

## 2011-12-07 DIAGNOSIS — M542 Cervicalgia: Secondary | ICD-10-CM | POA: Diagnosis not present

## 2011-12-08 ENCOUNTER — Ambulatory Visit (HOSPITAL_BASED_OUTPATIENT_CLINIC_OR_DEPARTMENT_OTHER): Payer: Medicare Other | Admitting: Oncology

## 2011-12-08 ENCOUNTER — Other Ambulatory Visit (HOSPITAL_BASED_OUTPATIENT_CLINIC_OR_DEPARTMENT_OTHER): Payer: Medicare Other | Admitting: Lab

## 2011-12-08 VITALS — BP 124/56 | HR 59 | Temp 97.4°F | Resp 20 | Ht 63.0 in | Wt 147.0 lb

## 2011-12-08 DIAGNOSIS — Z85048 Personal history of other malignant neoplasm of rectum, rectosigmoid junction, and anus: Secondary | ICD-10-CM

## 2011-12-08 DIAGNOSIS — M899 Disorder of bone, unspecified: Secondary | ICD-10-CM

## 2011-12-08 DIAGNOSIS — C20 Malignant neoplasm of rectum: Secondary | ICD-10-CM

## 2011-12-08 LAB — COMPREHENSIVE METABOLIC PANEL (CC13)
ALT: 34 U/L (ref 0–55)
AST: 20 U/L (ref 5–34)
CO2: 24 mEq/L (ref 22–29)
Chloride: 108 mEq/L — ABNORMAL HIGH (ref 98–107)
Sodium: 140 mEq/L (ref 136–145)
Total Bilirubin: 0.3 mg/dL (ref 0.20–1.20)
Total Protein: 6.1 g/dL — ABNORMAL LOW (ref 6.4–8.3)

## 2011-12-08 LAB — CBC WITH DIFFERENTIAL/PLATELET
BASO%: 0.1 % (ref 0.0–2.0)
EOS%: 0.1 % (ref 0.0–7.0)
LYMPH%: 10.5 % — ABNORMAL LOW (ref 14.0–49.7)
MCHC: 35.6 g/dL (ref 31.5–36.0)
MONO#: 0.4 10*3/uL (ref 0.1–0.9)
RBC: 3.93 10*6/uL (ref 3.70–5.45)
WBC: 7.9 10*3/uL (ref 3.9–10.3)
lymph#: 0.8 10*3/uL — ABNORMAL LOW (ref 0.9–3.3)

## 2011-12-08 NOTE — Progress Notes (Signed)
Hematology and Oncology Follow Up Visit  Shannon Blair [id number] 1931-06-20 78 y.o. 12/08/2011 10:36 AM  CC: Shannon Blair, M.D.    Principle Diagnosis: This is a 78 year old female with a history of anal squamous cell carcinoma, now has questionable bony disease versus a benign bony abnormality.  Interim History:  Shannon Blair is a 78 year old female with the above history.  She is presenting today for a followup visit.  As mentioned, she had a bone scan that showed a few areas of bony uptake again suspicious for bony metastasis.  However, her workup had been relatively unrevealing her repeat bone scan in 05/2011 showed improvement of these bone lesions.  Since the last time I saw her, she was hospitalized for colitis which has resolved now. she had not reported any worsening bone pain.  She did not report any hip pain.  She did not report any pathological fractures.  She did not report any constitutional symptoms.  Her energy, performance status and activity level all remains normal. No bone pain noted. No hospitalizations or illnesses.    Medications: I have reviewed the patient's current medications. Current outpatient prescriptions:aspirin 325 MG tablet, Take 1 tablet (325 mg total) by mouth daily., Disp: , Rfl: ;  calcium carbonate (OS-CAL) 600 MG TABS, Take 600 mg by mouth 2 (two) times daily with a meal.  , Disp: , Rfl: ;  diazepam (VALIUM) 5 MG tablet, Take 5 mg by mouth every 6 (six) hours as needed. For anxiety, Disp: , Rfl: ;  Misc Natural Products (OSTEO BI-FLEX ADV JOINT SHIELD PO), Take 1 tablet by mouth daily.  , Disp: , Rfl:  Multiple Vitamin (MULITIVITAMIN WITH MINERALS) TABS, Take 1 tablet by mouth daily., Disp: , Rfl: ;  Omega-3 Fatty Acids (FISH OIL) 1000 MG CAPS, Take 1 capsule by mouth daily.  , Disp: , Rfl: ;  sertraline (ZOLOFT) 50 MG tablet, Take 50 mg by mouth daily.  , Disp: , Rfl: ;  vitamin C (ASCORBIC ACID) 500 MG tablet, Take 500 mg by mouth daily.  , Disp: , Rfl:   vitamin E 200 UNIT capsule, Take 200 Units by mouth 3 (three) times daily.  , Disp: , Rfl:   Allergies: No Known Allergies  Past Medical History, Surgical history, Social history, and Family History were reviewed and updated.  Review of Systems: Constitutional:  Negative for fever, chills, night sweats, anorexia, weight loss, pain. Cardiovascular: no chest pain or dyspnea on exertion Respiratory: no cough, shortness of breath, or wheezing Neurological: no TIA or stroke symptoms Dermatological: negative ENT: negative Skin: Negative. Gastrointestinal: no abdominal pain, change in bowel habits, or black or bloody stools Genito-Urinary: negative Hematological and Lymphatic: negative Breast: negative Musculoskeletal: negative Remaining ROS negative. Physical Exam: Blood pressure 124/56, pulse 59, temperature 97.4 F (36.3 C), temperature source Oral, resp. rate 20, height 5\' 3"  (1.6 m), weight 147 lb (66.679 kg). ECOG: 1 General appearance: alert Head: Normocephalic, without obvious abnormality, atraumatic Neck: no adenopathy, no carotid bruit, no JVD, supple, symmetrical, trachea midline and thyroid not enlarged, symmetric, no tenderness/mass/nodules Lymph nodes: Cervical, supraclavicular, and axillary nodes normal. Heart:regular rate and rhythm, S1, S2 normal, no murmur, click, rub or gallop Lung:chest clear, no wheezing, rales, normal symmetric air entry Abdomin: soft, non-tender, without masses or organomegaly EXT:no erythema, induration, or nodules   Lab Results: Lab Results  Component Value Date   WBC 7.9 12/08/2011   HGB 12.6 12/08/2011   HCT 35.5 12/08/2011   MCV 90.3 12/08/2011  PLT 150 12/08/2011     Chemistry      Component Value Date/Time   NA 139 06/28/2011 0512   K 3.5 06/28/2011 0512   CL 106 06/28/2011 0512   CO2 24 06/28/2011 0512   BUN 9 06/28/2011 0512   CREATININE 0.82 06/28/2011 0512      Component Value Date/Time   CALCIUM 8.7 06/28/2011 0512   ALKPHOS  97 06/25/2011 0928   AST 20 06/25/2011 0928   ALT 25 06/25/2011 0928   BILITOT 0.5 06/25/2011 0928     MRI on 11/22/2011 IMPRESSION: Increased broad-based soft disc protrusions at L4-5  and L5 S1 with slightly increased moderately severe spinal stenosis  at L4-5 and moderate spinal stenosis at L5-S1. Multiple bone  lesions seen on the prior study have resolved. No pathologic  enhancement after contrast administration.   Impression and Plan:  A 78 year old female with the following issues. 1. History of squamous cell carcinoma anal cancer status post definitive therapy completed in 2004.  No evidence to suggest recurrent disease. 2. Questionable bony abnormalities.  At this time I continue to really see no evidence of any malignancy.  These radioactive uptake in the bone scan, some of them have diminished, some of them have remained stable.  MRI done on 9/4 showed that some of these bone lesions have resolved which is not consistent with cancer of any kind.  At this point this could be a benign bone problem such as Paget's disease or any bone turnover disease, and less likely any cancer. 3. Follow up will be as needed.   [facility], MD 9/20/201310:36 AM

## 2012-01-18 DIAGNOSIS — M5137 Other intervertebral disc degeneration, lumbosacral region: Secondary | ICD-10-CM | POA: Diagnosis not present

## 2012-01-18 DIAGNOSIS — M47817 Spondylosis without myelopathy or radiculopathy, lumbosacral region: Secondary | ICD-10-CM | POA: Diagnosis not present

## 2012-01-18 DIAGNOSIS — M48061 Spinal stenosis, lumbar region without neurogenic claudication: Secondary | ICD-10-CM | POA: Diagnosis not present

## 2012-01-30 DIAGNOSIS — M159 Polyosteoarthritis, unspecified: Secondary | ICD-10-CM | POA: Diagnosis not present

## 2012-01-30 DIAGNOSIS — I10 Essential (primary) hypertension: Secondary | ICD-10-CM | POA: Diagnosis not present

## 2012-01-30 DIAGNOSIS — R7309 Other abnormal glucose: Secondary | ICD-10-CM | POA: Diagnosis not present

## 2012-01-30 DIAGNOSIS — M5137 Other intervertebral disc degeneration, lumbosacral region: Secondary | ICD-10-CM | POA: Diagnosis not present

## 2012-02-06 DIAGNOSIS — M5137 Other intervertebral disc degeneration, lumbosacral region: Secondary | ICD-10-CM | POA: Diagnosis not present

## 2012-02-06 DIAGNOSIS — R7309 Other abnormal glucose: Secondary | ICD-10-CM | POA: Diagnosis not present

## 2012-02-06 DIAGNOSIS — I10 Essential (primary) hypertension: Secondary | ICD-10-CM | POA: Diagnosis not present

## 2012-02-06 DIAGNOSIS — M159 Polyosteoarthritis, unspecified: Secondary | ICD-10-CM | POA: Diagnosis not present

## 2012-03-29 DIAGNOSIS — M48061 Spinal stenosis, lumbar region without neurogenic claudication: Secondary | ICD-10-CM | POA: Diagnosis not present

## 2012-05-09 DIAGNOSIS — M47817 Spondylosis without myelopathy or radiculopathy, lumbosacral region: Secondary | ICD-10-CM | POA: Diagnosis not present

## 2012-05-09 DIAGNOSIS — M48062 Spinal stenosis, lumbar region with neurogenic claudication: Secondary | ICD-10-CM | POA: Diagnosis not present

## 2012-05-23 ENCOUNTER — Encounter: Payer: Medicare Other | Admitting: Women's Health

## 2012-06-12 DIAGNOSIS — I10 Essential (primary) hypertension: Secondary | ICD-10-CM | POA: Diagnosis not present

## 2012-06-12 DIAGNOSIS — N390 Urinary tract infection, site not specified: Secondary | ICD-10-CM | POA: Diagnosis not present

## 2012-06-12 DIAGNOSIS — M5137 Other intervertebral disc degeneration, lumbosacral region: Secondary | ICD-10-CM | POA: Diagnosis not present

## 2012-06-12 DIAGNOSIS — M159 Polyosteoarthritis, unspecified: Secondary | ICD-10-CM | POA: Diagnosis not present

## 2012-06-12 DIAGNOSIS — Z0000 Encounter for general adult medical examination without abnormal findings: Secondary | ICD-10-CM | POA: Diagnosis not present

## 2012-06-12 DIAGNOSIS — R7309 Other abnormal glucose: Secondary | ICD-10-CM | POA: Diagnosis not present

## 2012-06-18 ENCOUNTER — Other Ambulatory Visit: Payer: Self-pay | Admitting: Internal Medicine

## 2012-06-18 DIAGNOSIS — M5137 Other intervertebral disc degeneration, lumbosacral region: Secondary | ICD-10-CM | POA: Diagnosis not present

## 2012-06-18 DIAGNOSIS — M159 Polyosteoarthritis, unspecified: Secondary | ICD-10-CM | POA: Diagnosis not present

## 2012-06-18 DIAGNOSIS — H908 Mixed conductive and sensorineural hearing loss, unspecified: Secondary | ICD-10-CM | POA: Diagnosis not present

## 2012-06-18 DIAGNOSIS — I8393 Asymptomatic varicose veins of bilateral lower extremities: Secondary | ICD-10-CM

## 2012-06-18 DIAGNOSIS — R7309 Other abnormal glucose: Secondary | ICD-10-CM | POA: Diagnosis not present

## 2012-06-18 DIAGNOSIS — I10 Essential (primary) hypertension: Secondary | ICD-10-CM | POA: Diagnosis not present

## 2012-06-18 DIAGNOSIS — I8390 Asymptomatic varicose veins of unspecified lower extremity: Secondary | ICD-10-CM

## 2012-06-18 DIAGNOSIS — N951 Menopausal and female climacteric states: Secondary | ICD-10-CM | POA: Diagnosis not present

## 2012-06-20 ENCOUNTER — Other Ambulatory Visit: Payer: Self-pay | Admitting: Internal Medicine

## 2012-06-20 ENCOUNTER — Ambulatory Visit
Admission: RE | Admit: 2012-06-20 | Discharge: 2012-06-20 | Disposition: A | Discharge status: Home / Self Care | Payer: Medicare Other | Source: Ambulatory Visit | Attending: Internal Medicine | Admitting: Internal Medicine

## 2012-06-20 DIAGNOSIS — I8390 Asymptomatic varicose veins of unspecified lower extremity: Secondary | ICD-10-CM

## 2012-06-20 DIAGNOSIS — I83893 Varicose veins of bilateral lower extremities with other complications: Secondary | ICD-10-CM | POA: Diagnosis not present

## 2012-06-20 DIAGNOSIS — I8393 Asymptomatic varicose veins of bilateral lower extremities: Secondary | ICD-10-CM

## 2012-06-25 ENCOUNTER — Other Ambulatory Visit (HOSPITAL_COMMUNITY): Payer: Self-pay | Admitting: Interventional Radiology

## 2012-06-25 ENCOUNTER — Other Ambulatory Visit: Payer: Medicare Other

## 2012-06-26 ENCOUNTER — Other Ambulatory Visit (HOSPITAL_COMMUNITY): Payer: Self-pay | Admitting: Interventional Radiology

## 2012-06-26 DIAGNOSIS — I83812 Varicose veins of left lower extremities with pain: Secondary | ICD-10-CM

## 2012-06-28 DIAGNOSIS — M48061 Spinal stenosis, lumbar region without neurogenic claudication: Secondary | ICD-10-CM | POA: Diagnosis not present

## 2012-07-03 ENCOUNTER — Ambulatory Visit
Admission: RE | Admit: 2012-07-03 | Discharge: 2012-07-03 | Disposition: A | Discharge status: Home / Self Care | Payer: Medicare Other | Source: Ambulatory Visit | Attending: Interventional Radiology | Admitting: Interventional Radiology

## 2012-07-03 VITALS — BP 154/65 | HR 68 | Temp 97.9°F | Resp 14 | Ht 63.0 in | Wt 140.0 lb

## 2012-07-03 DIAGNOSIS — I83893 Varicose veins of bilateral lower extremities with other complications: Secondary | ICD-10-CM

## 2012-07-03 DIAGNOSIS — I83812 Varicose veins of left lower extremities with pain: Secondary | ICD-10-CM

## 2012-07-03 DIAGNOSIS — I8390 Asymptomatic varicose veins of unspecified lower extremity: Secondary | ICD-10-CM | POA: Diagnosis not present

## 2012-07-03 HISTORY — DX: Varicose veins of left lower extremity with pain: I83.812

## 2012-07-03 MED ORDER — DIAZEPAM 5 MG PO TABS: 5.0000 mg | ORAL_TABLET | Freq: Once | ORAL | Status: DC

## 2012-07-03 NOTE — Progress Notes (Signed)
1230  Informed consent obtained.  1240  Patient given verbal & written discharged instructions & states that she understands.  1250  22 g x 1" insyte angiocath started IV Left antecubital (on 1st attempt) w/ NSL & 7" extension.  Flushed w/ 10 mL NSS w/o difficulty.  Site unremarkable.  1255  Valium 5 mg po.    1440  Procedure completed.  Patient tolerated well.    1445  Wearing thigh high graduated compression garment on LLE (20-30 mm Hg).  1450  IV DC'd, catheter intact.  Site unremarkable.    1455 Ambulated x 10 minutes w/o assistance.  Gait steady.  54  Daughter Rosey Bath) available to drive.  Patient discharged to home.    Chan Rosasco Carmell Austria, Charity fundraiser

## 2012-07-04 ENCOUNTER — Ambulatory Visit: Payer: Medicare Other

## 2012-07-04 ENCOUNTER — Other Ambulatory Visit: Payer: Medicare Other

## 2012-07-10 ENCOUNTER — Ambulatory Visit
Admission: RE | Admit: 2012-07-10 | Discharge: 2012-07-10 | Disposition: A | Discharge status: Home / Self Care | Payer: Medicare Other | Source: Ambulatory Visit | Attending: Interventional Radiology | Admitting: Interventional Radiology

## 2012-07-10 DIAGNOSIS — I83812 Varicose veins of left lower extremities with pain: Secondary | ICD-10-CM

## 2012-07-10 DIAGNOSIS — I82819 Embolism and thrombosis of superficial veins of unspecified lower extremities: Secondary | ICD-10-CM | POA: Diagnosis not present

## 2012-07-24 ENCOUNTER — Ambulatory Visit
Admission: RE | Admit: 2012-07-24 | Discharge: 2012-07-24 | Disposition: A | Discharge status: Home / Self Care | Payer: Medicare Other | Source: Ambulatory Visit | Attending: Interventional Radiology | Admitting: Interventional Radiology

## 2012-07-24 DIAGNOSIS — I83893 Varicose veins of bilateral lower extremities with other complications: Secondary | ICD-10-CM | POA: Diagnosis not present

## 2012-07-24 DIAGNOSIS — I83812 Varicose veins of left lower extremities with pain: Secondary | ICD-10-CM

## 2012-07-24 DIAGNOSIS — I82819 Embolism and thrombosis of superficial veins of unspecified lower extremities: Secondary | ICD-10-CM | POA: Diagnosis not present

## 2012-07-25 ENCOUNTER — Other Ambulatory Visit: Payer: Medicare Other

## 2012-07-25 ENCOUNTER — Ambulatory Visit: Payer: Medicare Other

## 2012-09-12 DIAGNOSIS — Z1231 Encounter for screening mammogram for malignant neoplasm of breast: Secondary | ICD-10-CM | POA: Diagnosis not present

## 2012-10-18 ENCOUNTER — Encounter (HOSPITAL_COMMUNITY): Payer: Self-pay

## 2012-10-18 ENCOUNTER — Emergency Department (HOSPITAL_COMMUNITY)
Admission: EM | Admit: 2012-10-18 | Discharge: 2012-10-18 | Disposition: A | Discharge status: Home / Self Care | Payer: Medicare Other | Attending: Emergency Medicine | Admitting: Emergency Medicine

## 2012-10-18 DIAGNOSIS — Z23 Encounter for immunization: Secondary | ICD-10-CM | POA: Diagnosis not present

## 2012-10-18 DIAGNOSIS — Z85828 Personal history of other malignant neoplasm of skin: Secondary | ICD-10-CM | POA: Insufficient documentation

## 2012-10-18 DIAGNOSIS — I10 Essential (primary) hypertension: Secondary | ICD-10-CM | POA: Diagnosis not present

## 2012-10-18 DIAGNOSIS — Z8679 Personal history of other diseases of the circulatory system: Secondary | ICD-10-CM | POA: Insufficient documentation

## 2012-10-18 DIAGNOSIS — IMO0002 Reserved for concepts with insufficient information to code with codable children: Secondary | ICD-10-CM | POA: Insufficient documentation

## 2012-10-18 DIAGNOSIS — Y9389 Activity, other specified: Secondary | ICD-10-CM | POA: Insufficient documentation

## 2012-10-18 DIAGNOSIS — Z79899 Other long term (current) drug therapy: Secondary | ICD-10-CM | POA: Insufficient documentation

## 2012-10-18 DIAGNOSIS — Z85048 Personal history of other malignant neoplasm of rectum, rectosigmoid junction, and anus: Secondary | ICD-10-CM | POA: Diagnosis not present

## 2012-10-18 DIAGNOSIS — Z7982 Long term (current) use of aspirin: Secondary | ICD-10-CM | POA: Insufficient documentation

## 2012-10-18 DIAGNOSIS — Z8719 Personal history of other diseases of the digestive system: Secondary | ICD-10-CM | POA: Diagnosis not present

## 2012-10-18 DIAGNOSIS — Z8739 Personal history of other diseases of the musculoskeletal system and connective tissue: Secondary | ICD-10-CM | POA: Insufficient documentation

## 2012-10-18 DIAGNOSIS — T148XXA Other injury of unspecified body region, initial encounter: Secondary | ICD-10-CM

## 2012-10-18 DIAGNOSIS — Y92410 Unspecified street and highway as the place of occurrence of the external cause: Secondary | ICD-10-CM | POA: Insufficient documentation

## 2012-10-18 MED ORDER — TETANUS-DIPHTH-ACELL PERTUSSIS 5-2.5-18.5 LF-MCG/0.5 IM SUSP
0.5000 mL | Freq: Once | INTRAMUSCULAR | Status: AC
  Administered 2012-10-18: 0.5 mL via INTRAMUSCULAR
  Filled 2012-10-18: qty 0.5

## 2012-10-18 MED ORDER — IBUPROFEN 600 MG PO TABS: 600.0000 mg | ORAL_TABLET | Freq: Four times a day (QID) | ORAL | Status: DC | PRN

## 2012-10-18 MED ORDER — CYCLOBENZAPRINE HCL 5 MG PO TABS: 5.0000 mg | ORAL_TABLET | Freq: Two times a day (BID) | ORAL | Status: DC | PRN
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
## 2012-10-18 NOTE — ED Provider Notes (Signed)
Medical screening examination/treatment/procedure(s) were conducted as a shared visit with non-physician practitioner(s) and myself.  I personally evaluated the patient during the encounter.  Patient denies complaints, cervical spine nontender, upper abdomen soft and nontender, minor left facial abrasion, patient requests tetanus shot in the emergency department.  Hurman Horn, MD 10/18/12 [phone]
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
## 2012-10-22 DIAGNOSIS — R079 Chest pain, unspecified: Secondary | ICD-10-CM | POA: Diagnosis not present

## 2012-10-22 DIAGNOSIS — M2550 Pain in unspecified joint: Secondary | ICD-10-CM | POA: Diagnosis not present

## 2012-10-22 DIAGNOSIS — IMO0001 Reserved for inherently not codable concepts without codable children: Secondary | ICD-10-CM | POA: Diagnosis not present

## 2012-11-06 DIAGNOSIS — H524 Presbyopia: Secondary | ICD-10-CM | POA: Diagnosis not present

## 2012-11-06 DIAGNOSIS — H43819 Vitreous degeneration, unspecified eye: Secondary | ICD-10-CM | POA: Diagnosis not present

## 2012-11-06 DIAGNOSIS — Z961 Presence of intraocular lens: Secondary | ICD-10-CM | POA: Diagnosis not present

## 2012-11-29 DIAGNOSIS — R7309 Other abnormal glucose: Secondary | ICD-10-CM | POA: Diagnosis not present

## 2012-11-29 DIAGNOSIS — M159 Polyosteoarthritis, unspecified: Secondary | ICD-10-CM | POA: Diagnosis not present

## 2012-11-29 DIAGNOSIS — I10 Essential (primary) hypertension: Secondary | ICD-10-CM | POA: Diagnosis not present

## 2012-12-06 DIAGNOSIS — R7309 Other abnormal glucose: Secondary | ICD-10-CM | POA: Diagnosis not present

## 2012-12-06 DIAGNOSIS — M159 Polyosteoarthritis, unspecified: Secondary | ICD-10-CM | POA: Diagnosis not present

## 2012-12-06 DIAGNOSIS — I10 Essential (primary) hypertension: Secondary | ICD-10-CM | POA: Diagnosis not present

## 2012-12-06 DIAGNOSIS — M5137 Other intervertebral disc degeneration, lumbosacral region: Secondary | ICD-10-CM | POA: Diagnosis not present

## 2012-12-10 ENCOUNTER — Other Ambulatory Visit (HOSPITAL_COMMUNITY): Payer: Self-pay | Admitting: Interventional Radiology

## 2012-12-10 DIAGNOSIS — I83813 Varicose veins of bilateral lower extremities with pain: Secondary | ICD-10-CM

## 2012-12-16 DIAGNOSIS — R5381 Other malaise: Secondary | ICD-10-CM | POA: Diagnosis not present

## 2012-12-16 DIAGNOSIS — N390 Urinary tract infection, site not specified: Secondary | ICD-10-CM | POA: Diagnosis not present

## 2012-12-16 DIAGNOSIS — R300 Dysuria: Secondary | ICD-10-CM | POA: Diagnosis not present

## 2012-12-26 ENCOUNTER — Ambulatory Visit
Admission: RE | Admit: 2012-12-26 | Discharge: 2012-12-26 | Disposition: A | Discharge status: Home / Self Care | Payer: Medicare Other | Source: Ambulatory Visit | Attending: Interventional Radiology | Admitting: Interventional Radiology

## 2012-12-26 DIAGNOSIS — I83813 Varicose veins of bilateral lower extremities with pain: Secondary | ICD-10-CM

## 2012-12-26 DIAGNOSIS — I82819 Embolism and thrombosis of superficial veins of unspecified lower extremities: Secondary | ICD-10-CM | POA: Diagnosis not present

## 2012-12-27 ENCOUNTER — Encounter (HOSPITAL_COMMUNITY): Payer: Self-pay | Admitting: Emergency Medicine

## 2012-12-27 ENCOUNTER — Emergency Department (INDEPENDENT_AMBULATORY_CARE_PROVIDER_SITE_OTHER): Payer: Medicare Other

## 2012-12-27 ENCOUNTER — Emergency Department (INDEPENDENT_AMBULATORY_CARE_PROVIDER_SITE_OTHER)
Admission: EM | Admit: 2012-12-27 | Discharge: 2012-12-27 | Disposition: A | Discharge status: Home / Self Care | Payer: Medicare Other | Source: Home / Self Care | Attending: Family Medicine | Admitting: Family Medicine

## 2012-12-27 DIAGNOSIS — M79609 Pain in unspecified limb: Secondary | ICD-10-CM | POA: Diagnosis not present

## 2012-12-27 DIAGNOSIS — IMO0002 Reserved for concepts with insufficient information to code with codable children: Secondary | ICD-10-CM

## 2012-12-27 DIAGNOSIS — S298XXA Other specified injuries of thorax, initial encounter: Secondary | ICD-10-CM | POA: Diagnosis not present

## 2012-12-27 DIAGNOSIS — S0993XA Unspecified injury of face, initial encounter: Secondary | ICD-10-CM | POA: Diagnosis not present

## 2012-12-27 DIAGNOSIS — T148XXA Other injury of unspecified body region, initial encounter: Secondary | ICD-10-CM

## 2012-12-27 DIAGNOSIS — M25529 Pain in unspecified elbow: Secondary | ICD-10-CM | POA: Diagnosis not present

## 2012-12-27 DIAGNOSIS — S59909A Unspecified injury of unspecified elbow, initial encounter: Secondary | ICD-10-CM | POA: Diagnosis not present

## 2012-12-27 DIAGNOSIS — R079 Chest pain, unspecified: Secondary | ICD-10-CM | POA: Diagnosis not present

## 2012-12-27 DIAGNOSIS — M542 Cervicalgia: Secondary | ICD-10-CM | POA: Diagnosis not present

## 2012-12-27 DIAGNOSIS — M79601 Pain in right arm: Secondary | ICD-10-CM

## 2012-12-27 MED ORDER — TRAMADOL HCL 50 MG PO TABS: 50.0000 mg | ORAL_TABLET | Freq: Four times a day (QID) | ORAL | Status: DC | PRN
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
## 2012-12-31 ENCOUNTER — Encounter (HOSPITAL_COMMUNITY): Payer: Self-pay | Admitting: Emergency Medicine

## 2012-12-31 ENCOUNTER — Emergency Department (INDEPENDENT_AMBULATORY_CARE_PROVIDER_SITE_OTHER)
Admission: EM | Admit: 2012-12-31 | Discharge: 2012-12-31 | Disposition: A | Discharge status: Home / Self Care | Payer: Self-pay | Source: Home / Self Care | Attending: Family Medicine | Admitting: Family Medicine

## 2012-12-31 DIAGNOSIS — S20212D Contusion of left front wall of thorax, subsequent encounter: Secondary | ICD-10-CM

## 2012-12-31 DIAGNOSIS — Z5189 Encounter for other specified aftercare: Secondary | ICD-10-CM

## 2012-12-31 DIAGNOSIS — IMO0002 Reserved for concepts with insufficient information to code with codable children: Secondary | ICD-10-CM

## 2012-12-31 DIAGNOSIS — T148XXA Other injury of unspecified body region, initial encounter: Secondary | ICD-10-CM

## 2012-12-31 MED ORDER — HYDROCODONE-ACETAMINOPHEN 5-325 MG PO TABS: 1.0000 | ORAL_TABLET | Freq: Every evening | ORAL | Status: DC | PRN
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
## 2013-01-22 ENCOUNTER — Emergency Department (HOSPITAL_COMMUNITY): Payer: Medicare Other

## 2013-01-22 ENCOUNTER — Emergency Department (INDEPENDENT_AMBULATORY_CARE_PROVIDER_SITE_OTHER)
Admission: EM | Admit: 2013-01-22 | Discharge: 2013-01-22 | Disposition: A | Discharge status: Home / Self Care | Payer: Self-pay | Source: Home / Self Care | Attending: Family Medicine | Admitting: Family Medicine

## 2013-01-22 ENCOUNTER — Encounter (HOSPITAL_COMMUNITY): Payer: Self-pay | Admitting: Emergency Medicine

## 2013-01-22 ENCOUNTER — Emergency Department (INDEPENDENT_AMBULATORY_CARE_PROVIDER_SITE_OTHER): Payer: Medicare Other

## 2013-01-22 DIAGNOSIS — S20219S Contusion of unspecified front wall of thorax, sequela: Secondary | ICD-10-CM

## 2013-01-22 DIAGNOSIS — M79609 Pain in unspecified limb: Secondary | ICD-10-CM | POA: Diagnosis not present

## 2013-01-22 DIAGNOSIS — IMO0002 Reserved for concepts with insufficient information to code with codable children: Secondary | ICD-10-CM

## 2013-01-22 DIAGNOSIS — T148XXA Other injury of unspecified body region, initial encounter: Secondary | ICD-10-CM

## 2013-01-22 DIAGNOSIS — R079 Chest pain, unspecified: Secondary | ICD-10-CM | POA: Diagnosis not present
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
## 2013-01-22 NOTE — ED Notes (Signed)
Patient self d/c after discussion of instructions w MD

## 2013-03-17 DIAGNOSIS — I10 Essential (primary) hypertension: Secondary | ICD-10-CM | POA: Diagnosis not present

## 2013-03-17 DIAGNOSIS — R05 Cough: Secondary | ICD-10-CM | POA: Diagnosis not present

## 2013-03-17 DIAGNOSIS — R413 Other amnesia: Secondary | ICD-10-CM | POA: Diagnosis not present

## 2013-04-02 DIAGNOSIS — D235 Other benign neoplasm of skin of trunk: Secondary | ICD-10-CM | POA: Diagnosis not present

## 2013-04-02 DIAGNOSIS — D045 Carcinoma in situ of skin of trunk: Secondary | ICD-10-CM | POA: Diagnosis not present

## 2013-04-02 DIAGNOSIS — L570 Actinic keratosis: Secondary | ICD-10-CM | POA: Diagnosis not present

## 2013-04-28 DIAGNOSIS — I10 Essential (primary) hypertension: Secondary | ICD-10-CM | POA: Diagnosis not present

## 2013-04-28 DIAGNOSIS — R059 Cough, unspecified: Secondary | ICD-10-CM | POA: Diagnosis not present

## 2013-04-28 DIAGNOSIS — F0390 Unspecified dementia without behavioral disturbance: Secondary | ICD-10-CM | POA: Diagnosis not present

## 2013-04-28 DIAGNOSIS — R05 Cough: Secondary | ICD-10-CM | POA: Diagnosis not present

## 2013-05-16 DIAGNOSIS — D485 Neoplasm of uncertain behavior of skin: Secondary | ICD-10-CM | POA: Diagnosis not present

## 2013-05-16 DIAGNOSIS — L98499 Non-pressure chronic ulcer of skin of other sites with unspecified severity: Secondary | ICD-10-CM | POA: Diagnosis not present

## 2013-05-16 DIAGNOSIS — Z85828 Personal history of other malignant neoplasm of skin: Secondary | ICD-10-CM | POA: Diagnosis not present

## 2013-05-16 DIAGNOSIS — L570 Actinic keratosis: Secondary | ICD-10-CM | POA: Diagnosis not present

## 2013-05-19 DIAGNOSIS — F0390 Unspecified dementia without behavioral disturbance: Secondary | ICD-10-CM | POA: Diagnosis not present

## 2013-05-19 DIAGNOSIS — I10 Essential (primary) hypertension: Secondary | ICD-10-CM | POA: Diagnosis not present

## 2013-05-19 DIAGNOSIS — R7309 Other abnormal glucose: Secondary | ICD-10-CM | POA: Diagnosis not present

## 2013-07-04 DIAGNOSIS — I10 Essential (primary) hypertension: Secondary | ICD-10-CM | POA: Diagnosis not present

## 2013-07-04 DIAGNOSIS — Z1331 Encounter for screening for depression: Secondary | ICD-10-CM | POA: Diagnosis not present

## 2013-07-04 DIAGNOSIS — N390 Urinary tract infection, site not specified: Secondary | ICD-10-CM | POA: Diagnosis not present

## 2013-07-04 DIAGNOSIS — R7309 Other abnormal glucose: Secondary | ICD-10-CM | POA: Diagnosis not present

## 2013-07-04 DIAGNOSIS — Z0000 Encounter for general adult medical examination without abnormal findings: Secondary | ICD-10-CM | POA: Diagnosis not present

## 2013-07-11 DIAGNOSIS — R7309 Other abnormal glucose: Secondary | ICD-10-CM | POA: Diagnosis not present

## 2013-07-11 DIAGNOSIS — F0390 Unspecified dementia without behavioral disturbance: Secondary | ICD-10-CM | POA: Diagnosis not present

## 2013-07-11 DIAGNOSIS — E782 Mixed hyperlipidemia: Secondary | ICD-10-CM | POA: Diagnosis not present

## 2013-07-11 DIAGNOSIS — I10 Essential (primary) hypertension: Secondary | ICD-10-CM | POA: Diagnosis not present

## 2013-09-05 DIAGNOSIS — E782 Mixed hyperlipidemia: Secondary | ICD-10-CM | POA: Diagnosis not present

## 2013-09-12 DIAGNOSIS — N318 Other neuromuscular dysfunction of bladder: Secondary | ICD-10-CM | POA: Diagnosis not present

## 2013-09-12 DIAGNOSIS — M5430 Sciatica, unspecified side: Secondary | ICD-10-CM | POA: Diagnosis not present

## 2013-09-12 DIAGNOSIS — E782 Mixed hyperlipidemia: Secondary | ICD-10-CM | POA: Diagnosis not present

## 2013-09-12 DIAGNOSIS — I10 Essential (primary) hypertension: Secondary | ICD-10-CM | POA: Diagnosis not present

## 2013-10-29 DIAGNOSIS — N318 Other neuromuscular dysfunction of bladder: Secondary | ICD-10-CM | POA: Diagnosis not present

## 2013-10-30 DIAGNOSIS — Z1231 Encounter for screening mammogram for malignant neoplasm of breast: Secondary | ICD-10-CM | POA: Diagnosis not present

## 2013-11-06 DIAGNOSIS — R928 Other abnormal and inconclusive findings on diagnostic imaging of breast: Secondary | ICD-10-CM | POA: Diagnosis not present

## 2013-11-10 DIAGNOSIS — H5200 Hypermetropia, unspecified eye: Secondary | ICD-10-CM | POA: Diagnosis not present

## 2013-11-10 DIAGNOSIS — Z961 Presence of intraocular lens: Secondary | ICD-10-CM | POA: Diagnosis not present

## 2013-12-02 DIAGNOSIS — Z23 Encounter for immunization: Secondary | ICD-10-CM | POA: Diagnosis not present

## 2013-12-29 DIAGNOSIS — N3281 Overactive bladder: Secondary | ICD-10-CM | POA: Diagnosis not present

## 2013-12-29 DIAGNOSIS — I10 Essential (primary) hypertension: Secondary | ICD-10-CM | POA: Diagnosis not present

## 2013-12-29 DIAGNOSIS — M25512 Pain in left shoulder: Secondary | ICD-10-CM | POA: Diagnosis not present

## 2013-12-29 DIAGNOSIS — M25552 Pain in left hip: Secondary | ICD-10-CM | POA: Diagnosis not present

## 2014-01-07 DIAGNOSIS — M25512 Pain in left shoulder: Secondary | ICD-10-CM | POA: Diagnosis not present

## 2014-01-07 DIAGNOSIS — M545 Low back pain: Secondary | ICD-10-CM | POA: Diagnosis not present

## 2014-01-07 DIAGNOSIS — Z23 Encounter for immunization: Secondary | ICD-10-CM | POA: Diagnosis not present

## 2014-01-07 DIAGNOSIS — M25552 Pain in left hip: Secondary | ICD-10-CM | POA: Diagnosis not present

## 2014-01-07 DIAGNOSIS — I10 Essential (primary) hypertension: Secondary | ICD-10-CM | POA: Diagnosis not present

## 2014-02-10 DIAGNOSIS — B078 Other viral warts: Secondary | ICD-10-CM | POA: Diagnosis not present

## 2014-02-10 DIAGNOSIS — L570 Actinic keratosis: Secondary | ICD-10-CM | POA: Diagnosis not present

## 2014-02-10 DIAGNOSIS — X32XXXD Exposure to sunlight, subsequent encounter: Secondary | ICD-10-CM | POA: Diagnosis not present

## 2014-02-10 DIAGNOSIS — L821 Other seborrheic keratosis: Secondary | ICD-10-CM | POA: Diagnosis not present

## 2014-05-21 DIAGNOSIS — N3946 Mixed incontinence: Secondary | ICD-10-CM | POA: Diagnosis not present

## 2014-05-21 DIAGNOSIS — M545 Low back pain: Secondary | ICD-10-CM | POA: Diagnosis not present

## 2014-05-21 DIAGNOSIS — R1030 Lower abdominal pain, unspecified: Secondary | ICD-10-CM | POA: Diagnosis not present

## 2014-05-25 DIAGNOSIS — R410 Disorientation, unspecified: Secondary | ICD-10-CM | POA: Diagnosis not present

## 2014-05-25 DIAGNOSIS — F0390 Unspecified dementia without behavioral disturbance: Secondary | ICD-10-CM | POA: Diagnosis not present

## 2014-05-25 DIAGNOSIS — N390 Urinary tract infection, site not specified: Secondary | ICD-10-CM | POA: Diagnosis not present

## 2014-06-08 DIAGNOSIS — N390 Urinary tract infection, site not specified: Secondary | ICD-10-CM | POA: Diagnosis not present

## 2014-06-25 DIAGNOSIS — M545 Low back pain: Secondary | ICD-10-CM | POA: Diagnosis not present

## 2014-07-13 DIAGNOSIS — H04123 Dry eye syndrome of bilateral lacrimal glands: Secondary | ICD-10-CM | POA: Diagnosis not present

## 2014-07-16 DIAGNOSIS — E782 Mixed hyperlipidemia: Secondary | ICD-10-CM | POA: Diagnosis not present

## 2014-07-16 DIAGNOSIS — Z0000 Encounter for general adult medical examination without abnormal findings: Secondary | ICD-10-CM | POA: Diagnosis not present

## 2014-07-16 DIAGNOSIS — I10 Essential (primary) hypertension: Secondary | ICD-10-CM | POA: Diagnosis not present

## 2014-07-16 DIAGNOSIS — Z1389 Encounter for screening for other disorder: Secondary | ICD-10-CM | POA: Diagnosis not present

## 2014-07-16 DIAGNOSIS — M159 Polyosteoarthritis, unspecified: Secondary | ICD-10-CM | POA: Diagnosis not present

## 2014-07-16 DIAGNOSIS — R7309 Other abnormal glucose: Secondary | ICD-10-CM | POA: Diagnosis not present

## 2014-07-22 DIAGNOSIS — E782 Mixed hyperlipidemia: Secondary | ICD-10-CM | POA: Diagnosis not present

## 2014-07-22 DIAGNOSIS — I10 Essential (primary) hypertension: Secondary | ICD-10-CM | POA: Diagnosis not present

## 2014-07-22 DIAGNOSIS — M159 Polyosteoarthritis, unspecified: Secondary | ICD-10-CM | POA: Diagnosis not present

## 2014-07-22 DIAGNOSIS — F0390 Unspecified dementia without behavioral disturbance: Secondary | ICD-10-CM | POA: Diagnosis not present

## 2014-10-06 ENCOUNTER — Encounter (HOSPITAL_COMMUNITY): Payer: Self-pay | Admitting: Emergency Medicine

## 2014-10-06 ENCOUNTER — Emergency Department (HOSPITAL_COMMUNITY): Payer: Medicare Other

## 2014-10-06 ENCOUNTER — Emergency Department (INDEPENDENT_AMBULATORY_CARE_PROVIDER_SITE_OTHER)
Admission: EM | Admit: 2014-10-06 | Discharge: 2014-10-06 | Disposition: A | Discharge status: Nursing Home (Includes ICF) | Payer: Medicare Other | Source: Home / Self Care | Attending: Emergency Medicine | Admitting: Emergency Medicine

## 2014-10-06 ENCOUNTER — Emergency Department (HOSPITAL_COMMUNITY)
Admission: EM | Admit: 2014-10-06 | Discharge: 2014-10-06 | Disposition: A | Discharge status: Home / Self Care | Payer: Medicare Other | Attending: Emergency Medicine | Admitting: Emergency Medicine

## 2014-10-06 ENCOUNTER — Encounter (HOSPITAL_COMMUNITY): Payer: Self-pay | Admitting: Neurology

## 2014-10-06 DIAGNOSIS — M79651 Pain in right thigh: Secondary | ICD-10-CM | POA: Diagnosis not present

## 2014-10-06 DIAGNOSIS — Z85828 Personal history of other malignant neoplasm of skin: Secondary | ICD-10-CM | POA: Diagnosis not present

## 2014-10-06 DIAGNOSIS — R55 Syncope and collapse: Secondary | ICD-10-CM | POA: Insufficient documentation

## 2014-10-06 DIAGNOSIS — S79921A Unspecified injury of right thigh, initial encounter: Secondary | ICD-10-CM | POA: Diagnosis not present

## 2014-10-06 DIAGNOSIS — W19XXXA Unspecified fall, initial encounter: Secondary | ICD-10-CM | POA: Diagnosis not present

## 2014-10-06 DIAGNOSIS — S0001XA Abrasion of scalp, initial encounter: Secondary | ICD-10-CM | POA: Insufficient documentation

## 2014-10-06 DIAGNOSIS — M1990 Unspecified osteoarthritis, unspecified site: Secondary | ICD-10-CM | POA: Diagnosis not present

## 2014-10-06 DIAGNOSIS — W01190A Fall on same level from slipping, tripping and stumbling with subsequent striking against furniture, initial encounter: Secondary | ICD-10-CM | POA: Diagnosis not present

## 2014-10-06 DIAGNOSIS — Y999 Unspecified external cause status: Secondary | ICD-10-CM | POA: Insufficient documentation

## 2014-10-06 DIAGNOSIS — S1093XA Contusion of unspecified part of neck, initial encounter: Secondary | ICD-10-CM | POA: Insufficient documentation

## 2014-10-06 DIAGNOSIS — S199XXA Unspecified injury of neck, initial encounter: Secondary | ICD-10-CM | POA: Diagnosis not present

## 2014-10-06 DIAGNOSIS — M25551 Pain in right hip: Secondary | ICD-10-CM | POA: Diagnosis not present

## 2014-10-06 DIAGNOSIS — S79911A Unspecified injury of right hip, initial encounter: Secondary | ICD-10-CM | POA: Insufficient documentation

## 2014-10-06 DIAGNOSIS — Y92002 Bathroom of unspecified non-institutional (private) residence single-family (private) house as the place of occurrence of the external cause: Secondary | ICD-10-CM | POA: Insufficient documentation

## 2014-10-06 DIAGNOSIS — Z8719 Personal history of other diseases of the digestive system: Secondary | ICD-10-CM | POA: Diagnosis not present

## 2014-10-06 DIAGNOSIS — C44500 Unspecified malignant neoplasm of anal skin: Secondary | ICD-10-CM | POA: Diagnosis not present

## 2014-10-06 DIAGNOSIS — Z79899 Other long term (current) drug therapy: Secondary | ICD-10-CM | POA: Insufficient documentation

## 2014-10-06 DIAGNOSIS — Z7982 Long term (current) use of aspirin: Secondary | ICD-10-CM | POA: Insufficient documentation

## 2014-10-06 DIAGNOSIS — I10 Essential (primary) hypertension: Secondary | ICD-10-CM | POA: Diagnosis not present

## 2014-10-06 DIAGNOSIS — S7011XA Contusion of right thigh, initial encounter: Secondary | ICD-10-CM | POA: Insufficient documentation

## 2014-10-06 DIAGNOSIS — S0990XA Unspecified injury of head, initial encounter: Secondary | ICD-10-CM

## 2014-10-06 DIAGNOSIS — M542 Cervicalgia: Secondary | ICD-10-CM | POA: Diagnosis not present

## 2014-10-06 DIAGNOSIS — Y9389 Activity, other specified: Secondary | ICD-10-CM | POA: Diagnosis not present

## 2014-10-06 LAB — CBC
HEMATOCRIT: 37 % (ref 36.0–46.0)
Hemoglobin: 13 g/dL (ref 12.0–15.0)
MCH: 29.5 pg (ref 26.0–34.0)
MCHC: 35.1 g/dL (ref 30.0–36.0)
MCV: 84.1 fL (ref 78.0–100.0)
PLATELETS: 149 10*3/uL — AB (ref 150–400)
RBC: 4.4 MIL/uL (ref 3.87–5.11)
RDW: 13.2 % (ref 11.5–15.5)
WBC: 5.5 10*3/uL (ref 4.0–10.5)

## 2014-10-06 LAB — BASIC METABOLIC PANEL
Anion gap: 5 (ref 5–15)
BUN: 12 mg/dL (ref 6–20)
CO2: 29 mmol/L (ref 22–32)
Calcium: 9.9 mg/dL (ref 8.9–10.3)
Chloride: 106 mmol/L (ref 101–111)
Creatinine, Ser: 0.86 mg/dL (ref 0.44–1.00)
GFR calc non Af Amer: >60 mL/min (ref >60)
Glucose, Bld: 98 mg/dL (ref 65–99)
POTASSIUM: 4.4 mmol/L (ref 3.5–5.1)
Sodium: 140 mmol/L (ref 135–145)

## 2014-10-06 NOTE — Discharge Instructions (Signed)

## 2014-10-06 NOTE — ED Provider Notes (Signed)
CSN: [id number]     Arrival date & time 10/06/14  1347 History   First MD Initiated Contact with Patient 10/06/14 1359     Chief Complaint  Patient presents with  . Fall   (Consider location/radiation/quality/duration/timing/severity/associated sxs/prior Treatment) HPI  She is an 81 year old woman here for evaluation after a fall. She states 2 days ago she either fell out of bed or fell getting into bed. She states she thinks she hit her head on the bedside table and landed on her right hip. She is not sure if she lost consciousness or not. She reports a large knot on the right side of her head. This has since gone down. She also reports pain and bruising in the right hip and thigh. She is on a baby aspirin daily. She denies any change in her vision, numbness, tingling, weakness. No difficulty swallowing or confusion. No change in her speech. She is able to walk, but does experience some discomfort in her right hip. The right hip is most painful when she lays on her right side.  Past Medical History  Diagnosis Date  . Anal cancer 2004    Non-surgical mgt with chemo-radiation.  Dr Alen Blew follows.   . Hypertension   . OA (osteoarthritis)   . Squamous cell carcinoma     skin  . Anal fissure   . Colon, diverticulosis     Sigmoid. With associated stenosis.  Colonoscopy failed to intubate beyond stenosis in 08/2010.  rectal bx then negative for recurrent cancer.   . Varicose veins of left lower extremity with pain    Past Surgical History  Procedure Laterality Date  . Nephrectomy  1970's    Rt. "Shrivelled up" ,  no cancer.   . Abdominal hysterectomy    . Bladder repair    . Polypectomy    . Appendectomy    . Cataract extraction  05/2004     dr Ishmael Holter   Family History  Problem Relation Age of Onset  . Coronary artery disease Father   . Leukemia Brother   . Colon cancer Brother   . Breast cancer Sister   . Ovarian cancer Sister   . Diabetes Daughter    History  Substance Use  Topics  . Smoking status: Never Smoker   . Smokeless tobacco: Never Used  . Alcohol Use: No   OB History    No data available     Review of Systems As in history of present illness Allergies  Review of patient's allergies indicates no known allergies.  Home Medications   Prior to Admission medications   Medication Sig Start Date End Date Taking? Authorizing Provider  aspirin EC 81 MG tablet Take 81 mg by mouth daily.    Historical Provider, MD  calcium carbonate (OS-CAL) 600 MG TABS Take 600 mg by mouth 2 (two) times daily with a meal.      Historical Provider, MD  cyclobenzaprine (FLEXERIL) 5 MG tablet Take 1 tablet (5 mg total) by mouth 2 (two) times daily as needed for muscle spasms. 10/18/12   Charlann Lange, PA-C  diazepam (VALIUM) 5 MG tablet Take 5 mg by mouth every 6 (six) hours as needed. For anxiety    Historical Provider, MD  HYDROcodone-acetaminophen (NORCO/VICODIN) 5-325 MG per tablet Take 1-2 tablets by mouth at bedtime as needed for pain. 12/31/12   Gregor Hams, MD  ibuprofen (ADVIL,MOTRIN) 600 MG tablet Take 1 tablet (600 mg total) by mouth every 6 (six) hours as needed for  pain. 10/18/12   Charlann Lange, PA-C  Misc Natural Products (OSTEO BI-FLEX ADV JOINT SHIELD PO) Take 1 tablet by mouth daily.      Historical Provider, MD  Multiple Vitamin (MULITIVITAMIN WITH MINERALS) TABS Take 1 tablet by mouth daily.    Historical Provider, MD  Omega-3 Fatty Acids (FISH OIL) 1000 MG CAPS Take 1 capsule by mouth daily.      Historical Provider, MD  sertraline (ZOLOFT) 50 MG tablet Take 50 mg by mouth daily.      Historical Provider, MD  traMADol (ULTRAM) 50 MG tablet Take 1 tablet (50 mg total) by mouth every 6 (six) hours as needed for pain. 12/27/12   Gregor Hams, MD  vitamin C (ASCORBIC ACID) 500 MG tablet Take 500 mg by mouth daily.      Historical Provider, MD  vitamin E 200 UNIT capsule Take 200 Units by mouth 3 (three) times daily.      Historical Provider, MD   BP 172/68  mmHg  Pulse 67  Temp(Src) 98.3 F (36.8 C) (Oral)  Resp 16  SpO2 100% Physical Exam  Constitutional: She is oriented to person, place, and time. She appears well-developed and well-nourished. No distress.  HENT:  Head: Head is with Battle's sign.    Eyes: Conjunctivae and EOM are normal. Pupils are equal, round, and reactive to light.  Neck: Neck supple.  Cardiovascular: Normal rate.   Pulmonary/Chest: Effort normal.  Musculoskeletal:       Legs: Right hip: She has full active range of motion with minimal discomfort.  Neurological: She is alert and oriented to person, place, and time. No cranial nerve deficit. She exhibits normal muscle tone. Coordination normal.    ED Course  Procedures (including critical care time) Labs Review Labs Reviewed - No data to display  Imaging Review No results found.   MDM   1. Head injury, initial encounter   2. Hip injury, right, initial encounter   3. Fall, initial encounter    I am concerned for possible basilar skull fracture given bruising over the right mastoid.  This may be due to local tissue injury, but with unclear recollection of fall and possible LOC, will transfer to [facility] shuttle for additional evaluation.  Neurologic exam is normal.  She also has significant bruising of the right thigh.  I doubt fracture given that she is weight bearing with minimal discomfort.   Melony Overly, MD 10/06/14 [phone]

## 2014-10-06 NOTE — ED Notes (Signed)
[facility] called to advise patient was coming in.   Patient states she had a mechanical fall on Sunday and hit head, patient has extensive bruising.

## 2014-10-06 NOTE — ED Provider Notes (Signed)
CSN: [id number]     Arrival date & time 10/06/14  1433 History   First MD Initiated Contact with Patient 10/06/14 1744     Chief Complaint  Patient presents with  . Fall     (Consider location/radiation/quality/duration/timing/severity/associated sxs/prior Treatment) HPI Patient is a in 81 year old female with a history of hypertension presenting today after a fall 2 days ago. Patient reports she was getting into bed when she fell and woke up on the floor. She reports striking her head at that time and landing and hitting her hip. She does not recall the event. She denies any chest pain, shortness of breath, palpitations, prodrome to the episode. Unsure if she passed out before or after she hit her head. She denies any history of heart attacks or previous similar episodes. She denies any numbness tingling, weakness after the event. She was able to ambulate fine over the past 2 days but was having mild pain in her right hip with bruising. Also daughter reports a knot on the back of her head. She was seen at urgent care today and advised to come to the emergency department for imaging.  Past Medical History  Diagnosis Date  . Anal cancer 2004    Non-surgical mgt with chemo-radiation.  Dr Alen Blew follows.   . Hypertension   . OA (osteoarthritis)   . Squamous cell carcinoma     skin  . Anal fissure   . Colon, diverticulosis     Sigmoid. With associated stenosis.  Colonoscopy failed to intubate beyond stenosis in 08/2010.  rectal bx then negative for recurrent cancer.   . Varicose veins of left lower extremity with pain    Past Surgical History  Procedure Laterality Date  . Nephrectomy  1970's    Rt. "Shrivelled up" ,  no cancer.   . Abdominal hysterectomy    . Bladder repair    . Polypectomy    . Appendectomy    . Cataract extraction  05/2004     dr Ishmael Holter   Family History  Problem Relation Age of Onset  . Coronary artery disease Father   . Leukemia Brother   . Colon cancer Brother    . Breast cancer Sister   . Ovarian cancer Sister   . Diabetes Daughter    History  Substance Use Topics  . Smoking status: Never Smoker   . Smokeless tobacco: Never Used  . Alcohol Use: No   OB History    No data available     Review of Systems  Constitutional: Negative for fever and chills.  HENT: Negative for congestion and sore throat.   Eyes: Negative for pain.  Respiratory: Negative for cough and shortness of breath.   Cardiovascular: Negative for chest pain and palpitations.  Gastrointestinal: Negative for nausea, vomiting, abdominal pain and diarrhea.  Genitourinary: Negative for dysuria and flank pain.  Musculoskeletal: Positive for back pain (right hip pain) and neck pain. Negative for gait problem.  Skin: Positive for color change (diffuse bruising of neck and right leg). Negative for rash.  Allergic/Immunologic: Negative.   Neurological: Positive for syncope. Negative for dizziness and light-headedness.  Psychiatric/Behavioral: Negative for confusion.   Allergies  Review of patient's allergies indicates no known allergies.  Home Medications   Prior to Admission medications   Medication Sig Start Date End Date Taking? Authorizing Provider  aspirin EC 81 MG tablet Take 81 mg by mouth daily.   Yes Historical Provider, MD  atorvastatin (LIPITOR) 20 MG tablet Take 20 mg by  mouth daily. 09/30/14  Yes Historical Provider, MD  Multiple Vitamin (MULITIVITAMIN WITH MINERALS) TABS Take 1 tablet by mouth daily.   Yes Historical Provider, MD  naproxen sodium (ANAPROX) 220 MG tablet Take 440 mg by mouth 2 (two) times daily as needed (for pain).   Yes Historical Provider, MD  Omega-3 Fatty Acids (FISH OIL) 1000 MG CAPS Take 1 capsule by mouth daily.     Yes Historical Provider, MD  venlafaxine (EFFEXOR) 37.5 MG tablet Take 37.5 mg by mouth 2 (two) times daily. 09/05/14  Yes Historical Provider, MD  calcium carbonate (OS-CAL) 600 MG TABS Take 600 mg by mouth 2 (two) times daily  with a meal.      Historical Provider, MD  cyclobenzaprine (FLEXERIL) 5 MG tablet Take 1 tablet (5 mg total) by mouth 2 (two) times daily as needed for muscle spasms. Patient not taking: Reported on 10/06/2014 10/18/12   Charlann Lange, PA-C  diazepam (VALIUM) 5 MG tablet Take 5 mg by mouth every 6 (six) hours as needed. For anxiety    Historical Provider, MD  donepezil (ARICEPT) 10 MG tablet Take 10 mg by mouth daily. 09/25/14   Historical Provider, MD  HYDROcodone-acetaminophen (NORCO/VICODIN) 5-325 MG per tablet Take 1-2 tablets by mouth at bedtime as needed for pain. Patient not taking: Reported on 10/06/2014 12/31/12   Gregor Hams, MD  ibuprofen (ADVIL,MOTRIN) 600 MG tablet Take 1 tablet (600 mg total) by mouth every 6 (six) hours as needed for pain. Patient not taking: Reported on 10/06/2014 10/18/12   Charlann Lange, PA-C  Misc Natural Products (OSTEO BI-FLEX ADV JOINT SHIELD PO) Take 1 tablet by mouth daily.      Historical Provider, MD  oxybutynin (DITROPAN) 5 MG tablet Take 5 mg by mouth 2 (two) times daily. 09/08/14   Historical Provider, MD  sertraline (ZOLOFT) 50 MG tablet Take 50 mg by mouth daily.      Historical Provider, MD  traMADol (ULTRAM) 50 MG tablet Take 1 tablet (50 mg total) by mouth every 6 (six) hours as needed for pain. Patient not taking: Reported on 10/06/2014 12/27/12   Gregor Hams, MD  vitamin C (ASCORBIC ACID) 500 MG tablet Take 500 mg by mouth daily.      Historical Provider, MD  vitamin E 200 UNIT capsule Take 200 Units by mouth 3 (three) times daily.      Historical Provider, MD   BP 180/63 mmHg  Pulse 68  Temp(Src) 98.1 F (36.7 C) (Oral)  Resp 18  Ht 5\' 3"  (1.6 m)  Wt 135 lb (61.236 kg)  BMI 23.92 kg/m2  SpO2 99% Physical Exam  Constitutional: She is oriented to person, place, and time. She appears well-developed and well-nourished. No distress.  HENT:  Head: Normocephalic.  Abrasion to right occipital scalp with bruising extending over right mastoid and into  neck.   Eyes: Conjunctivae and EOM are normal. Pupils are equal, round, and reactive to light.  Neck: Trachea normal and normal range of motion. Neck supple. Muscular tenderness present. No spinous process tenderness present. Normal range of motion present.    Cardiovascular: Normal rate, regular rhythm and normal heart sounds.   Pulses:      Radial pulses are 2+ on the right side, and 2+ on the left side.       Dorsalis pedis pulses are 2+ on the right side, and 2+ on the left side.  Pulmonary/Chest: Effort normal and breath sounds normal. No respiratory distress.  Abdominal: Soft. Bowel  sounds are normal. There is no tenderness.  Musculoskeletal: Normal range of motion.       Right shoulder: Normal.       Right knee: Normal.       Right ankle: Normal. Achilles tendon normal.       Legs: Bilateral LE neurovascularly intact.    Neurological: She is alert and oriented to person, place, and time. She has normal strength and normal reflexes. She displays normal reflexes. No cranial nerve deficit or sensory deficit. Gait abnormal. GCS eye subscore is 4. GCS verbal subscore is 5. GCS motor subscore is 6.  Normal finger to nose bilaterally.  Rapid alternating movements intact bilaterally.  Normal heal to shin bilaterally.   No pronator drift bilaterally.   Pt falling backward with romberg but aware of falling motion and able to catch self.     Skin: Skin is warm and dry. Purpura and rash noted. She is not diaphoretic.  Extensive bruising of right lateral thigh.   Psychiatric: She has a normal mood and affect.    ED Course  Procedures (including critical care time) Labs Review Labs Reviewed  CBC - Abnormal; Notable for the following:    Platelets 149 (*)    All other components within normal limits  BASIC METABOLIC PANEL    Imaging Review Ct Head Wo Contrast  10/06/2014   CLINICAL DATA:  [place] 2 days ago in bathroom, struck head on dresser, knot on RIGHT side of head  EXAM: CT HEAD  WITHOUT CONTRAST  TECHNIQUE: Contiguous axial images were obtained from the base of the skull through the vertex without intravenous contrast.  COMPARISON:  None  FINDINGS: Generalized atrophy.  Normal ventricular morphology.  No midline shift or mass effect.  Extensive small vessel chronic ischemic changes of deep cerebral white matter.  Significant ossification within the anterior falx.  No intracranial hemorrhage, mass lesion, or evidence acute infarction.  No extra-axial fluid collections.  Atherosclerotic calcifications at the carotid siphons.  Bones and sinuses unremarkable.  IMPRESSION: Atrophy with small vessel chronic ischemic changes of deep cerebral white matter.  No acute intracranial abnormalities.   Electronically Signed   By: Lavonia Dana M.D.   On: 10/06/2014 15:42   Ct Cervical Spine Wo Contrast  10/06/2014   CLINICAL DATA:  fell with Sunday 10/04/14 Having tenderness and soreness over RT side of neck Pt denies numbness  EXAM: CT CERVICAL SPINE WITHOUT CONTRAST  TECHNIQUE: Multidetector CT imaging of the cervical spine was performed without intravenous contrast. Multiplanar CT image reconstructions were also generated.  COMPARISON:  Radiographs 12/27/2012  FINDINGS: Normal alignment. Mild narrowing of interspaces C3-4 and C4-5, moderate narrowing C5-6 with exuberant anterior spurring. Facets are seated. There does appear to be fusion across the C3-4 facets on the left. Facet hypertrophy results in foraminal encroachment right C4-5. Negative for fracture. Visualized lung apices clear. Partially calcified pannus around the C1-2 articulation.  IMPRESSION: 1. Negative for fracture or other acute bone abnormality. 2. Multilevel degenerative changes as enumerated above.   Electronically Signed   By: Lucrezia Europe M.D.   On: 10/06/2014 20:58   Dg Hip Unilat With Pelvis 1v Right  10/06/2014   CLINICAL DATA:  Status post fall getting into the head today. Right hip pain. Initial encounter.  EXAM: DG HIP  (WITH OR WITHOUT PELVIS) 1V RIGHT  COMPARISON:  None.  FINDINGS: There is no evidence of hip fracture or dislocation. Mild to moderate degenerative disease about the hips appears worse on the left.  IMPRESSION: No acute abnormality.   Electronically Signed   By: Inge Rise M.D.   On: 10/06/2014 15:38   Dg Femur, Min 2 Views Right  10/06/2014   CLINICAL DATA:  Patient fell out of bed 2 nights ago with pain posterior right femur since then and bruising to right hip  EXAM: RIGHT FEMUR 2 VIEWS  COMPARISON:  None.  FINDINGS: Atherosclerotic calcification right femoral artery. Mild to moderate right hip arthritis. No evidence of femur fracture or dislocation.  IMPRESSION: No acute findings   Electronically Signed   By: Skipper Cliche M.D.   On: 10/06/2014 21:33     EKG Interpretation   Date/Time:  Tuesday October 06 2014 18:45:05 EDT Ventricular Rate:  72 PR Interval:  224 QRS Duration: 154 QT Interval:  439 QTC Calculation: 480 R Axis:   -72 Text Interpretation:  Sinus rhythm Prolonged PR interval Probable left  atrial enlargement RBBB and LAFB Left ventricular hypertrophy No acute  changes No significant change since last tracing Confirmed by Kathrynn Humble,  MD, Thelma Comp [phone]) on 10/06/2014 7:41:17 PM     MDM   Final diagnoses:  Fall    On arrival to the emergency department patient was hemodynamically stable and in no acute distress. She did have mild paraspinous tenderness on her neck. She had an old abrasion on her head. No signs of raccoon eyes or hemotympanum. She did have bruising over the right mastoid however. CT head performed showing no acute intracranial abnormalities or bleeds. CT cervical spine performed showing no acute fractures. X-ray of her right hip and femur showed no acute fractures or malalignments. Patient was able to stand and ambulate at bedside with only minimal right hip pain. EKG showing no acute ischemic changes arrhythmia or arrhythmic genic potential. No  leukocytosis or anemia. No electrolyte abnormalities to induce seizure. Advised patient to follow-up with her PCP for syncope workup as an outpatient. Patient with help from family members at bedside.  If performed, labs, EKGs, and imaging were reviewed/interpreted by myself and my attending and incorporated into medical decision making.  Discussed pertinent finding with patient or caregiver prior to discharge with no further questions.  Immediate return precautions given and pt or caregiver reports understanding.  Pt care supervised by my attending Dr. Kathrynn Humble.   Geronimo Boot, MD PGY-2  Emergency Medicine     Geronimo Boot, MD 10/07/14 [place], MD 10/08/14 [phone]

## 2014-10-06 NOTE — ED Notes (Signed)
Pt reports Sunday she fell in her bathroom while climbing into her bed, hit head on dresser. Has knot to right side of head, with pain and pain to right hip with bruise. Denies blood thinners, no LOC. Pt is a x 4. In NAD

## 2014-10-06 NOTE — ED Notes (Signed)
MD at bedside. 

## 2014-10-06 NOTE — ED Notes (Signed)
C/o falling States on Sunday she was trying to get in bed when she fell backwards  Did hit head and right side of buttocks and thigh No tx done Right side of buttocks and thigh is bruised

## 2014-10-15 DIAGNOSIS — M25551 Pain in right hip: Secondary | ICD-10-CM | POA: Diagnosis not present

## 2014-10-15 DIAGNOSIS — M791 Myalgia: Secondary | ICD-10-CM | POA: Diagnosis not present

## 2014-10-15 DIAGNOSIS — W19XXXA Unspecified fall, initial encounter: Secondary | ICD-10-CM | POA: Diagnosis not present

## 2014-11-05 DIAGNOSIS — M6281 Muscle weakness (generalized): Secondary | ICD-10-CM | POA: Diagnosis not present

## 2014-11-05 DIAGNOSIS — M1991 Primary osteoarthritis, unspecified site: Secondary | ICD-10-CM | POA: Diagnosis not present

## 2014-11-05 DIAGNOSIS — R296 Repeated falls: Secondary | ICD-10-CM | POA: Diagnosis not present

## 2014-11-05 DIAGNOSIS — M2560 Stiffness of unspecified joint, not elsewhere classified: Secondary | ICD-10-CM | POA: Diagnosis not present

## 2014-11-09 DIAGNOSIS — M2560 Stiffness of unspecified joint, not elsewhere classified: Secondary | ICD-10-CM | POA: Diagnosis not present

## 2014-11-09 DIAGNOSIS — M6281 Muscle weakness (generalized): Secondary | ICD-10-CM | POA: Diagnosis not present

## 2014-11-09 DIAGNOSIS — R296 Repeated falls: Secondary | ICD-10-CM | POA: Diagnosis not present

## 2014-11-09 DIAGNOSIS — M1991 Primary osteoarthritis, unspecified site: Secondary | ICD-10-CM | POA: Diagnosis not present

## 2014-11-12 DIAGNOSIS — M2560 Stiffness of unspecified joint, not elsewhere classified: Secondary | ICD-10-CM | POA: Diagnosis not present

## 2014-11-12 DIAGNOSIS — R296 Repeated falls: Secondary | ICD-10-CM | POA: Diagnosis not present

## 2014-11-12 DIAGNOSIS — M1991 Primary osteoarthritis, unspecified site: Secondary | ICD-10-CM | POA: Diagnosis not present

## 2014-11-12 DIAGNOSIS — M6281 Muscle weakness (generalized): Secondary | ICD-10-CM | POA: Diagnosis not present

## 2014-11-16 DIAGNOSIS — R296 Repeated falls: Secondary | ICD-10-CM | POA: Diagnosis not present

## 2014-11-16 DIAGNOSIS — M6281 Muscle weakness (generalized): Secondary | ICD-10-CM | POA: Diagnosis not present

## 2014-11-16 DIAGNOSIS — M2560 Stiffness of unspecified joint, not elsewhere classified: Secondary | ICD-10-CM | POA: Diagnosis not present

## 2014-11-16 DIAGNOSIS — M1991 Primary osteoarthritis, unspecified site: Secondary | ICD-10-CM | POA: Diagnosis not present

## 2014-11-19 DIAGNOSIS — M6281 Muscle weakness (generalized): Secondary | ICD-10-CM | POA: Diagnosis not present

## 2014-11-19 DIAGNOSIS — R296 Repeated falls: Secondary | ICD-10-CM | POA: Diagnosis not present

## 2014-11-19 DIAGNOSIS — M1991 Primary osteoarthritis, unspecified site: Secondary | ICD-10-CM | POA: Diagnosis not present

## 2014-11-19 DIAGNOSIS — M2560 Stiffness of unspecified joint, not elsewhere classified: Secondary | ICD-10-CM | POA: Diagnosis not present

## 2014-11-26 DIAGNOSIS — R296 Repeated falls: Secondary | ICD-10-CM | POA: Diagnosis not present

## 2014-11-26 DIAGNOSIS — M1991 Primary osteoarthritis, unspecified site: Secondary | ICD-10-CM | POA: Diagnosis not present

## 2014-11-26 DIAGNOSIS — M2560 Stiffness of unspecified joint, not elsewhere classified: Secondary | ICD-10-CM | POA: Diagnosis not present

## 2014-11-26 DIAGNOSIS — M6281 Muscle weakness (generalized): Secondary | ICD-10-CM | POA: Diagnosis not present

## 2014-11-30 DIAGNOSIS — R296 Repeated falls: Secondary | ICD-10-CM | POA: Diagnosis not present

## 2014-11-30 DIAGNOSIS — M1991 Primary osteoarthritis, unspecified site: Secondary | ICD-10-CM | POA: Diagnosis not present

## 2014-11-30 DIAGNOSIS — M2560 Stiffness of unspecified joint, not elsewhere classified: Secondary | ICD-10-CM | POA: Diagnosis not present

## 2014-11-30 DIAGNOSIS — M6281 Muscle weakness (generalized): Secondary | ICD-10-CM | POA: Diagnosis not present

## 2014-11-30 DIAGNOSIS — N390 Urinary tract infection, site not specified: Secondary | ICD-10-CM | POA: Diagnosis not present

## 2014-12-11 DIAGNOSIS — D225 Melanocytic nevi of trunk: Secondary | ICD-10-CM | POA: Diagnosis not present

## 2014-12-11 DIAGNOSIS — C44729 Squamous cell carcinoma of skin of left lower limb, including hip: Secondary | ICD-10-CM | POA: Diagnosis not present

## 2014-12-30 DIAGNOSIS — I10 Essential (primary) hypertension: Secondary | ICD-10-CM | POA: Diagnosis not present

## 2014-12-30 DIAGNOSIS — R739 Hyperglycemia, unspecified: Secondary | ICD-10-CM | POA: Diagnosis not present

## 2014-12-30 DIAGNOSIS — Z23 Encounter for immunization: Secondary | ICD-10-CM | POA: Diagnosis not present

## 2014-12-30 DIAGNOSIS — Z780 Asymptomatic menopausal state: Secondary | ICD-10-CM | POA: Diagnosis not present

## 2014-12-30 DIAGNOSIS — E782 Mixed hyperlipidemia: Secondary | ICD-10-CM | POA: Diagnosis not present

## 2015-01-22 DIAGNOSIS — L980 Pyogenic granuloma: Secondary | ICD-10-CM | POA: Diagnosis not present

## 2015-01-22 DIAGNOSIS — Z85828 Personal history of other malignant neoplasm of skin: Secondary | ICD-10-CM | POA: Diagnosis not present

## 2015-01-22 DIAGNOSIS — B078 Other viral warts: Secondary | ICD-10-CM | POA: Diagnosis not present

## 2015-01-22 DIAGNOSIS — X32XXXD Exposure to sunlight, subsequent encounter: Secondary | ICD-10-CM | POA: Diagnosis not present

## 2015-01-22 DIAGNOSIS — L570 Actinic keratosis: Secondary | ICD-10-CM | POA: Diagnosis not present

## 2015-01-22 DIAGNOSIS — Z08 Encounter for follow-up examination after completed treatment for malignant neoplasm: Secondary | ICD-10-CM | POA: Diagnosis not present

## 2015-01-22 DIAGNOSIS — L821 Other seborrheic keratosis: Secondary | ICD-10-CM | POA: Diagnosis not present

## 2015-02-02 DIAGNOSIS — Z1231 Encounter for screening mammogram for malignant neoplasm of breast: Secondary | ICD-10-CM | POA: Diagnosis not present

## 2015-02-10 DIAGNOSIS — N390 Urinary tract infection, site not specified: Secondary | ICD-10-CM | POA: Diagnosis not present

## 2015-02-15 DIAGNOSIS — N390 Urinary tract infection, site not specified: Secondary | ICD-10-CM | POA: Diagnosis not present

## 2015-04-07 DIAGNOSIS — J22 Unspecified acute lower respiratory infection: Secondary | ICD-10-CM | POA: Diagnosis not present

## 2015-04-07 DIAGNOSIS — R410 Disorientation, unspecified: Secondary | ICD-10-CM | POA: Diagnosis not present

## 2015-04-07 DIAGNOSIS — N390 Urinary tract infection, site not specified: Secondary | ICD-10-CM | POA: Diagnosis not present

## 2015-04-07 DIAGNOSIS — R5383 Other fatigue: Secondary | ICD-10-CM | POA: Diagnosis not present

## 2015-04-16 DIAGNOSIS — J22 Unspecified acute lower respiratory infection: Secondary | ICD-10-CM | POA: Diagnosis not present

## 2015-04-16 DIAGNOSIS — R05 Cough: Secondary | ICD-10-CM | POA: Diagnosis not present

## 2015-04-16 DIAGNOSIS — R5383 Other fatigue: Secondary | ICD-10-CM | POA: Diagnosis not present

## 2015-05-25 DIAGNOSIS — I10 Essential (primary) hypertension: Secondary | ICD-10-CM | POA: Diagnosis not present

## 2015-05-25 DIAGNOSIS — N3946 Mixed incontinence: Secondary | ICD-10-CM | POA: Diagnosis not present

## 2015-05-25 DIAGNOSIS — M5136 Other intervertebral disc degeneration, lumbar region: Secondary | ICD-10-CM | POA: Diagnosis not present

## 2015-05-25 DIAGNOSIS — W19XXXA Unspecified fall, initial encounter: Secondary | ICD-10-CM | POA: Diagnosis not present

## 2015-05-25 DIAGNOSIS — N3281 Overactive bladder: Secondary | ICD-10-CM | POA: Diagnosis not present

## 2015-06-29 DIAGNOSIS — M545 Low back pain: Secondary | ICD-10-CM | POA: Diagnosis not present

## 2015-06-29 DIAGNOSIS — W19XXXA Unspecified fall, initial encounter: Secondary | ICD-10-CM | POA: Diagnosis not present

## 2015-06-29 DIAGNOSIS — M25562 Pain in left knee: Secondary | ICD-10-CM | POA: Diagnosis not present

## 2015-06-29 DIAGNOSIS — M25561 Pain in right knee: Secondary | ICD-10-CM | POA: Diagnosis not present

## 2015-06-29 DIAGNOSIS — R531 Weakness: Secondary | ICD-10-CM | POA: Diagnosis not present

## 2015-06-29 DIAGNOSIS — S3992XA Unspecified injury of lower back, initial encounter: Secondary | ICD-10-CM | POA: Diagnosis not present

## 2015-06-29 DIAGNOSIS — M549 Dorsalgia, unspecified: Secondary | ICD-10-CM | POA: Diagnosis not present

## 2015-07-06 ENCOUNTER — Other Ambulatory Visit: Payer: Self-pay | Admitting: Internal Medicine

## 2015-07-06 DIAGNOSIS — F0390 Unspecified dementia without behavioral disturbance: Secondary | ICD-10-CM

## 2015-07-06 DIAGNOSIS — I10 Essential (primary) hypertension: Secondary | ICD-10-CM | POA: Diagnosis not present

## 2015-07-07 ENCOUNTER — Ambulatory Visit
Admission: RE | Admit: 2015-07-07 | Discharge: 2015-07-07 | Disposition: A | Discharge status: Home / Self Care | Payer: Medicare Other | Source: Ambulatory Visit | Attending: Internal Medicine | Admitting: Internal Medicine

## 2015-07-07 DIAGNOSIS — R296 Repeated falls: Secondary | ICD-10-CM | POA: Diagnosis not present

## 2015-07-07 DIAGNOSIS — F0390 Unspecified dementia without behavioral disturbance: Secondary | ICD-10-CM

## 2015-07-12 DIAGNOSIS — R531 Weakness: Secondary | ICD-10-CM | POA: Diagnosis not present

## 2015-07-12 DIAGNOSIS — M6281 Muscle weakness (generalized): Secondary | ICD-10-CM | POA: Diagnosis not present

## 2015-07-15 DIAGNOSIS — R531 Weakness: Secondary | ICD-10-CM | POA: Diagnosis not present

## 2015-07-15 DIAGNOSIS — M6281 Muscle weakness (generalized): Secondary | ICD-10-CM | POA: Diagnosis not present

## 2015-07-20 DIAGNOSIS — M6281 Muscle weakness (generalized): Secondary | ICD-10-CM | POA: Diagnosis not present

## 2015-07-20 DIAGNOSIS — R531 Weakness: Secondary | ICD-10-CM | POA: Diagnosis not present

## 2015-07-22 DIAGNOSIS — M6281 Muscle weakness (generalized): Secondary | ICD-10-CM | POA: Diagnosis not present

## 2015-07-22 DIAGNOSIS — R531 Weakness: Secondary | ICD-10-CM | POA: Diagnosis not present

## 2015-07-26 DIAGNOSIS — M6281 Muscle weakness (generalized): Secondary | ICD-10-CM | POA: Diagnosis not present

## 2015-07-26 DIAGNOSIS — R531 Weakness: Secondary | ICD-10-CM | POA: Diagnosis not present

## 2015-07-29 DIAGNOSIS — M6281 Muscle weakness (generalized): Secondary | ICD-10-CM | POA: Diagnosis not present

## 2015-07-29 DIAGNOSIS — R531 Weakness: Secondary | ICD-10-CM | POA: Diagnosis not present

## 2015-08-02 DIAGNOSIS — R531 Weakness: Secondary | ICD-10-CM | POA: Diagnosis not present

## 2015-08-02 DIAGNOSIS — M6281 Muscle weakness (generalized): Secondary | ICD-10-CM | POA: Diagnosis not present

## 2015-08-05 DIAGNOSIS — M6281 Muscle weakness (generalized): Secondary | ICD-10-CM | POA: Diagnosis not present

## 2015-08-05 DIAGNOSIS — R531 Weakness: Secondary | ICD-10-CM | POA: Diagnosis not present

## 2015-08-09 ENCOUNTER — Emergency Department (HOSPITAL_COMMUNITY)
Admission: EM | Admit: 2015-08-09 | Discharge: 2015-08-09 | Disposition: A | Discharge status: Home / Self Care | Payer: Medicare Other | Attending: Emergency Medicine | Admitting: Emergency Medicine

## 2015-08-09 ENCOUNTER — Encounter (HOSPITAL_COMMUNITY): Payer: Self-pay | Admitting: Emergency Medicine

## 2015-08-09 ENCOUNTER — Emergency Department (HOSPITAL_COMMUNITY): Payer: Medicare Other

## 2015-08-09 DIAGNOSIS — I10 Essential (primary) hypertension: Secondary | ICD-10-CM | POA: Insufficient documentation

## 2015-08-09 DIAGNOSIS — Y9289 Other specified places as the place of occurrence of the external cause: Secondary | ICD-10-CM | POA: Insufficient documentation

## 2015-08-09 DIAGNOSIS — Z79899 Other long term (current) drug therapy: Secondary | ICD-10-CM | POA: Insufficient documentation

## 2015-08-09 DIAGNOSIS — S0011XA Contusion of right eyelid and periocular area, initial encounter: Secondary | ICD-10-CM | POA: Insufficient documentation

## 2015-08-09 DIAGNOSIS — M1990 Unspecified osteoarthritis, unspecified site: Secondary | ICD-10-CM | POA: Insufficient documentation

## 2015-08-09 DIAGNOSIS — S41111A Laceration without foreign body of right upper arm, initial encounter: Secondary | ICD-10-CM | POA: Diagnosis not present

## 2015-08-09 DIAGNOSIS — W01198A Fall on same level from slipping, tripping and stumbling with subsequent striking against other object, initial encounter: Secondary | ICD-10-CM | POA: Insufficient documentation

## 2015-08-09 DIAGNOSIS — Y998 Other external cause status: Secondary | ICD-10-CM | POA: Diagnosis not present

## 2015-08-09 DIAGNOSIS — W19XXXA Unspecified fall, initial encounter: Secondary | ICD-10-CM

## 2015-08-09 DIAGNOSIS — R51 Headache: Secondary | ICD-10-CM | POA: Diagnosis not present

## 2015-08-09 DIAGNOSIS — Z7982 Long term (current) use of aspirin: Secondary | ICD-10-CM | POA: Diagnosis not present

## 2015-08-09 DIAGNOSIS — S41112A Laceration without foreign body of left upper arm, initial encounter: Secondary | ICD-10-CM | POA: Diagnosis not present

## 2015-08-09 DIAGNOSIS — Z043 Encounter for examination and observation following other accident: Secondary | ICD-10-CM | POA: Diagnosis present

## 2015-08-09 DIAGNOSIS — Z85048 Personal history of other malignant neoplasm of rectum, rectosigmoid junction, and anus: Secondary | ICD-10-CM | POA: Diagnosis not present

## 2015-08-09 DIAGNOSIS — Z8719 Personal history of other diseases of the digestive system: Secondary | ICD-10-CM | POA: Diagnosis not present

## 2015-08-09 DIAGNOSIS — Y9389 Activity, other specified: Secondary | ICD-10-CM | POA: Diagnosis not present

## 2015-08-09 DIAGNOSIS — Z85828 Personal history of other malignant neoplasm of skin: Secondary | ICD-10-CM | POA: Diagnosis not present

## 2015-08-09 DIAGNOSIS — S0993XA Unspecified injury of face, initial encounter: Secondary | ICD-10-CM | POA: Diagnosis not present

## 2015-08-09 DIAGNOSIS — S0990XA Unspecified injury of head, initial encounter: Secondary | ICD-10-CM | POA: Diagnosis not present

## 2015-08-09 MED ORDER — ONDANSETRON 4 MG PO TBDP: ORAL_TABLET | ORAL | Status: DC

## 2015-08-09 NOTE — ED Notes (Signed)
Patient transported to CT 

## 2015-08-09 NOTE — ED Provider Notes (Signed)
CSN: [id number]     Arrival date & time 08/09/15  [id number] History   First MD Initiated Contact with Patient 08/09/15 [phone]     Chief Complaint  Patient presents with  . Fall     (Consider location/radiation/quality/duration/timing/severity/associated sxs/prior Treatment) HPI  82 year old female presents with right facial ecchymosis after a fall 3 nights ago. Patient was getting up to urinate in the middle the night. When she was putting her pants back on she accidentally put both legs and one hole. She then fell when trying to walk. She hit her dresser on the right side of her face. She has been having ecchymosis to the right face and for head. She denies headache or facial pain. Family was concerned due to the amount of ecchymosis. No ocular pain or visual change. Patient has had nausea since the fall but no vomiting. No weakness or numbness. No neck pain. She has trouble ambulating at baseline but nothing is worse since the fall. She has some small skin tears/abrasions that she states is from her dog who often tries to climb on/over her.  Past Medical History  Diagnosis Date  . Anal cancer ([place]) 2004    Non-surgical mgt with chemo-radiation.  Dr Alen Blew follows.   . Hypertension   . OA (osteoarthritis)   . Squamous cell carcinoma ([facility])     skin  . Anal fissure   . Colon, diverticulosis     Sigmoid. With associated stenosis.  Colonoscopy failed to intubate beyond stenosis in 08/2010.  rectal bx then negative for recurrent cancer.   . Varicose veins of left lower extremity with pain    Past Surgical History  Procedure Laterality Date  . Nephrectomy  1970's    Rt. "Shrivelled up" ,  no cancer.   . Abdominal hysterectomy    . Bladder repair    . Polypectomy    . Appendectomy    . Cataract extraction  05/2004     dr Ishmael Holter   Family History  Problem Relation Age of Onset  . Coronary artery disease Father   . Leukemia Brother   . Colon cancer Brother   . Breast cancer Sister   .  Ovarian cancer Sister   . Diabetes Daughter    Social History  Substance Use Topics  . Smoking status: Never Smoker   . Smokeless tobacco: Never Used  . Alcohol Use: No   OB History    No data available     Review of Systems  HENT: Positive for facial swelling.   Gastrointestinal: Positive for nausea. Negative for vomiting.  Musculoskeletal: Negative for neck pain.  Skin: Positive for wound.  Neurological: Negative for dizziness, weakness, numbness and headaches.  All other systems reviewed and are negative.     Allergies  Review of patient's allergies indicates no known allergies.  Home Medications   Prior to Admission medications   Medication Sig Start Date End Date Taking? Authorizing Provider  aspirin EC 81 MG tablet Take 81 mg by mouth daily.    Historical Provider, MD  atorvastatin (LIPITOR) 20 MG tablet Take 20 mg by mouth daily. 09/30/14   Historical Provider, MD  calcium carbonate (OS-CAL) 600 MG TABS Take 600 mg by mouth 2 (two) times daily with a meal.      Historical Provider, MD  cyclobenzaprine (FLEXERIL) 5 MG tablet Take 1 tablet (5 mg total) by mouth 2 (two) times daily as needed for muscle spasms. Patient not taking: Reported on 10/06/2014 10/18/12   Nehemiah Settle  Upstill, PA-C  diazepam (VALIUM) 5 MG tablet Take 5 mg by mouth every 6 (six) hours as needed. For anxiety    Historical Provider, MD  donepezil (ARICEPT) 10 MG tablet Take 10 mg by mouth daily. 09/25/14   Historical Provider, MD  HYDROcodone-acetaminophen (NORCO/VICODIN) 5-325 MG per tablet Take 1-2 tablets by mouth at bedtime as needed for pain. Patient not taking: Reported on 10/06/2014 12/31/12   Gregor Hams, MD  ibuprofen (ADVIL,MOTRIN) 600 MG tablet Take 1 tablet (600 mg total) by mouth every 6 (six) hours as needed for pain. Patient not taking: Reported on 10/06/2014 10/18/12   Charlann Lange, PA-C  Misc Natural Products (OSTEO BI-FLEX ADV JOINT SHIELD PO) Take 1 tablet by mouth daily.      Historical  Provider, MD  Multiple Vitamin (MULITIVITAMIN WITH MINERALS) TABS Take 1 tablet by mouth daily.    Historical Provider, MD  naproxen sodium (ANAPROX) 220 MG tablet Take 440 mg by mouth 2 (two) times daily as needed (for pain).    Historical Provider, MD  Omega-3 Fatty Acids (FISH OIL) 1000 MG CAPS Take 1 capsule by mouth daily.      Historical Provider, MD  oxybutynin (DITROPAN) 5 MG tablet Take 5 mg by mouth 2 (two) times daily. 09/08/14   Historical Provider, MD  sertraline (ZOLOFT) 50 MG tablet Take 50 mg by mouth daily.      Historical Provider, MD  traMADol (ULTRAM) 50 MG tablet Take 1 tablet (50 mg total) by mouth every 6 (six) hours as needed for pain. Patient not taking: Reported on 10/06/2014 12/27/12   Gregor Hams, MD  venlafaxine [facility]) 37.5 MG tablet Take 37.5 mg by mouth 2 (two) times daily. 09/05/14   Historical Provider, MD  vitamin C (ASCORBIC ACID) 500 MG tablet Take 500 mg by mouth daily.      Historical Provider, MD  vitamin E 200 UNIT capsule Take 200 Units by mouth 3 (three) times daily.      Historical Provider, MD   BP 172/64 mmHg  Pulse 67  Temp(Src) 97.9 F (36.6 C) (Oral)  Resp 17  Ht 5\' 4"  (1.626 m)  Wt 135 lb (61.236 kg)  BMI 23.16 kg/m2  SpO2 100% Physical Exam  Constitutional: She is oriented to person, place, and time. She appears well-developed and well-nourished.  HENT:  Head: Normocephalic. Head is with contusion.    Right Ear: External ear normal.  Left Ear: External ear normal.  Nose: Nose normal.  Eyes: Conjunctivae and EOM are normal. Pupils are equal, round, and reactive to light. Right eye exhibits no discharge. Left eye exhibits no discharge. Right conjunctiva is not injected. Right conjunctiva has no hemorrhage. Left conjunctiva is not injected. Left conjunctiva has no hemorrhage.  Neck: Neck supple.  Cardiovascular: Normal rate, regular rhythm and normal heart sounds.   Pulmonary/Chest: Effort normal and breath sounds normal.  Abdominal:  She exhibits no distension.  Neurological: She is alert and oriented to person, place, and time.  CN 3-12 grossly intact. 5/5 strength in all 4 extremities. Grossly normal sensation. Normal finger to nose  Skin: Skin is warm and dry.  Small skin tears to both upper arms. ~1 cm in diameter. Do not appear infected  Nursing note and vitals reviewed.   ED Course  Procedures (including critical care time) Labs Review Labs Reviewed - No data to display  Imaging Review Ct Head Wo Contrast  08/09/2015  CLINICAL DATA:  Post fall last Thursday, headache, right eye bruising  EXAM: CT HEAD WITHOUT CONTRAST CT MAXILLOFACIAL WITHOUT CONTRAST TECHNIQUE: Multidetector CT imaging of the head and maxillofacial structures were performed using the standard protocol without intravenous contrast. Multiplanar CT image reconstructions of the maxillofacial structures were also generated. COMPARISON:  07/07/2015 FINDINGS: CT HEAD FINDINGS No skull fracture is noted. Paranasal sinuses and mastoid air cells are unremarkable. No intracranial hemorrhage, mass effect or midline shift. Stable atrophy and chronic white matter disease. Please note high convexity axial vertex images are not included on the scan. This is visualized on coronal images of the face. There is a small scalp hematoma in right/front parietal region anteriorly measures 1.1 cm. Please see coronal image 22. No acute cortical infarction. No mass lesion is noted on this unenhanced scan. Stable cerebral atrophy. Stable periventricular and patchy subcortical chronic white matter disease. But scalp swelling and subcutaneous stranding in right frontal region. CT MAXILLOFACIAL FINDINGS Axial images shows no facial fracture. No nasal bone fracture. No intraorbital hematoma. Coronal reconstructed images shows no paranasal sinuses air-fluid levels. No orbital rim or orbital floor fracture. No TMJ dislocation. There is no zygomatic fracture. Sagittal images shows patent  nasopharyngeal and oropharyngeal airway. Degenerative changes are noted C1-C2 articulation. There is disc space flattening at C3-C4 level. Disc space flattening with moderate anterior spurring at C4-C5 and C5-C6 level. The visualized upper cervical airway is patent. No mandibular fracture is noted. Bilateral eye globe is symmetrical in appearance. IMPRESSION: 1. No acute intracranial abnormality. Stable atrophy and chronic white matter disease. 2. There is partially visualized scalp hematoma in right frontal/parietal region please see coronal image 22. Measures 1.1 cm. Mild scalp swelling and subcutaneous stranding in right frontal region. 3. No facial fracture or fluid collection. No intraorbital hematoma. 4. Patent nasopharyngeal and oropharyngeal airway. No zygomatic fracture. No mandibular fracture. 5. Degenerative changes cervical spine. Electronically Signed   By: Lahoma Crocker M.D.   On: 08/09/2015 10:38   Ct Maxillofacial Wo Cm  08/09/2015  CLINICAL DATA:  Post fall last Thursday, headache, right eye bruising EXAM: CT HEAD WITHOUT CONTRAST CT MAXILLOFACIAL WITHOUT CONTRAST TECHNIQUE: Multidetector CT imaging of the head and maxillofacial structures were performed using the standard protocol without intravenous contrast. Multiplanar CT image reconstructions of the maxillofacial structures were also generated. COMPARISON:  07/07/2015 FINDINGS: CT HEAD FINDINGS No skull fracture is noted. Paranasal sinuses and mastoid air cells are unremarkable. No intracranial hemorrhage, mass effect or midline shift. Stable atrophy and chronic white matter disease. Please note high convexity axial vertex images are not included on the scan. This is visualized on coronal images of the face. There is a small scalp hematoma in right/front parietal region anteriorly measures 1.1 cm. Please see coronal image 22. No acute cortical infarction. No mass lesion is noted on this unenhanced scan. Stable cerebral atrophy. Stable  periventricular and patchy subcortical chronic white matter disease. But scalp swelling and subcutaneous stranding in right frontal region. CT MAXILLOFACIAL FINDINGS Axial images shows no facial fracture. No nasal bone fracture. No intraorbital hematoma. Coronal reconstructed images shows no paranasal sinuses air-fluid levels. No orbital rim or orbital floor fracture. No TMJ dislocation. There is no zygomatic fracture. Sagittal images shows patent nasopharyngeal and oropharyngeal airway. Degenerative changes are noted C1-C2 articulation. There is disc space flattening at C3-C4 level. Disc space flattening with moderate anterior spurring at C4-C5 and C5-C6 level. The visualized upper cervical airway is patent. No mandibular fracture is noted. Bilateral eye globe is symmetrical in appearance. IMPRESSION: 1. No acute intracranial abnormality. Stable atrophy and  chronic white matter disease. 2. There is partially visualized scalp hematoma in right frontal/parietal region please see coronal image 22. Measures 1.1 cm. Mild scalp swelling and subcutaneous stranding in right frontal region. 3. No facial fracture or fluid collection. No intraorbital hematoma. 4. Patent nasopharyngeal and oropharyngeal airway. No zygomatic fracture. No mandibular fracture. 5. Degenerative changes cervical spine. Electronically Signed   By: Lahoma Crocker M.D.   On: 08/09/2015 10:38   I have personally reviewed and evaluated these images and lab results as part of my medical decision-making.   EKG Interpretation None      MDM   Final diagnoses:  Fall, initial encounter  Periorbital ecchymosis, right, initial encounter    Patient is overall well-appearing besides the ecchymosis to her right lateral face and periorbital eye. Patient has no focal tenderness. Given her intermittent nausea since the fall a CT was obtained and shows no acute intracranial abnormality. No facial fractures. Patient currently has no pain and no current  nausea. Will discharge with anti-emetic for as needed at home, but at this point I have very low suspicion of acute emergent pathology. Follow-up with PCP. Discussed return precautions.    Sherwood Gambler, MD 08/09/15 1114

## 2015-08-09 NOTE — ED Notes (Signed)
Pt had a mechanical fall on Friday striking the right side of her face. Pt alert x4. PT denies blood thinners.

## 2015-08-18 DIAGNOSIS — I10 Essential (primary) hypertension: Secondary | ICD-10-CM | POA: Diagnosis not present

## 2015-08-18 DIAGNOSIS — N390 Urinary tract infection, site not specified: Secondary | ICD-10-CM | POA: Diagnosis not present

## 2015-08-18 DIAGNOSIS — R739 Hyperglycemia, unspecified: Secondary | ICD-10-CM | POA: Diagnosis not present

## 2015-08-18 DIAGNOSIS — Z780 Asymptomatic menopausal state: Secondary | ICD-10-CM | POA: Diagnosis not present

## 2015-08-18 DIAGNOSIS — E782 Mixed hyperlipidemia: Secondary | ICD-10-CM | POA: Diagnosis not present

## 2015-08-25 DIAGNOSIS — I10 Essential (primary) hypertension: Secondary | ICD-10-CM | POA: Diagnosis not present

## 2015-08-25 DIAGNOSIS — R739 Hyperglycemia, unspecified: Secondary | ICD-10-CM | POA: Diagnosis not present

## 2015-08-25 DIAGNOSIS — F0390 Unspecified dementia without behavioral disturbance: Secondary | ICD-10-CM | POA: Diagnosis not present

## 2015-08-25 DIAGNOSIS — E782 Mixed hyperlipidemia: Secondary | ICD-10-CM | POA: Diagnosis not present

## 2015-10-11 DIAGNOSIS — Z961 Presence of intraocular lens: Secondary | ICD-10-CM | POA: Diagnosis not present

## 2015-10-11 DIAGNOSIS — H348322 Tributary (branch) retinal vein occlusion, left eye, stable: Secondary | ICD-10-CM | POA: Diagnosis not present

## 2015-10-11 DIAGNOSIS — H524 Presbyopia: Secondary | ICD-10-CM | POA: Diagnosis not present

## 2015-10-27 DIAGNOSIS — I10 Essential (primary) hypertension: Secondary | ICD-10-CM | POA: Diagnosis not present

## 2015-10-27 DIAGNOSIS — E782 Mixed hyperlipidemia: Secondary | ICD-10-CM | POA: Diagnosis not present

## 2015-11-02 DIAGNOSIS — H35033 Hypertensive retinopathy, bilateral: Secondary | ICD-10-CM | POA: Diagnosis not present

## 2015-11-02 DIAGNOSIS — H348322 Tributary (branch) retinal vein occlusion, left eye, stable: Secondary | ICD-10-CM | POA: Diagnosis not present

## 2015-11-02 DIAGNOSIS — H2513 Age-related nuclear cataract, bilateral: Secondary | ICD-10-CM | POA: Diagnosis not present

## 2015-11-03 DIAGNOSIS — E782 Mixed hyperlipidemia: Secondary | ICD-10-CM | POA: Diagnosis not present

## 2015-11-03 DIAGNOSIS — I10 Essential (primary) hypertension: Secondary | ICD-10-CM | POA: Diagnosis not present

## 2015-11-16 ENCOUNTER — Other Ambulatory Visit: Payer: Self-pay

## 2015-12-23 ENCOUNTER — Other Ambulatory Visit: Payer: Self-pay | Admitting: Orthopedic Surgery

## 2015-12-23 DIAGNOSIS — Z23 Encounter for immunization: Secondary | ICD-10-CM | POA: Diagnosis not present

## 2015-12-23 DIAGNOSIS — M5442 Lumbago with sciatica, left side: Secondary | ICD-10-CM | POA: Diagnosis not present

## 2015-12-23 DIAGNOSIS — M1712 Unilateral primary osteoarthritis, left knee: Secondary | ICD-10-CM

## 2016-01-10 ENCOUNTER — Ambulatory Visit
Admission: RE | Admit: 2016-01-10 | Discharge: 2016-01-10 | Disposition: A | Discharge status: Home / Self Care | Payer: Medicare Other | Source: Ambulatory Visit | Attending: Orthopedic Surgery | Admitting: Orthopedic Surgery

## 2016-01-10 DIAGNOSIS — M48061 Spinal stenosis, lumbar region without neurogenic claudication: Secondary | ICD-10-CM | POA: Diagnosis not present

## 2016-01-10 DIAGNOSIS — M1712 Unilateral primary osteoarthritis, left knee: Secondary | ICD-10-CM

## 2016-01-10 MED ORDER — IOPAMIDOL (ISOVUE-M 200) INJECTION 41%
15.0000 mL | Freq: Once | INTRAMUSCULAR | Status: AC
  Administered 2016-01-10: 15 mL via INTRATHECAL

## 2016-01-10 NOTE — Discharge Instructions (Signed)
Myelogram Discharge Instructions  1. Go home and rest quietly for the next 24 hours.  It is important to lie flat for the next 24 hours.  Get up only to go to the restroom.  You may lie in the bed or on a couch on your back, your stomach, your left side or your right side.  You may have one pillow under your head.  You may have pillows between your knees while you are on your side or under your knees while you are on your back.  2. DO NOT drive today.  Recline the seat as far back as it will go, while still wearing your seat belt, on the way home.  3. You may get up to go to the bathroom as needed.  You may sit up for 10 minutes to eat.  You may resume your normal diet and medications unless otherwise indicated.  Drink lots of extra fluids today and tomorrow.  4. The incidence of headache, nausea, or vomiting is about 5% (one in 20 patients).  If you develop a headache, lie flat and drink plenty of fluids until the headache goes away.  Caffeinated beverages may be helpful.  If you develop severe nausea and vomiting or a headache that does not go away with flat bed rest, call [phone].  5. You may resume normal activities after your 24 hours of bed rest is over; however, do not exert yourself strongly or do any heavy lifting tomorrow. If when you get up you have a headache when standing, go back to bed and force fluids for another 24 hours.  6. Call your physician for a follow-up appointment.  The results of your myelogram will be sent directly to your physician by the following day.  7. If you have any questions or if complications develop after you arrive home, please call [phone].  Discharge instructions have been explained to the patient.  The patient, or the person responsible for the patient, fully understands these instructions.        MAY RESUME EFFEXOR ON OCT. 24, 2017, AFTER 1:00 PM.

## 2016-01-10 NOTE — Progress Notes (Signed)
Patient states she has been off Effexor for the past two days.  Brita Romp, RN

## 2016-01-13 ENCOUNTER — Telehealth: Payer: Self-pay

## 2016-01-13 NOTE — Telephone Encounter (Signed)
LMOM asking patient how she is dong after her myelogram here 01/10/16.  jkl

## 2016-01-17 DIAGNOSIS — M5442 Lumbago with sciatica, left side: Secondary | ICD-10-CM | POA: Diagnosis not present

## 2016-02-03 DIAGNOSIS — E782 Mixed hyperlipidemia: Secondary | ICD-10-CM | POA: Diagnosis not present

## 2016-02-03 DIAGNOSIS — I10 Essential (primary) hypertension: Secondary | ICD-10-CM | POA: Diagnosis not present

## 2016-02-03 DIAGNOSIS — R9431 Abnormal electrocardiogram [ECG] [EKG]: Secondary | ICD-10-CM | POA: Diagnosis not present

## 2016-02-03 DIAGNOSIS — Z01818 Encounter for other preprocedural examination: Secondary | ICD-10-CM | POA: Diagnosis not present

## 2016-02-04 DIAGNOSIS — Z1231 Encounter for screening mammogram for malignant neoplasm of breast: Secondary | ICD-10-CM | POA: Diagnosis not present

## 2016-02-14 DIAGNOSIS — E7800 Pure hypercholesterolemia, unspecified: Secondary | ICD-10-CM | POA: Diagnosis not present

## 2016-02-23 DIAGNOSIS — Z01810 Encounter for preprocedural cardiovascular examination: Secondary | ICD-10-CM | POA: Diagnosis not present

## 2016-02-23 DIAGNOSIS — E7800 Pure hypercholesterolemia, unspecified: Secondary | ICD-10-CM | POA: Diagnosis not present

## 2016-02-23 DIAGNOSIS — I10 Essential (primary) hypertension: Secondary | ICD-10-CM | POA: Diagnosis not present

## 2016-02-24 DIAGNOSIS — R05 Cough: Secondary | ICD-10-CM | POA: Diagnosis not present

## 2016-02-24 DIAGNOSIS — N390 Urinary tract infection, site not specified: Secondary | ICD-10-CM | POA: Diagnosis not present

## 2016-02-24 DIAGNOSIS — J069 Acute upper respiratory infection, unspecified: Secondary | ICD-10-CM | POA: Diagnosis not present

## 2016-02-24 DIAGNOSIS — R300 Dysuria: Secondary | ICD-10-CM | POA: Diagnosis not present

## 2016-02-25 ENCOUNTER — Other Ambulatory Visit: Payer: Self-pay | Admitting: Internal Medicine

## 2016-02-25 DIAGNOSIS — R911 Solitary pulmonary nodule: Secondary | ICD-10-CM

## 2016-03-03 ENCOUNTER — Ambulatory Visit
Admission: RE | Admit: 2016-03-03 | Discharge: 2016-03-03 | Disposition: A | Discharge status: Home / Self Care | Payer: Medicare Other | Source: Ambulatory Visit | Attending: Internal Medicine | Admitting: Internal Medicine

## 2016-03-03 DIAGNOSIS — R911 Solitary pulmonary nodule: Secondary | ICD-10-CM

## 2016-03-03 MED ORDER — IOPAMIDOL (ISOVUE-300) INJECTION 61%
75.0000 mL | Freq: Once | INTRAVENOUS | Status: AC | PRN
  Administered 2016-03-03: 75 mL via INTRAVENOUS

## 2016-03-06 DIAGNOSIS — Z01810 Encounter for preprocedural cardiovascular examination: Secondary | ICD-10-CM | POA: Diagnosis not present

## 2016-03-06 DIAGNOSIS — I10 Essential (primary) hypertension: Secondary | ICD-10-CM | POA: Diagnosis not present

## 2016-03-06 DIAGNOSIS — R0602 Shortness of breath: Secondary | ICD-10-CM | POA: Diagnosis not present

## 2016-03-06 DIAGNOSIS — R9431 Abnormal electrocardiogram [ECG] [EKG]: Secondary | ICD-10-CM | POA: Diagnosis not present

## 2016-03-17 DIAGNOSIS — Z01810 Encounter for preprocedural cardiovascular examination: Secondary | ICD-10-CM | POA: Diagnosis not present

## 2016-03-17 DIAGNOSIS — I10 Essential (primary) hypertension: Secondary | ICD-10-CM | POA: Diagnosis not present

## 2016-03-17 DIAGNOSIS — E7800 Pure hypercholesterolemia, unspecified: Secondary | ICD-10-CM | POA: Diagnosis not present

## 2016-03-21 DIAGNOSIS — H348322 Tributary (branch) retinal vein occlusion, left eye, stable: Secondary | ICD-10-CM | POA: Diagnosis not present

## 2016-03-21 DIAGNOSIS — H2513 Age-related nuclear cataract, bilateral: Secondary | ICD-10-CM | POA: Diagnosis not present

## 2016-03-21 DIAGNOSIS — H35033 Hypertensive retinopathy, bilateral: Secondary | ICD-10-CM | POA: Diagnosis not present

## 2016-04-18 DIAGNOSIS — G8929 Other chronic pain: Secondary | ICD-10-CM | POA: Diagnosis not present

## 2016-04-18 DIAGNOSIS — M5442 Lumbago with sciatica, left side: Secondary | ICD-10-CM | POA: Diagnosis not present

## 2016-04-18 DIAGNOSIS — M5441 Lumbago with sciatica, right side: Secondary | ICD-10-CM | POA: Diagnosis not present

## 2016-07-10 ENCOUNTER — Other Ambulatory Visit: Payer: Self-pay | Admitting: Orthopedic Surgery

## 2016-07-14 NOTE — Patient Instructions (Addendum)
Shannon Blair  07/14/2016   Your procedure is scheduled on: 07-26-16  Report to [facility] Main Entrance Take [place]  Elevators to 3rd floor to [place] at 0900 AM.   Call this number if you have problems the morning of surgery [phone]    Remember: ONLY 1 PERSON MAY GO WITH YOU TO SHORT STAY TO GET  READY MORNING OF [place].  Do not eat food or drink liquids :After Midnight.     Take these medicines the morning of surgery with A SIP OF WATER:  Venalafaxine (Effexor), and Memantine (Namenda)                                You may not have any metal on your body including hair pins and              piercings  Do not wear jewelry, make-up, lotions, powders or perfumes, deodorant             Do not wear nail polish.  Do not shave  48 hours prior to surgery.                Do not bring valuables to the hospital. [place].  Contacts, dentures or bridgework may not be worn into surgery.  Leave suitcase in the car. After surgery it may be brought to your room.     Please read over the following fact sheets you were given: _____________________________________________________________________             [facility] - Preparing for Surgery Before surgery, you can play an important role.  Because skin is not sterile, your skin needs to be as free of germs as possible.  You can reduce the number of germs on your skin by washing with CHG (chlorahexidine gluconate) soap before surgery.  CHG is an antiseptic cleaner which kills germs and bonds with the skin to continue killing germs even after washing. Please DO NOT use if you have an allergy to CHG or antibacterial soaps.  If your skin becomes reddened/irritated stop using the CHG and inform your nurse when you arrive at Short Stay. Do not shave (including legs and underarms) for at least 48 hours prior to the first CHG shower.  You may shave your  face/neck. Please follow these instructions carefully:  1.  Shower with CHG Soap the night before surgery and the  morning of Surgery.  2.  If you choose to wash your hair, wash your hair first as usual with your  normal  shampoo.  3.  After you shampoo, rinse your hair and body thoroughly to remove the  shampoo.                           4.  Use CHG as you would any other liquid soap.  You can apply chg directly  to the skin and wash                       Gently with a scrungie or clean washcloth.  5.  Apply the CHG Soap to your body ONLY FROM THE NECK DOWN.   Do not use on  face/ open                           Wound or open sores. Avoid contact with eyes, ears mouth and genitals (private parts).                       Wash face,  Genitals (private parts) with your normal soap.             6.  Wash thoroughly, paying special attention to the area where your surgery  will be performed.  7.  Thoroughly rinse your body with warm water from the neck down.  8.  DO NOT shower/wash with your normal soap after using and rinsing off  the CHG Soap.                9.  Pat yourself dry with a clean towel.            10.  Wear clean pajamas.            11.  Place clean sheets on your bed the night of your first shower and do not  sleep with pets. Day of Surgery : Do not apply any lotions/deodorants the morning of surgery.  Please wear clean clothes to the hospital/surgery center.  FAILURE TO FOLLOW THESE INSTRUCTIONS MAY RESULT IN THE CANCELLATION OF YOUR SURGERY PATIENT SIGNATURE_________________________________  NURSE SIGNATURE__________________________________  ________________________________________________________________________   Adam Phenix  An incentive spirometer is a tool that can help keep your lungs clear and active. This tool measures how well you are filling your lungs with each breath. Taking long deep breaths may help reverse or decrease the chance of developing breathing  (pulmonary) problems (especially infection) following:  A long period of time when you are unable to move or be active. BEFORE THE PROCEDURE   If the spirometer includes an indicator to show your best effort, your nurse or respiratory therapist will set it to a desired goal.  If possible, sit up straight or lean slightly forward. Try not to slouch.  Hold the incentive spirometer in an upright position. INSTRUCTIONS FOR USE  1. Sit on the edge of your bed if possible, or sit up as far as you can in bed or on a chair. 2. Hold the incentive spirometer in an upright position. 3. Breathe out normally. 4. Place the mouthpiece in your mouth and seal your lips tightly around it. 5. Breathe in slowly and as deeply as possible, raising the piston or the ball toward the top of the column. 6. Hold your breath for 3-5 seconds or for as long as possible. Allow the piston or ball to fall to the bottom of the column. 7. Remove the mouthpiece from your mouth and breathe out normally. 8. Rest for a few seconds and repeat Steps 1 through 7 at least 10 times every 1-2 hours when you are awake. Take your time and take a few normal breaths between deep breaths. 9. The spirometer may include an indicator to show your best effort. Use the indicator as a goal to work toward during each repetition. 10. After each set of 10 deep breaths, practice coughing to be sure your lungs are clear. If you have an incision (the cut made at the time of surgery), support your incision when coughing by placing a pillow or rolled up towels firmly against it. Once you are able to get out of bed, walk around indoors  and cough well. You may stop using the incentive spirometer when instructed by your caregiver.  RISKS AND COMPLICATIONS  Take your time so you do not get dizzy or light-headed.  If you are in pain, you may need to take or ask for pain medication before doing incentive spirometry. It is harder to take a deep breath if you  are having pain. AFTER USE  Rest and breathe slowly and easily.  It can be helpful to keep track of a log of your progress. Your caregiver can provide you with a simple table to help with this. If you are using the spirometer at home, follow these instructions: [place] IF:   You are having difficultly using the spirometer.  You have trouble using the spirometer as often as instructed.  Your pain medication is not giving enough relief while using the spirometer.  You develop fever of 100.5 F (38.1 C) or higher. SEEK IMMEDIATE MEDICAL CARE IF:   You cough up bloody sputum that had not been present before.  You develop fever of 102 F (38.9 C) or greater.  You develop worsening pain at or near the incision site. MAKE SURE YOU:   Understand these instructions.  Will watch your condition.  Will get help right away if you are not doing well or get worse. Document Released: 07/17/2006 Document Revised: 05/29/2011 Document Reviewed: 09/17/2006 [facility] Patient Information 2014 [place], [place].   ________________________________________________________________________

## 2016-07-18 ENCOUNTER — Encounter (INDEPENDENT_AMBULATORY_CARE_PROVIDER_SITE_OTHER): Payer: Self-pay

## 2016-07-18 ENCOUNTER — Ambulatory Visit (HOSPITAL_COMMUNITY)
Admission: RE | Admit: 2016-07-18 | Discharge: 2016-07-18 | Disposition: A | Discharge status: Home / Self Care | Payer: Medicare HMO | Source: Ambulatory Visit | Attending: Surgical | Admitting: Surgical

## 2016-07-18 ENCOUNTER — Encounter (HOSPITAL_COMMUNITY): Payer: Self-pay

## 2016-07-18 ENCOUNTER — Encounter (HOSPITAL_COMMUNITY)
Admission: RE | Admit: 2016-07-18 | Discharge: 2016-07-18 | Disposition: A | Discharge status: Home / Self Care | Payer: Medicare HMO | Source: Ambulatory Visit | Attending: Orthopedic Surgery | Admitting: Orthopedic Surgery

## 2016-07-18 DIAGNOSIS — M4696 Unspecified inflammatory spondylopathy, lumbar region: Secondary | ICD-10-CM | POA: Diagnosis not present

## 2016-07-18 DIAGNOSIS — M47816 Spondylosis without myelopathy or radiculopathy, lumbar region: Secondary | ICD-10-CM | POA: Diagnosis not present

## 2016-07-18 DIAGNOSIS — M545 Low back pain, unspecified: Secondary | ICD-10-CM

## 2016-07-18 DIAGNOSIS — I700 Atherosclerosis of aorta: Secondary | ICD-10-CM | POA: Insufficient documentation

## 2016-07-18 DIAGNOSIS — M1612 Unilateral primary osteoarthritis, left hip: Secondary | ICD-10-CM | POA: Insufficient documentation

## 2016-07-18 DIAGNOSIS — M5126 Other intervertebral disc displacement, lumbar region: Secondary | ICD-10-CM | POA: Insufficient documentation

## 2016-07-18 DIAGNOSIS — Z01818 Encounter for other preprocedural examination: Secondary | ICD-10-CM

## 2016-07-18 DIAGNOSIS — M48061 Spinal stenosis, lumbar region without neurogenic claudication: Secondary | ICD-10-CM | POA: Insufficient documentation

## 2016-07-18 DIAGNOSIS — Z01812 Encounter for preprocedural laboratory examination: Secondary | ICD-10-CM | POA: Diagnosis not present

## 2016-07-18 DIAGNOSIS — Z01810 Encounter for preprocedural cardiovascular examination: Secondary | ICD-10-CM | POA: Diagnosis not present

## 2016-07-18 DIAGNOSIS — J984 Other disorders of lung: Secondary | ICD-10-CM | POA: Diagnosis not present

## 2016-07-18 LAB — CBC WITH DIFFERENTIAL/PLATELET
Basophils Absolute: 0 10*3/uL (ref 0.0–0.1)
Basophils Relative: 0 %
Eosinophils Absolute: 0.2 10*3/uL (ref 0.0–0.7)
Eosinophils Relative: 4 %
HCT: 39.7 % (ref 36.0–46.0)
Hemoglobin: 13.3 g/dL (ref 12.0–15.0)
Lymphocytes Relative: 18 %
Lymphs Abs: 1.1 10*3/uL (ref 0.7–4.0)
MCH: 28.9 pg (ref 26.0–34.0)
MCHC: 33.5 g/dL (ref 30.0–36.0)
MCV: 86.1 fL (ref 78.0–100.0)
Monocytes Absolute: 0.4 10*3/uL (ref 0.1–1.0)
Monocytes Relative: 6 %
Neutro Abs: 4.3 10*3/uL (ref 1.7–7.7)
Neutrophils Relative %: 72 %
Platelets: 188 10*3/uL (ref 150–400)
RBC: 4.61 MIL/uL (ref 3.87–5.11)
RDW: 13.8 % (ref 11.5–15.5)
WBC: 6 10*3/uL (ref 4.0–10.5)

## 2016-07-18 LAB — URINALYSIS, ROUTINE W REFLEX MICROSCOPIC
Bilirubin Urine: NEGATIVE
Glucose, UA: NEGATIVE mg/dL
Ketones, ur: NEGATIVE mg/dL
Nitrite: NEGATIVE
Protein, ur: NEGATIVE mg/dL
Specific Gravity, Urine: 1.013 (ref 1.005–1.030)
pH: 5 (ref 5.0–8.0)

## 2016-07-18 LAB — COMPREHENSIVE METABOLIC PANEL
ALT: 18 U/L (ref 14–54)
AST: 18 U/L (ref 15–41)
Albumin: 4 g/dL (ref 3.5–5.0)
Alkaline Phosphatase: 94 U/L (ref 38–126)
Anion gap: 7 (ref 5–15)
BUN: 12 mg/dL (ref 6–20)
CO2: 27 mmol/L (ref 22–32)
Calcium: 9.5 mg/dL (ref 8.9–10.3)
Chloride: 108 mmol/L (ref 101–111)
Creatinine, Ser: 0.85 mg/dL (ref 0.44–1.00)
GFR calc Af Amer: >60 mL/min (ref >60)
GFR calc non Af Amer: >60 mL/min (ref >60)
Glucose, Bld: 93 mg/dL (ref 65–99)
Potassium: 3.8 mmol/L (ref 3.5–5.1)
Sodium: 142 mmol/L (ref 135–145)
Total Bilirubin: 0.6 mg/dL (ref 0.3–1.2)
Total Protein: 6.7 g/dL (ref 6.5–8.1)

## 2016-07-18 LAB — APTT: aPTT: 28 seconds (ref 24–36)

## 2016-07-18 LAB — SURGICAL PCR SCREEN
MRSA, PCR: NEGATIVE
Staphylococcus aureus: NEGATIVE

## 2016-07-18 LAB — PROTIME-INR
INR: 1.02
Prothrombin Time: 13.4 seconds (ref 11.4–15.2)

## 2016-07-19 NOTE — Progress Notes (Signed)
07-18-16 UA results sent to Dr. Gladstone Lighter for review.

## 2016-07-22 ENCOUNTER — Ambulatory Visit (INDEPENDENT_AMBULATORY_CARE_PROVIDER_SITE_OTHER): Payer: Medicare HMO

## 2016-07-22 ENCOUNTER — Ambulatory Visit (HOSPITAL_COMMUNITY)
Admission: EM | Admit: 2016-07-22 | Discharge: 2016-07-22 | Disposition: A | Discharge status: Home / Self Care | Payer: Medicare HMO | Attending: Internal Medicine | Admitting: Internal Medicine

## 2016-07-22 ENCOUNTER — Encounter (HOSPITAL_COMMUNITY): Payer: Self-pay | Admitting: Emergency Medicine

## 2016-07-22 DIAGNOSIS — T148XXA Other injury of unspecified body region, initial encounter: Secondary | ICD-10-CM

## 2016-07-22 DIAGNOSIS — S52135A Nondisplaced fracture of neck of left radius, initial encounter for closed fracture: Secondary | ICD-10-CM | POA: Diagnosis not present

## 2016-07-22 DIAGNOSIS — Z23 Encounter for immunization: Secondary | ICD-10-CM

## 2016-07-22 DIAGNOSIS — S50312A Abrasion of left elbow, initial encounter: Secondary | ICD-10-CM | POA: Diagnosis not present

## 2016-07-22 DIAGNOSIS — W19XXXA Unspecified fall, initial encounter: Secondary | ICD-10-CM | POA: Diagnosis not present

## 2016-07-22 MED ORDER — TETANUS-DIPHTH-ACELL PERTUSSIS 5-2.5-18.5 LF-MCG/0.5 IM SUSP
INTRAMUSCULAR | Status: AC
  Filled 2016-07-22: qty 0.5

## 2016-07-22 MED ORDER — TETANUS-DIPHTH-ACELL PERTUSSIS 5-2.5-18.5 LF-MCG/0.5 IM SUSP
0.5000 mL | Freq: Once | INTRAMUSCULAR | Status: AC
  Administered 2016-07-22: 0.5 mL via INTRAMUSCULAR

## 2016-07-22 NOTE — ED Provider Notes (Signed)
CSN: [id number]     Arrival date & time 07/22/16  67 History   First MD Initiated Contact with Patient 07/22/16 1927     Chief Complaint  Patient presents with  . Fall   (Consider location/radiation/quality/duration/timing/severity/associated sxs/prior Treatment) 83 year old female was exiting a car yesterday and she fell on the cement and she was going toward the mailbox. Her left elbow struck the cement and received a couple of abrasions. She continues to complain of pain in the elbow joint. She is able to extend and flex the elbow normally. Denies other injuries.      Past Medical History:  Diagnosis Date  . Anal cancer ([place]) 2004   Non-surgical mgt with chemo-radiation.  Dr Alen Blew follows.   . Anal fissure   . Colon, diverticulosis    Sigmoid. With associated stenosis.  Colonoscopy failed to intubate beyond stenosis in 08/2010.  rectal bx then negative for recurrent cancer.   . Hypertension   . OA (osteoarthritis)   . Squamous cell carcinoma    skin  . Varicose veins of left lower extremity with pain    Past Surgical History:  Procedure Laterality Date  . ABDOMINAL HYSTERECTOMY    . APPENDECTOMY    . BLADDER REPAIR    . CATARACT EXTRACTION  05/2004    dr Ishmael Holter  . NEPHRECTOMY  1970's   Rt. "Shrivelled up" ,  no cancer.   Marland Kitchen POLYPECTOMY     Family History  Problem Relation Age of Onset  . Coronary artery disease Father   . Leukemia Brother   . Colon cancer Brother   . Breast cancer Sister   . Ovarian cancer Sister   . Diabetes Daughter    Social History  Substance Use Topics  . Smoking status: Never Smoker  . Smokeless tobacco: Never Used  . Alcohol use No   OB History    No data available     Review of Systems  Constitutional: Negative.   HENT: Negative.   Respiratory: Negative for shortness of breath.   Cardiovascular: Negative for chest pain.  Skin: Positive for wound.  Neurological: Negative.   All other systems reviewed and are  negative.   Allergies  Patient has no known allergies.  Home Medications   Prior to Admission medications   Medication Sig Start Date End Date Taking? Authorizing Provider  acetaminophen (TYLENOL) 325 MG tablet Take 650 mg by mouth every 6 (six) hours as needed for mild pain.     [provider]  aspirin EC 81 MG tablet Take 81 mg by mouth daily.    [provider]  calcium carbonate (OS-CAL) 600 MG TABS Take 600 mg by mouth 2 (two) times daily with a meal.      [provider]  cephALEXin (KEFLEX) 500 MG capsule Take 500 mg by mouth 4 (four) times daily. 07/17/16   [provider]  donepezil (ARICEPT) 10 MG tablet Take 10 mg by mouth at bedtime.  09/25/14   [provider]  ibuprofen (ADVIL,MOTRIN) 600 MG tablet Take 1 tablet (600 mg total) by mouth every 6 (six) hours as needed for pain. Patient not taking: Reported on 12/24/2015 10/18/12   Charlann Lange, PA-C  irbesartan (AVAPRO) 300 MG tablet Take 300 mg by mouth daily.    [provider]  memantine (NAMENDA) 10 MG tablet Take 10 mg by mouth 2 (two) times daily. 04/20/16   [provider]  Misc Natural Products (OSTEO BI-FLEX ADV JOINT SHIELD PO) Take 1  tablet by mouth daily.      [provider]  Multiple Vitamin (MULITIVITAMIN WITH MINERALS) TABS Take 1 tablet by mouth daily.    [provider]  naproxen sodium (ANAPROX) 220 MG tablet Take 440 mg by mouth 2 (two) times daily as needed (for pain).    [provider]  Omega-3 Fatty Acids (FISH OIL) 1000 MG CAPS Take 1 capsule by mouth daily.      [provider]  oxybutynin (DITROPAN) 5 MG tablet Take 5 mg by mouth 2 (two) times daily. 09/08/14   [provider]  simvastatin (ZOCOR) 40 MG tablet Take 40 mg by mouth daily at 6 PM.     [provider]  venlafaxine (EFFEXOR) 37.5 MG tablet Take 37.5 mg by mouth 2 (two) times daily. 09/05/14   [provider]  vitamin C  (ASCORBIC ACID) 500 MG tablet Take 500 mg by mouth daily.      [provider]  vitamin E 200 UNIT capsule Take 200 Units by mouth 3 (three) times daily.      [provider]   Meds Ordered and Administered this Visit   Medications  Tdap (BOOSTRIX) injection 0.5 mL (not administered)    BP (!) 186/64 (BP Location: Right Arm)   Pulse 73   Temp 98.2 F (36.8 C) (Oral)   Resp 20   SpO2 98%  No data found.   Physical Exam  Constitutional: She appears well-developed and well-nourished. No distress.  Eyes: EOM are normal.  Neck: Normal range of motion. Neck supple.  Cardiovascular: Normal rate.   Pulmonary/Chest: Effort normal.  Musculoskeletal: She exhibits no deformity.  Left elbow without swelling. No deformity. Positive for tenderness over the proximal radius. Flexion and extension, supination and pronation intact.  Neurological: She is alert. Coordination normal.  Skin: Skin is warm and dry.  Psychiatric: She has a normal mood and affect. Her behavior is normal.  Nursing note and vitals reviewed.   Urgent Care Course     Procedures (including critical care time)  Labs Review Labs Reviewed - No data to display  Imaging Review Dg Elbow Complete Left  Result Date: 07/22/2016 CLINICAL DATA:  83 year old female with acute left elbow pain following fall yesterday. Initial encounter. EXAM: LEFT ELBOW - COMPLETE 3+ VIEW COMPARISON:  None. FINDINGS: A nondisplaced radial neck fracture is noted, age indeterminate but may be acute. No other fracture, subluxation or dislocation identified. No other bony abnormalities are noted. IMPRESSION: Nondisplaced radial neck fracture -age indeterminate but may be acute. Correlate with pain. Electronically Signed   By: Margarette Canada M.D.   On: 07/22/2016 18:53     Visual Acuity Review  Right Eye Distance:   Left Eye Distance:   Bilateral Distance:    Right Eye Near:   Left Eye Near:    Bilateral Near:         MDM    1. Fall, initial encounter   2. Abrasion   3. Closed nondisplaced fracture of neck of left radius, initial encounter    Keep the abrasions clean with mild soap and water daily. Watch for any signs of infection. Wear the arm sling as directed by the orthopedist. Call your orthopedist on Monday for an appointment to evaluate the elbow bone. Clean abrasions, apply nonstick dressing,   T dap Left arm sling.   Janne Napoleon, NP 07/22/16 1946

## 2016-07-22 NOTE — Discharge Instructions (Signed)
Keep the abrasions clean with mild soap and water daily. Watch for any signs of infection. Wear the arm sling as directed by the orthopedist. Call your orthopedist on Monday for an appointment to evaluate the elbow bone.

## 2016-07-22 NOTE — ED Triage Notes (Addendum)
[place] getting out of a car yesterday and landed on the Customer service manager.  Left elbow pain and multiple abrasions.  Left radial pulse 2+.  Able to move hand.  Brisk cap refill  Patient is supposed to have surgery on back this week.  Dr Gladstone Lighter is the Psychologist, sport and exercise.

## 2016-07-24 DIAGNOSIS — S52135A Nondisplaced fracture of neck of left radius, initial encounter for closed fracture: Secondary | ICD-10-CM | POA: Diagnosis not present

## 2016-07-26 ENCOUNTER — Observation Stay (HOSPITAL_COMMUNITY)
Admission: RE | Admit: 2016-07-26 | Discharge: 2016-07-27 | Disposition: A | Discharge status: Home / Self Care | Payer: Medicare HMO | Source: Ambulatory Visit | Attending: Orthopedic Surgery | Admitting: Orthopedic Surgery

## 2016-07-26 ENCOUNTER — Ambulatory Visit (HOSPITAL_COMMUNITY): Payer: Medicare HMO | Admitting: Registered Nurse

## 2016-07-26 ENCOUNTER — Ambulatory Visit (HOSPITAL_COMMUNITY): Payer: Medicare HMO

## 2016-07-26 ENCOUNTER — Encounter (HOSPITAL_COMMUNITY)
Admission: RE | Disposition: A | Discharge status: Home / Self Care | Payer: Self-pay | Source: Ambulatory Visit | Attending: Orthopedic Surgery

## 2016-07-26 ENCOUNTER — Encounter (HOSPITAL_COMMUNITY): Payer: Self-pay | Admitting: *Deleted

## 2016-07-26 DIAGNOSIS — Z85828 Personal history of other malignant neoplasm of skin: Secondary | ICD-10-CM | POA: Diagnosis not present

## 2016-07-26 DIAGNOSIS — Z79899 Other long term (current) drug therapy: Secondary | ICD-10-CM | POA: Insufficient documentation

## 2016-07-26 DIAGNOSIS — M5136 Other intervertebral disc degeneration, lumbar region: Secondary | ICD-10-CM | POA: Diagnosis not present

## 2016-07-26 DIAGNOSIS — M549 Dorsalgia, unspecified: Secondary | ICD-10-CM

## 2016-07-26 DIAGNOSIS — Z923 Personal history of irradiation: Secondary | ICD-10-CM | POA: Diagnosis not present

## 2016-07-26 DIAGNOSIS — M4807 Spinal stenosis, lumbosacral region: Secondary | ICD-10-CM | POA: Diagnosis not present

## 2016-07-26 DIAGNOSIS — Z9221 Personal history of antineoplastic chemotherapy: Secondary | ICD-10-CM | POA: Insufficient documentation

## 2016-07-26 DIAGNOSIS — Z85048 Personal history of other malignant neoplasm of rectum, rectosigmoid junction, and anus: Secondary | ICD-10-CM | POA: Insufficient documentation

## 2016-07-26 DIAGNOSIS — Z9889 Other specified postprocedural states: Secondary | ICD-10-CM | POA: Diagnosis not present

## 2016-07-26 DIAGNOSIS — Z905 Acquired absence of kidney: Secondary | ICD-10-CM | POA: Insufficient documentation

## 2016-07-26 DIAGNOSIS — I10 Essential (primary) hypertension: Secondary | ICD-10-CM | POA: Insufficient documentation

## 2016-07-26 DIAGNOSIS — Z7982 Long term (current) use of aspirin: Secondary | ICD-10-CM | POA: Diagnosis not present

## 2016-07-26 DIAGNOSIS — M48062 Spinal stenosis, lumbar region with neurogenic claudication: Secondary | ICD-10-CM | POA: Diagnosis not present

## 2016-07-26 DIAGNOSIS — Z419 Encounter for procedure for purposes other than remedying health state, unspecified: Secondary | ICD-10-CM

## 2016-07-26 HISTORY — PX: LUMBAR LAMINECTOMY/DECOMPRESSION MICRODISCECTOMY: SHX5026

## 2016-07-26 SURGERY — LUMBAR LAMINECTOMY/DECOMPRESSION MICRODISCECTOMY
Anesthesia: General

## 2016-07-26 MED ORDER — ACETAMINOPHEN 325 MG PO TABS: 650.0000 mg | ORAL_TABLET | ORAL | Status: DC | PRN

## 2016-07-26 MED ORDER — SODIUM CHLORIDE 0.9 % IR SOLN
Status: DC | PRN
  Administered 2016-07-26: 500 mL

## 2016-07-26 MED ORDER — HYDROMORPHONE HCL 1 MG/ML IJ SOLN: 0.5000 mg | INTRAMUSCULAR | Status: DC | PRN

## 2016-07-26 MED ORDER — BUPIVACAINE LIPOSOME 1.3 % IJ SUSP
INTRAMUSCULAR | Status: DC | PRN
  Administered 2016-07-26: 20 mL

## 2016-07-26 MED ORDER — BACITRACIN-NEOMYCIN-POLYMYXIN 400-5-5000 EX OINT
TOPICAL_OINTMENT | CUTANEOUS | Status: DC | PRN
  Administered 2016-07-26: 1 via TOPICAL

## 2016-07-26 MED ORDER — FENTANYL CITRATE (PF) 250 MCG/5ML IJ SOLN
INTRAMUSCULAR | Status: AC
  Filled 2016-07-26: qty 5

## 2016-07-26 MED ORDER — METHOCARBAMOL 500 MG PO TABS: 500.0000 mg | ORAL_TABLET | Freq: Four times a day (QID) | ORAL | Status: DC | PRN

## 2016-07-26 MED ORDER — METHOCARBAMOL 1000 MG/10ML IJ SOLN: 500.0000 mg | Freq: Four times a day (QID) | INTRAVENOUS | Status: DC | PRN

## 2016-07-26 MED ORDER — PHENOL 1.4 % MT LIQD: 1.0000 | OROMUCOSAL | Status: DC | PRN

## 2016-07-26 MED ORDER — EPHEDRINE 5 MG/ML INJ
INTRAVENOUS | Status: AC
  Filled 2016-07-26: qty 10

## 2016-07-26 MED ORDER — CHLORHEXIDINE GLUCONATE 4 % EX LIQD: 60.0000 mL | Freq: Once | CUTANEOUS | Status: DC

## 2016-07-26 MED ORDER — PROPOFOL 10 MG/ML IV BOLUS
INTRAVENOUS | Status: DC | PRN
  Administered 2016-07-26: 100 mg via INTRAVENOUS

## 2016-07-26 MED ORDER — SUGAMMADEX SODIUM 200 MG/2ML IV SOLN
INTRAVENOUS | Status: AC
  Filled 2016-07-26: qty 8

## 2016-07-26 MED ORDER — OXYBUTYNIN CHLORIDE 5 MG PO TABS
5.0000 mg | ORAL_TABLET | Freq: Two times a day (BID) | ORAL | Status: DC
  Administered 2016-07-26 – 2016-07-27 (×2): 5 mg via ORAL
  Filled 2016-07-26 (×2): qty 1

## 2016-07-26 MED ORDER — HYDROCODONE-ACETAMINOPHEN 5-325 MG PO TABS
1.0000 | ORAL_TABLET | ORAL | Status: DC | PRN
Start: 2016-07-26 — End: 2016-07-27
  Administered 2016-07-27 (×2): 1 via ORAL
  Filled 2016-07-26 (×2): qty 1

## 2016-07-26 MED ORDER — CEFAZOLIN SODIUM-DEXTROSE 1-4 GM/50ML-% IV SOLN
1.0000 g | Freq: Three times a day (TID) | INTRAVENOUS | Status: AC
  Administered 2016-07-26 – 2016-07-27 (×3): 1 g via INTRAVENOUS
  Filled 2016-07-26 (×3): qty 50

## 2016-07-26 MED ORDER — POLYETHYLENE GLYCOL 3350 17 G PO PACK: 17.0000 g | PACK | Freq: Every day | ORAL | Status: DC | PRN

## 2016-07-26 MED ORDER — PHENYLEPHRINE 40 MCG/ML (10ML) SYRINGE FOR IV PUSH (FOR BLOOD PRESSURE SUPPORT)
PREFILLED_SYRINGE | INTRAVENOUS | Status: DC | PRN
  Administered 2016-07-26 (×4): 80 ug via INTRAVENOUS

## 2016-07-26 MED ORDER — ALBUMIN HUMAN 5 % IV SOLN
INTRAVENOUS | Status: DC | PRN
  Administered 2016-07-26: 13:00:00 via INTRAVENOUS

## 2016-07-26 MED ORDER — ONDANSETRON HCL 4 MG PO TABS: 4.0000 mg | ORAL_TABLET | Freq: Four times a day (QID) | ORAL | Status: DC | PRN

## 2016-07-26 MED ORDER — THROMBIN 5000 UNITS EX SOLR
CUTANEOUS | Status: AC
  Filled 2016-07-26: qty 10000

## 2016-07-26 MED ORDER — LIDOCAINE 2% (20 MG/ML) 5 ML SYRINGE
INTRAMUSCULAR | Status: AC
  Filled 2016-07-26: qty 5

## 2016-07-26 MED ORDER — DEXAMETHASONE SODIUM PHOSPHATE 10 MG/ML IJ SOLN
INTRAMUSCULAR | Status: AC
  Filled 2016-07-26: qty 1

## 2016-07-26 MED ORDER — CEFAZOLIN SODIUM-DEXTROSE 2-4 GM/100ML-% IV SOLN
2.0000 g | INTRAVENOUS | Status: AC
  Administered 2016-07-26: 2 g via INTRAVENOUS
  Filled 2016-07-26: qty 100

## 2016-07-26 MED ORDER — ONDANSETRON HCL 4 MG/2ML IJ SOLN
INTRAMUSCULAR | Status: AC
  Filled 2016-07-26: qty 2

## 2016-07-26 MED ORDER — LIDOCAINE-EPINEPHRINE (PF) 1 %-1:200000 IJ SOLN
INTRAMUSCULAR | Status: AC
  Filled 2016-07-26: qty 30

## 2016-07-26 MED ORDER — EPHEDRINE SULFATE-NACL 50-0.9 MG/10ML-% IV SOSY
PREFILLED_SYRINGE | INTRAVENOUS | Status: DC | PRN
  Administered 2016-07-26: 15 mg via INTRAVENOUS

## 2016-07-26 MED ORDER — ROCURONIUM BROMIDE 50 MG/5ML IV SOSY
PREFILLED_SYRINGE | INTRAVENOUS | Status: AC
  Filled 2016-07-26: qty 10

## 2016-07-26 MED ORDER — PHENYLEPHRINE 40 MCG/ML (10ML) SYRINGE FOR IV PUSH (FOR BLOOD PRESSURE SUPPORT)
PREFILLED_SYRINGE | INTRAVENOUS | Status: AC
  Filled 2016-07-26: qty 10

## 2016-07-26 MED ORDER — FLEET ENEMA 7-19 GM/118ML RE ENEM: 1.0000 | ENEMA | Freq: Once | RECTAL | Status: DC | PRN

## 2016-07-26 MED ORDER — ROCURONIUM BROMIDE 10 MG/ML (PF) SYRINGE
PREFILLED_SYRINGE | INTRAVENOUS | Status: DC | PRN
  Administered 2016-07-26: 40 mg via INTRAVENOUS
  Administered 2016-07-26: 10 mg via INTRAVENOUS
  Administered 2016-07-26: 20 mg via INTRAVENOUS

## 2016-07-26 MED ORDER — ACETAMINOPHEN 650 MG RE SUPP
650.0000 mg | RECTAL | Status: DC | PRN
Start: 2016-07-26 — End: 2016-07-27

## 2016-07-26 MED ORDER — LIDOCAINE 2% (20 MG/ML) 5 ML SYRINGE
INTRAMUSCULAR | Status: DC | PRN
  Administered 2016-07-26: 75 mg via INTRAVENOUS

## 2016-07-26 MED ORDER — FENTANYL CITRATE (PF) 250 MCG/5ML IJ SOLN
INTRAMUSCULAR | Status: DC | PRN
  Administered 2016-07-26 (×3): 50 ug via INTRAVENOUS

## 2016-07-26 MED ORDER — LIDOCAINE-EPINEPHRINE 1 %-1:100000 IJ SOLN
INTRAMUSCULAR | Status: DC | PRN
  Administered 2016-07-26: 20 mL

## 2016-07-26 MED ORDER — OXYCODONE-ACETAMINOPHEN 5-325 MG PO TABS: 2.0000 | ORAL_TABLET | ORAL | Status: DC | PRN

## 2016-07-26 MED ORDER — BISACODYL 5 MG PO TBEC: 5.0000 mg | DELAYED_RELEASE_TABLET | Freq: Every day | ORAL | Status: DC | PRN

## 2016-07-26 MED ORDER — DONEPEZIL HCL 10 MG PO TABS
10.0000 mg | ORAL_TABLET | Freq: Every day | ORAL | Status: DC
  Administered 2016-07-26: 21:00:00 10 mg via ORAL
  Filled 2016-07-26: qty 1

## 2016-07-26 MED ORDER — BUPIVACAINE LIPOSOME 1.3 % IJ SUSP
20.0000 mL | Freq: Once | INTRAMUSCULAR | Status: DC
  Filled 2016-07-26: qty 20

## 2016-07-26 MED ORDER — LACTATED RINGERS IV SOLN
INTRAVENOUS | Status: DC
  Administered 2016-07-26 (×2): via INTRAVENOUS

## 2016-07-26 MED ORDER — SODIUM CHLORIDE 0.9 % IR SOLN
Status: AC
  Filled 2016-07-26: qty 500000

## 2016-07-26 MED ORDER — ONDANSETRON HCL 4 MG/2ML IJ SOLN
INTRAMUSCULAR | Status: DC | PRN
  Administered 2016-07-26: 4 mg via INTRAVENOUS

## 2016-07-26 MED ORDER — ONDANSETRON HCL 4 MG/2ML IJ SOLN: 4.0000 mg | Freq: Four times a day (QID) | INTRAMUSCULAR | Status: DC | PRN

## 2016-07-26 MED ORDER — LACTATED RINGERS IV SOLN: INTRAVENOUS | Status: DC

## 2016-07-26 MED ORDER — BACITRACIN ZINC 500 UNIT/GM EX OINT
TOPICAL_OINTMENT | CUTANEOUS | Status: AC
  Filled 2016-07-26: qty 28.35

## 2016-07-26 MED ORDER — SUGAMMADEX SODIUM 200 MG/2ML IV SOLN
INTRAVENOUS | Status: DC | PRN
  Administered 2016-07-26: 200 mg via INTRAVENOUS

## 2016-07-26 MED ORDER — ALBUMIN HUMAN 5 % IV SOLN
INTRAVENOUS | Status: AC
Start: 2016-07-26 — End: 2016-07-26
  Filled 2016-07-26: qty 250

## 2016-07-26 MED ORDER — VENLAFAXINE HCL 37.5 MG PO TABS
37.5000 mg | ORAL_TABLET | Freq: Two times a day (BID) | ORAL | Status: DC
  Administered 2016-07-26 – 2016-07-27 (×2): 37.5 mg via ORAL
  Filled 2016-07-26 (×2): qty 1

## 2016-07-26 MED ORDER — MENTHOL 3 MG MT LOZG: 1.0000 | LOZENGE | OROMUCOSAL | Status: DC | PRN

## 2016-07-26 MED ORDER — IRBESARTAN 150 MG PO TABS
300.0000 mg | ORAL_TABLET | Freq: Every day | ORAL | Status: DC
  Administered 2016-07-27: 300 mg via ORAL
  Filled 2016-07-26: qty 2

## 2016-07-26 MED ORDER — FENTANYL CITRATE (PF) 100 MCG/2ML IJ SOLN: 25.0000 ug | INTRAMUSCULAR | Status: DC | PRN

## 2016-07-26 MED ORDER — DEXAMETHASONE SODIUM PHOSPHATE 10 MG/ML IJ SOLN
INTRAMUSCULAR | Status: DC | PRN
  Administered 2016-07-26: 10 mg via INTRAVENOUS

## 2016-07-26 MED ORDER — MEMANTINE HCL 10 MG PO TABS
10.0000 mg | ORAL_TABLET | Freq: Two times a day (BID) | ORAL | Status: DC
  Administered 2016-07-26 – 2016-07-27 (×2): 10 mg via ORAL
  Filled 2016-07-26 (×2): qty 1

## 2016-07-26 SURGICAL SUPPLY — 46 items
AGENT HMST SPONGE THK3/8 (HEMOSTASIS) ×1
APL SKNCLS STERI-STRIP NONHPOA (GAUZE/BANDAGES/DRESSINGS) ×1
BAG SPEC THK2 15X12 ZIP CLS (MISCELLANEOUS)
BAG ZIPLOCK 12X15 (MISCELLANEOUS) IMPLANT
BENZOIN TINCTURE PRP APPL 2/3 (GAUZE/BANDAGES/DRESSINGS) ×3 IMPLANT
CLEANER TIP ELECTROSURG 2X2 (MISCELLANEOUS) ×3 IMPLANT
COVER SURGICAL LIGHT HANDLE (MISCELLANEOUS) ×3 IMPLANT
DRAIN PENROSE 18X1/4 LTX STRL (WOUND CARE) IMPLANT
DRAPE MICROSCOPE LEICA (MISCELLANEOUS) ×3 IMPLANT
DRAPE POUCH INSTRU U-SHP 10X18 (DRAPES) ×3 IMPLANT
DRAPE SHEET LG 3/4 BI-LAMINATE (DRAPES) ×3 IMPLANT
DRAPE SURG 17X11 SM STRL (DRAPES) ×3 IMPLANT
DRSG ADAPTIC 3X8 NADH LF (GAUZE/BANDAGES/DRESSINGS) ×3 IMPLANT
DRSG PAD ABDOMINAL 8X10 ST (GAUZE/BANDAGES/DRESSINGS) ×12 IMPLANT
DURAPREP 26ML APPLICATOR (WOUND CARE) ×3 IMPLANT
ELECT BLADE TIP CTD 4 INCH (ELECTRODE) ×3 IMPLANT
ELECT REM PT RETURN 15FT ADLT (MISCELLANEOUS) ×3 IMPLANT
GAUZE SPONGE 4X4 12PLY STRL (GAUZE/BANDAGES/DRESSINGS) ×3 IMPLANT
GLOVE BIOGEL PI IND STRL 8 (GLOVE) ×1 IMPLANT
GLOVE BIOGEL PI INDICATOR 8 (GLOVE) ×2
GLOVE ECLIPSE 8.0 STRL XLNG CF (GLOVE) ×6 IMPLANT
GOWN STRL REUS W/TWL XL LVL3 (GOWN DISPOSABLE) ×6 IMPLANT
HEMOSTAT SPONGE AVITENE ULTRA (HEMOSTASIS) ×3 IMPLANT
KIT BASIN OR (CUSTOM PROCEDURE TRAY) ×3 IMPLANT
KIT POSITIONING SURG ANDREWS (MISCELLANEOUS) ×3 IMPLANT
MANIFOLD NEPTUNE II (INSTRUMENTS) ×3 IMPLANT
MARKER SKIN DUAL TIP RULER LAB (MISCELLANEOUS) ×3 IMPLANT
NDL SPNL 18GX3.5 QUINCKE PK (NEEDLE) ×2 IMPLANT
NEEDLE HYPO 22GX1.5 SAFETY (NEEDLE) ×6 IMPLANT
NEEDLE SPNL 18GX3.5 QUINCKE PK (NEEDLE) ×6 IMPLANT
PACK LAMINECTOMY ORTHO (CUSTOM PROCEDURE TRAY) ×3 IMPLANT
PAD ABD 8X10 STRL (GAUZE/BANDAGES/DRESSINGS) ×3 IMPLANT
PATTIES SURGICAL .5 X.5 (GAUZE/BANDAGES/DRESSINGS) IMPLANT
PATTIES SURGICAL .75X.75 (GAUZE/BANDAGES/DRESSINGS) ×3 IMPLANT
PATTIES SURGICAL 1X1 (DISPOSABLE) ×3 IMPLANT
PIN SAFETY NICK PLATE  2 MED (MISCELLANEOUS)
PIN SAFETY NICK PLATE 2 MED (MISCELLANEOUS) IMPLANT
SPONGE LAP 4X18 X RAY DECT (DISPOSABLE) ×6 IMPLANT
STAPLER VISISTAT 35W (STAPLE) ×3 IMPLANT
SUT VIC AB 0 CT1 27 (SUTURE) ×3
SUT VIC AB 0 CT1 27XBRD ANTBC (SUTURE) ×1 IMPLANT
SUT VIC AB 1 CT1 27 (SUTURE) ×9
SUT VIC AB 1 CT1 27XBRD ANTBC (SUTURE) ×3 IMPLANT
SYR 20CC LL (SYRINGE) ×6 IMPLANT
TAPE CLOTH SURG 4X10 WHT LF (GAUZE/BANDAGES/DRESSINGS) ×3 IMPLANT
TOWEL OR 17X26 10 PK STRL BLUE (TOWEL DISPOSABLE) ×3 IMPLANT

## 2016-07-26 NOTE — H&P (Signed)
Shannon Blair is an 83 y.o. female.   Chief Complaint: Pain in both legs HPI: She describes ptogressive pain in both legs with walking.  Past Medical History:  Diagnosis Date  . Anal cancer ([place]) 2004   Non-surgical mgt with chemo-radiation.  Dr Alen Blew follows.   . Anal fissure   . Colon, diverticulosis    Sigmoid. With associated stenosis.  Colonoscopy failed to intubate beyond stenosis in 08/2010.  rectal bx then negative for recurrent cancer.   . Hypertension   . OA (osteoarthritis)   . Squamous cell carcinoma    skin  . Varicose veins of left lower extremity with pain     Past Surgical History:  Procedure Laterality Date  . ABDOMINAL HYSTERECTOMY    . APPENDECTOMY    . BLADDER REPAIR    . CATARACT EXTRACTION  05/2004    dr Ishmael Holter  . NEPHRECTOMY  1970's   Rt. "Shrivelled up" ,  no cancer.   Marland Kitchen POLYPECTOMY      Family History  Problem Relation Age of Onset  . Coronary artery disease Father   . Leukemia Brother   . Colon cancer Brother   . Breast cancer Sister   . Ovarian cancer Sister   . Diabetes Daughter    Social History:  reports that she has never smoked. She has never used smokeless tobacco. She reports that she does not drink alcohol or use drugs.  Allergies: No Known Allergies  Medications Prior to Admission  Medication Sig Dispense Refill  . aspirin EC 81 MG tablet Take 81 mg by mouth daily.    . cephALEXin (KEFLEX) 500 MG capsule Take 500 mg by mouth 4 (four) times daily.    Marland Kitchen donepezil (ARICEPT) 10 MG tablet Take 10 mg by mouth at bedtime.     . irbesartan (AVAPRO) 300 MG tablet Take 300 mg by mouth daily.    . memantine (NAMENDA) 10 MG tablet Take 10 mg by mouth 2 (two) times daily.    Marland Kitchen oxybutynin (DITROPAN) 5 MG tablet Take 5 mg by mouth 2 (two) times daily.    . simvastatin (ZOCOR) 40 MG tablet Take 40 mg by mouth daily at 6 PM.     . venlafaxine (EFFEXOR) 37.5 MG tablet Take 37.5 mg by mouth 2 (two) times daily.    Marland Kitchen acetaminophen (TYLENOL) 325  MG tablet Take 650 mg by mouth every 6 (six) hours as needed for mild pain.     . calcium carbonate (OS-CAL) 600 MG TABS Take 600 mg by mouth 2 (two) times daily with a meal.      . ibuprofen (ADVIL,MOTRIN) 600 MG tablet Take 1 tablet (600 mg total) by mouth every 6 (six) hours as needed for pain. (Patient not taking: Reported on 12/24/2015) 30 tablet 0  . Misc Natural Products (OSTEO BI-FLEX ADV JOINT SHIELD PO) Take 1 tablet by mouth daily.      . Multiple Vitamin (MULITIVITAMIN WITH MINERALS) TABS Take 1 tablet by mouth daily.    . naproxen sodium (ANAPROX) 220 MG tablet Take 440 mg by mouth 2 (two) times daily as needed (for pain).    . Omega-3 Fatty Acids (FISH OIL) 1000 MG CAPS Take 1 capsule by mouth daily.      . vitamin C (ASCORBIC ACID) 500 MG tablet Take 500 mg by mouth daily.      . vitamin E 200 UNIT capsule Take 200 Units by mouth 3 (three) times daily.  No results found for this or any previous visit (from the past 48 hour(s)). No results found.  Review of Systems  Constitutional: Negative.   HENT: Negative.   Eyes: Negative.   Respiratory: Negative.   Cardiovascular: Negative.   Gastrointestinal: Negative.   Genitourinary: Negative.   Musculoskeletal: Positive for back pain.  Skin: Negative.   Neurological: Positive for focal weakness.  Psychiatric/Behavioral: Negative.     Height 5\' 2"  (1.575 m), weight 63.5 kg (140 lb). Physical Exam  Constitutional: She appears well-developed.  HENT:  Head: Normocephalic.  Eyes: Pupils are equal, round, and reactive to light.  Neck: Normal range of motion.  Cardiovascular: Normal rate.   Respiratory: Effort normal.  GI: Soft.  Musculoskeletal: She exhibits tenderness.  Neurological:  Weakness of left foot dorsiflexors.  Skin: Skin is warm.  Psychiatric: She has a normal mood and affect.     Assessment/Plan Complete Lumbar decompressive Laminectomies at L-4-L-5 and L-5-S-1 and Microdiscectomies. And Foraminotomies  at both Levels.  Tobi Bastos, MD 07/26/2016, 11:32 AM

## 2016-07-26 NOTE — Interval H&P Note (Signed)
History and Physical Interval Note:  07/26/2016 11:38 AM  Shannon Blair  has presented today for surgery, with the diagnosis of Spinal Stenosis   The various methods of treatment have been discussed with the patient and family. After consideration of risks, benefits and other options for treatment, the patient has consented to  Procedure(s): Central decompression, lumbar laminectomy L4-L5 and L5-S1, microdisectomy L5-S1 and L4-L5 (N/A) as a surgical intervention .  The patient's history has been reviewed, patient examined, no change in status, stable for surgery.  I have reviewed the patient's chart and labs.  Questions were answered to the patient's satisfaction.     Halley Kincer A

## 2016-07-26 NOTE — Anesthesia Postprocedure Evaluation (Signed)
Anesthesia Post Note  Patient: Shannon Blair  Procedure(s) Performed: Procedure(s) (LRB): Central decompression, lumbar laminectomy L4-L5 and L5-S1, foraminotmy L5-S1 and L4-L5 bilateral (N/A)  Patient location during evaluation: PACU Anesthesia Type: General Level of consciousness: awake and alert Pain management: pain level controlled Vital Signs Assessment: post-procedure vital signs reviewed and stable Respiratory status: spontaneous breathing, nonlabored ventilation and respiratory function stable Cardiovascular status: blood pressure returned to baseline and stable Postop Assessment: no signs of nausea or vomiting Anesthetic complications: no       Last Vitals:  Vitals:   07/26/16 1515 07/26/16 1530  BP: (!) 176/71 (!) 169/70  Pulse: 78 80  Resp: 15 15  Temp:  36.2 C    Last Pain:  Vitals:   07/26/16 1530  TempSrc:   PainSc: 0-No pain                 Lynda Rainwater

## 2016-07-26 NOTE — Anesthesia Procedure Notes (Signed)
Procedure Name: Intubation Date/Time: 07/26/2016 12:11 PM Performed by: Lollie Sails Pre-anesthesia Checklist: Patient identified, Emergency Drugs available, Suction available, Patient being monitored and Timeout performed Patient Re-evaluated:Patient Re-evaluated prior to inductionOxygen Delivery Method: Circle system utilized Preoxygenation: Pre-oxygenation with 100% oxygen Intubation Type: IV induction Ventilation: Mask ventilation without difficulty Laryngoscope Size: Miller and 2 Grade View: Grade I Tube type: Oral Tube size: 7.0 mm Number of attempts: 1 Airway Equipment and Method: Stylet Placement Confirmation: ETT inserted through vocal cords under direct vision,  positive ETCO2 and breath sounds checked- equal and bilateral Secured at: 22 cm Tube secured with: Tape Dental Injury: Teeth and Oropharynx as per pre-operative assessment

## 2016-07-26 NOTE — Brief Op Note (Signed)
07/26/2016  2:18 PM  PATIENT:  Shannon Blair  83 y.o. female  PRE-OPERATIVE DIAGNOSIS:  Spinal Stenosis,Severe at L-4-L-5 and L-5-S-1,TWO levels. Foraminal Stenosis at TWO levels and Bilaterally.Partial Foot Drop on the Left.  POST-OPERATIVE DIAGNOSIS:  Same as Pre-Op. PROCEDURE:  Procedure(s): Central decompression, lumbar laminectomy L4-L5 and L5-S1, foraminotmy L5-S1 and L4-L5 bilateral (N/A),all for Spinal and Foraminal Stenosis.  SURGEON:  Surgeon(s) and Role:    * Latanya Maudlin, MD - Primary  PHYSICIAN ASSISTANT:Amber Oglesby PA   ASSISTANTS: Ardeen Jourdain PA  ANESTHESIA:   general  EBL:  Total I/O In: 1250 [I.V.:1000; IV Piggyback:250] Out: 425 [Urine:350; Blood:75]  BLOOD ADMINISTERED:none  DRAINS: none   LOCAL MEDICATIONS USED:  LIDOCAINE 20cc of 1% with Epinephrine at start of the case and 20cc of Exparel mixed with Normal Saline at the end of the case   SPECIMEN:  No Specimen  DISPOSITION OF SPECIMEN:  N/A  COUNTS:  YES  TOURNIQUET:  * No tourniquets in log *  DICTATION: .Other Dictation: Dictation Number [id number]  PLAN OF CARE: Admit for overnight observation  PATIENT DISPOSITION:  PACU - hemodynamically stable.   Delay start of Pharmacological VTE agent (>24hrs) due to surgical blood loss or risk of bleeding: yes

## 2016-07-26 NOTE — Anesthesia Preprocedure Evaluation (Signed)
Anesthesia Evaluation  Patient identified by MRN, date of birth, ID band Patient awake    Reviewed: Allergy & Precautions, NPO status , Patient's Chart, lab work & pertinent test results  Airway Mallampati: II       Dental  (+) Edentulous Upper   Pulmonary    breath sounds clear to auscultation       Cardiovascular hypertension,  Rhythm:Regular Rate:Normal     Neuro/Psych negative neurological ROS     GI/Hepatic negative GI ROS, Neg liver ROS,   Endo/Other  negative endocrine ROS  Renal/GU nephrectomy     Musculoskeletal  (+) Arthritis ,   Abdominal   Peds  Hematology negative hematology ROS (+)   Anesthesia Other Findings   Reproductive/Obstetrics                             Anesthesia Physical Anesthesia Plan  ASA: III  Anesthesia Plan: General   Post-op Pain Management:    Induction: Intravenous  Airway Management Planned: Oral ETT  Additional Equipment:   Intra-op Plan:   Post-operative Plan: Extubation in OR  Informed Consent: I have reviewed the patients History and Physical, chart, labs and discussed the procedure including the risks, benefits and alternatives for the proposed anesthesia with the patient or authorized representative who has indicated his/her understanding and acceptance.     Plan Discussed with:   Anesthesia Plan Comments:         Anesthesia Quick Evaluation

## 2016-07-26 NOTE — Transfer of Care (Signed)
Immediate Anesthesia Transfer of Care Note  Patient: Shannon Blair  Procedure(s) Performed: Procedure(s): Central decompression, lumbar laminectomy L4-L5 and L5-S1, foraminotmy L5-S1 and L4-L5 bilateral (N/A)  Patient Location: PACU  Anesthesia Type:General  Level of Consciousness:  sedated, patient cooperative and responds to stimulation  Airway & Oxygen Therapy:Patient Spontanous Breathing and Patient connected to face mask oxgen  Post-op Assessment:  Report given to PACU RN and Post -op Vital signs reviewed and stable  Post vital signs:  Reviewed and stable  Last Vitals:  Vitals:   07/26/16 0840  BP: 115/88  Pulse: 62  Resp: 16  Temp: 62.2 C    Complications: No apparent anesthesia complications

## 2016-07-27 ENCOUNTER — Encounter (HOSPITAL_COMMUNITY): Payer: Self-pay | Admitting: Orthopedic Surgery

## 2016-07-27 DIAGNOSIS — M4807 Spinal stenosis, lumbosacral region: Secondary | ICD-10-CM | POA: Diagnosis not present

## 2016-07-27 DIAGNOSIS — R269 Unspecified abnormalities of gait and mobility: Secondary | ICD-10-CM | POA: Diagnosis not present

## 2016-07-27 DIAGNOSIS — I10 Essential (primary) hypertension: Secondary | ICD-10-CM | POA: Diagnosis not present

## 2016-07-27 DIAGNOSIS — Z9221 Personal history of antineoplastic chemotherapy: Secondary | ICD-10-CM | POA: Diagnosis not present

## 2016-07-27 DIAGNOSIS — Z923 Personal history of irradiation: Secondary | ICD-10-CM | POA: Diagnosis not present

## 2016-07-27 DIAGNOSIS — Z79899 Other long term (current) drug therapy: Secondary | ICD-10-CM | POA: Diagnosis not present

## 2016-07-27 DIAGNOSIS — Z7982 Long term (current) use of aspirin: Secondary | ICD-10-CM | POA: Diagnosis not present

## 2016-07-27 DIAGNOSIS — Z85828 Personal history of other malignant neoplasm of skin: Secondary | ICD-10-CM | POA: Diagnosis not present

## 2016-07-27 DIAGNOSIS — M48062 Spinal stenosis, lumbar region with neurogenic claudication: Secondary | ICD-10-CM | POA: Diagnosis not present

## 2016-07-27 DIAGNOSIS — Z85048 Personal history of other malignant neoplasm of rectum, rectosigmoid junction, and anus: Secondary | ICD-10-CM | POA: Diagnosis not present

## 2016-07-27 MED ORDER — METHOCARBAMOL 500 MG PO TABS: 500.0000 mg | ORAL_TABLET | Freq: Four times a day (QID) | ORAL | 0 refills | Status: DC | PRN

## 2016-07-27 MED ORDER — HYDROCODONE-ACETAMINOPHEN 5-325 MG PO TABS: 1.0000 | ORAL_TABLET | Freq: Four times a day (QID) | ORAL | 0 refills | Status: DC | PRN

## 2016-07-27 NOTE — Progress Notes (Signed)
   Subjective: 1 Day Post-Op Procedure(s) (LRB): Central decompression, lumbar laminectomy L4-L5 and L5-S1, foraminotmy L5-S1 and L4-L5 bilateral (N/A) Patient reports pain as mild.   Patient seen in rounds for Dr. Gladstone Lighter. Patient is well, and has had no acute complaints or problems. Reports minimal pain. NO SOB or chest pain. No lightheadedness or dizziness. No headaches. No blurred vision.  Plan is to go Home after hospital stay.  Objective: Vital signs in last 24 hours: Temp:  [97.2 F (36.2 C)-98.7 F (37.1 C)] 98.3 F (36.8 C) (05/10 0542) Pulse Rate:  [56-83] 56 (05/10 0542) Resp:  [14-16] 14 (05/10 0542) BP: (115-178)/(48-88) 170/57 (05/10 0542) SpO2:  [93 %-100 %] 98 % (05/10 0542) Weight:  [63.5 kg (140 lb)] 63.5 kg (140 lb) (05/09 0925)  Intake/Output from previous day:  Intake/Output Summary (Last 24 hours) at 07/27/16 0723 Last data filed at 07/27/16 0600  Gross per 24 hour  Intake             3405 ml  Output             1975 ml  Net             1430 ml     EXAM General - Patient is Alert and Oriented Extremity - Neurologically intact Intact pulses distally Dorsiflexion/Plantar flexion intact Compartment soft Dressing/Incision - clean, dry, no drainage Motor Function - intact, moving foot and toes well on exam.   Past Medical History:  Diagnosis Date  . Anal cancer ([place]) 2004   Non-surgical mgt with chemo-radiation.  Dr Alen Blew follows.   . Anal fissure   . Colon, diverticulosis    Sigmoid. With associated stenosis.  Colonoscopy failed to intubate beyond stenosis in 08/2010.  rectal bx then negative for recurrent cancer.   . Hypertension   . OA (osteoarthritis)   . Squamous cell carcinoma    skin  . Varicose veins of left lower extremity with pain     Assessment/Plan: 1 Day Post-Op Procedure(s) (LRB): Central decompression, lumbar laminectomy L4-L5 and L5-S1, foraminotmy L5-S1 and L4-L5 bilateral (N/A) Active Problems:   Spinal stenosis, lumbar  region with neurogenic claudication  Estimated body mass index is 25.61 kg/m as calculated from the following:   Height as of this encounter: 5\' 2"  (1.575 m).   Weight as of this encounter: 63.5 kg (140 lb). Advance diet Up with therapy D/C IV fluids when tolerating PO well  DVT Prophylaxis - Aspirin Weight-Bearing as tolerated LE NWB left UE  Will start therapy this morning. Will have her ambulate with cane due her NWB status on the left UE with the radial neck fracture. Should continue to wear sling on left UE. Will give regular BP meds at 1000. Continue to monitor BP. If BP is stable and therapy goes well, plan for DC home this afternoon.   Ardeen Jourdain, PA-C Orthopaedic Surgery 07/27/2016, 7:23 AM

## 2016-07-27 NOTE — Discharge Instructions (Signed)
For the first three days, remove your dressing, tape a piece of saran wrap over your incision Take your shower, then remove the saran wrap and put a clean dressing on, then reapply your sling. After three days you  can shower without the saran wrap.  Use cane to walk instead of walker since the left arm cannot support weight. No lifting or bending. Call Dr. Gladstone Lighter if any wound complications or temperature of 101 degrees F or over.  Call the office for an appointment to see Dr. Gladstone Lighter in two weeks: [phone]

## 2016-07-27 NOTE — Progress Notes (Signed)
07/27/16 1500  PT Visit Information  Last PT Received On 07/27/16  Assistance Needed +1  History of Present Illness Pt had back sx per Dr Rushie Nyhan :Central decompression, lumbar laminectomy L4-L5 and L5-S1, foraminotmy L5-S1 and L4-L5 bilateral (N/A),all for Spinal and Foraminal Stenosis. Pt also had a L radius fx with a sling and is NWB  Subjective Data  Subjective Pt and family member provided with back handout and reviewed log roll technique, sleeping positions and maintaining back precautions.  Pt ambulated again with quad cane and performed steps.  Family member (pt's niece) present and educated on cues to provide and aware of precautions.  Pt will likely d/c home with family assist later today.  Precautions  Precautions Back  Required Braces or Orthoses Sling  Restrictions  Weight Bearing Restrictions Yes  LUE Weight Bearing NWB  Pain Assessment  Pain Assessment 0-10  Pain Score 5  Pain Location back  Pain Descriptors / Indicators Aching;Sore  Pain Intervention(s) Limited activity within patient's tolerance;Monitored during session;Repositioned;Patient requesting pain meds-RN notified  Cognition  Arousal/Alertness Awake/alert  Behavior During Therapy WFL for tasks assessed/performed  Overall Cognitive Status History of cognitive impairments - at baseline  General Comments pt requiring cues for back precautions  Bed Mobility  Overal bed mobility Needs Assistance  Bed Mobility Rolling;Sidelying to Sit;Sit to Sidelying  Rolling Min guard  Sidelying to sit Min guard  Sit to sidelying Min guard  General bed mobility comments tactile and visual cues for log roll technique  Transfers  Overall transfer level Needs assistance  Equipment used Quad cane  Transfers Sit to/from Stand  Sit to [employer] guard  General transfer comment verbal cues for hand placement  Ambulation/Gait  Ambulation/Gait assistance Min assist  Ambulation Distance (Feet) 160 Feet  Assistive device Quad cane   Gait Pattern/deviations Step-through pattern;Decreased stride length;Narrow base of support  General Gait Details verbal cues for back precautions and use of quad cane, pt unsteady and requiring min assist occasionally for LOB which did not improve with distance this afternoon likely due to fatigue, educated pt to perform small bouts of activity and always with assist present for safety  Stairs Yes  Stairs assistance Min guard  Stair Management One rail Right;Step to pattern;Forwards  Number of Stairs 5  General stair comments verbal cues for sequence, safety and technique, niece present and educated, min/guard for safety  Balance  Overall balance assessment History of Falls  PT - End of Session  Equipment Utilized During Treatment Gait belt;Other (comment) (sling)  Activity Tolerance Patient tolerated treatment well  Patient left in bed;with call bell/phone within reach;with family/visitor present  Nurse Communication Mobility status  PT - Assessment/Plan  PT Plan Current plan remains appropriate  PT Visit Diagnosis Unsteadiness on feet (R26.81)  PT Frequency (ACUTE ONLY) Min 5X/week  Follow Up Recommendations Home health PT;Supervision/Assistance - 24 hour  PT equipment Other (comment) (small based quad cane)  AM-PAC PT "6 Clicks" Daily Activity Outcome Measure  Difficulty turning over in bed (including adjusting bedclothes, sheets and blankets)? 3  Difficulty moving from lying on back to sitting on the side of the bed?  3  Difficulty sitting down on and standing up from a chair with arms (e.g., wheelchair, bedside commode, etc,.)? 3  Help needed moving to and from a bed to chair (including a wheelchair)? 3  Help needed walking in hospital room? 3  Help needed climbing 3-5 steps with a railing?  3  6 Click Score 18  Mobility G  Code  CK  PT Goal Progression  Progress towards PT goals Progressing toward goals  PT Time Calculation  PT Start Time (ACUTE ONLY) 1339  PT Stop Time  (ACUTE ONLY) 1358  PT Time Calculation (min) (ACUTE ONLY) 19 min  PT General Charges  $$ ACUTE PT VISIT 1 Procedure  PT Treatments  $Gait Training 8-22 mins   Carmelia Bake, PT, DPT 07/27/2016 Pager: [phone]

## 2016-07-27 NOTE — Evaluation (Signed)
Physical Therapy Evaluation Patient Details Name: Shannon Blair MRN: [id number] DOB: 05-10-1931 Today's Date: 07/27/2016   History of Present Illness  Pt had back sx per Dr Rushie Nyhan :Central decompression, lumbar laminectomy L4-L5 and L5-S1, foraminotmy L5-S1 and L4-L5 bilateral (N/A),all for Spinal and Foraminal Stenosis. Pt also had a L radius fx with a sling and is NWB  Clinical Impression  Patient is s/p above surgery resulting in the deficits listed below (see PT Problem List).  Patient will benefit from skilled PT to increase their independence and safety with mobility (while adhering to their precautions) to allow discharge to the venue listed below.  Pt initially unsteady with LOB during gait with quad cane however improved with distance.  Plan per chart and pt is to d/c home with assist from sister.  Plan to return this afternoon to practice gait again and stair training.  Possible d/c home later today per nursing.    Follow Up Recommendations Home health PT;Supervision/Assistance - 24 hour    Equipment Recommendations  Other (comment) (small based quad cane)    Recommendations for Other Services       Precautions / Restrictions Precautions Precautions: Back Precaution Comments: pt only able to recall one back precaution, reviewed and demonstrated each precaution during mobility Required Braces or Orthoses: Sling Restrictions Weight Bearing Restrictions: Yes LUE Weight Bearing: Non weight bearing      Mobility  Bed Mobility Overal bed mobility: Needs Assistance Bed Mobility: Rolling;Sit to Sidelying Rolling: Min guard       Sit to sidelying: Min guard General bed mobility comments: tactile and visual cues for log roll technique  Transfers Overall transfer level: Needs assistance Equipment used: Quad cane Transfers: Sit to/from Stand Sit to Stand: Min assist Stand pivot transfers: Mod assist       General transfer comment: verbal cues for hand placement and  assist to steady with rise  Ambulation/Gait Ambulation/Gait assistance: Min assist Ambulation Distance (Feet): 100 Feet Assistive device: Quad cane Gait Pattern/deviations: Step-through pattern;Decreased stride length;Narrow base of support     General Gait Details: verbal cues for back precautions and use of quad cane, pt unsteady and requiring min assist occasionally for LOB however this improved with distance  Stairs            Wheelchair Mobility    Modified Rankin (Stroke Patients Only)       Balance Overall balance assessment: History of Falls                                           Pertinent Vitals/Pain Pain Assessment: 0-10 Pain Score: 3  Pain Location: back Pain Descriptors / Indicators: Sore Pain Intervention(s): Limited activity within patient's tolerance;Monitored during session;Repositioned    Home Living Family/patient expects to be discharged to:: Private residence Living Arrangements: Alone Available Help at Discharge: Family Type of Home: House Home Access: Stairs to enter   Technical brewer of Steps: 1 Home Layout: Able to live on main level with bedroom/bathroom Home Equipment: Toilet riser;Cane - single point      Prior Function Level of Independence: Independent               Hand Dominance        Extremity/Trunk Assessment   Upper Extremity Assessment Upper Extremity Assessment: LUE deficits/detail LUE Deficits / Details: LUE in sling due to fx from fall. Pt able to move  L hand and shoulder. Pt did need VC to not attempt to push up with LUE    Lower Extremity Assessment Lower Extremity Assessment: Generalized weakness       Communication   Communication: No difficulties  Cognition Arousal/Alertness: Awake/alert Behavior During Therapy: WFL for tasks assessed/performed Overall Cognitive Status: History of cognitive impairments - at baseline                                  General Comments: pt requiring cues for back precautions      General Comments      Exercises     Assessment/Plan    PT Assessment Patient needs continued PT services  PT Problem List Decreased strength;Decreased mobility;Decreased safety awareness;Decreased knowledge of precautions;Decreased knowledge of use of DME;Decreased balance;Pain       PT Treatment Interventions DME instruction;Therapeutic exercise;Therapeutic activities;Patient/family education;Gait training;Functional mobility training;Stair training;Balance training    PT Goals (Current goals can be found in the Care Plan section)  Acute Rehab PT Goals Patient Stated Goal: home with sister  PT Goal Formulation: With patient Time For Goal Achievement: 08/03/16 Potential to Achieve Goals: Good    Frequency Min 5X/week   Barriers to discharge        Co-evaluation               AM-PAC PT "6 Clicks" Daily Activity  Outcome Measure Difficulty turning over in bed (including adjusting bedclothes, sheets and blankets)?: A Little Difficulty moving from lying on back to sitting on the side of the bed? : A Little Difficulty sitting down on and standing up from a chair with arms (e.g., wheelchair, bedside commode, etc,.)?: A Little Help needed moving to and from a bed to chair (including a wheelchair)?: A Little Help needed walking in hospital room?: A Little Help needed climbing 3-5 steps with a railing? : A Lot 6 Click Score: 17    End of Session Equipment Utilized During Treatment: Gait belt;Other (comment) (L UE sling) Activity Tolerance: Patient tolerated treatment well Patient left: in bed;with call bell/phone within reach Nurse Communication: Mobility status PT Visit Diagnosis: Unsteadiness on feet (R26.81)    Time: 7416-3845 PT Time Calculation (min) (ACUTE ONLY): 16 min   Charges:   PT Evaluation $PT Eval Low Complexity: 1 Procedure     PT G Codes:   PT G-Codes **NOT FOR INPATIENT  CLASS** Functional Assessment Tool Used: AM-PAC 6 Clicks Basic Mobility;Clinical judgement Functional Limitation: Mobility: Walking and moving around Mobility: Walking and Moving Around Current Status (X6468): At least 20 percent but less than 40 percent impaired, limited or restricted Mobility: Walking and Moving Around Goal Status [phone]): At least 1 percent but less than 20 percent impaired, limited or restricted    Carmelia Bake, PT, DPT 07/27/2016 Pager: [phone]   York Ram E 07/27/2016, 12:06 PM

## 2016-07-27 NOTE — Progress Notes (Signed)
Paged MD about pt BP 170/57 and pulse 56. Pt denies any pain.  Received call back from MD, he stated since she is currently asymptomatic to inform am shift/MD and if change occurs to call back to consult.

## 2016-07-27 NOTE — Evaluation (Signed)
Occupational Therapy Evaluation Patient Details Name: Shannon Blair MRN: [id number] DOB: 10-11-1931 Today's Date: 07/27/2016    History of Present Illness Pt had back sx per Dr Rushie Nyhan :Central decompression, lumbar laminectomy L4-L5 and L5-S1, foraminotmy L5-S1 and L4-L5 bilateral (N/A),all for Spinal and Foraminal Stenosis. Pt also had a L radius fx with a sling and is NWB   Clinical Impression   Patient is s/p back surgery resulting in the deficits listed below (see OT Problem List).  Patient will benefit from skilled OT to increase their safety and independence with ADL and functional mobility for ADL (while adhering to their precautions) to facilitate discharge to venue listed below.     Follow Up Recommendations  Home health OT;Supervision/Assistance - 24 hour;Other (comment)    Equipment Recommendations  3 in 1 bedside commode       Precautions / Restrictions Precautions Precautions: Back Required Braces or Orthoses: Sling (LUE) Restrictions Weight Bearing Restrictions: Yes LUE Weight Bearing: Non weight bearing (NWB L elbow)      Mobility Bed Mobility               General bed mobility comments: pt in chair  Transfers Overall transfer level: Needs assistance Equipment used: 1 person hand held assist Transfers: Sit to/from [employer] Transfers Sit to Stand: Mod assist Stand pivot transfers: Mod assist       General transfer comment: pt with decreased balance with walking to bathroom - unsure if sister will be able to A pt at this level        ADL either performed or assessed with clinical judgement   ADL Overall ADL's : Needs assistance/impaired             Lower Body Bathing: Maximal assistance;Sit to/from stand;Cueing for safety;Cueing for sequencing;Cueing for compensatory techniques   Upper Body Dressing : Moderate assistance;Sitting Upper Body Dressing Details (indicate cue type and reason): sling Lower Body Dressing: Maximal  assistance;Sit to/from stand;Cueing for safety;Cueing for sequencing;Cueing for compensatory techniques;Cueing for back precautions   Toilet Transfer: Moderate assistance;Ambulation;Cueing for sequencing;Cueing for safety;Comfort height toilet   Toileting- Clothing Manipulation and Hygiene: Maximal assistance;Cueing for safety;Sit to/from stand;Cueing for sequencing;Cueing for compensatory techniques         General ADL Comments: sister will A upon DC. Do not feel pt will be ready for Dc this day                  Pertinent Vitals/Pain Pain Assessment: 0-10 Pain Score: 2  Pain Location: back Pain Descriptors / Indicators: Sore Pain Intervention(s): Monitored during session;Repositioned;Limited activity within patient's tolerance     Hand Dominance     Extremity/Trunk Assessment Upper Extremity Assessment Upper Extremity Assessment: LUE deficits/detail LUE Deficits / Details: LUE in sling due to fx from fall. Pt able to move L hand and shoulder. Pt did need VC to not attempt to push up with LUE           Communication Communication Communication: No difficulties   Cognition Arousal/Alertness: Awake/alert Behavior During Therapy: WFL for tasks assessed/performed Overall Cognitive Status: History of cognitive impairments - at baseline                                 General Comments: pt needed VC for back precautions. Pt able to recall 1/3 at end of session.  Sister will likely have to A pt recall.   General Comments  Home Living Family/patient expects to be discharged to:: Private residence Living Arrangements: Alone Available Help at Discharge: Family Type of Home: House Home Access: Stairs to enter [employer] of Steps: 1   Home Layout: One level;Multi-level;Able to live on main level with bedroom/bathroom     Bathroom Shower/Tub: Teacher, early years/pre: Standard     Home Equipment: Toilet riser           Prior Functioning/Environment Level of Independence: Independent                 OT Problem List: Decreased strength;Decreased activity tolerance;Impaired UE functional use;Decreased knowledge of use of DME or AE;Decreased safety awareness;Decreased knowledge of precautions      OT Treatment/Interventions: Self-care/ADL training;DME and/or AE instruction;Patient/family education    OT Goals(Current goals can be found in the care plan section) Acute Rehab OT Goals Patient Stated Goal: home with sister  OT Goal Formulation: With patient Time For Goal Achievement: 08/10/16  OT Frequency: Min 2X/week              AM-PAC PT "6 Clicks" Daily Activity     Outcome Measure Help from another person eating meals?: A Little Help from another person taking care of personal grooming?: A Little Help from another person toileting, which includes using toliet, bedpan, or urinal?: A Lot Help from another person bathing (including washing, rinsing, drying)?: A Lot Help from another person to put on and taking off regular upper body clothing?: A Lot Help from another person to put on and taking off regular lower body clothing?: A Lot 6 Click Score: 14   End of Session Equipment Utilized During Treatment: Gait belt Nurse Communication: Mobility status  Activity Tolerance: Patient tolerated treatment well Patient left: in chair;with call bell/phone within reach;with family/visitor present  OT Visit Diagnosis: Unsteadiness on feet (R26.81);History of falling (Z91.81)                Time: 0930-1001 OT Time Calculation (min): 31 min Charges:  OT General Charges $OT Visit: 1 Procedure OT Evaluation $OT Eval Moderate Complexity: 1 Procedure OT Treatments $Self Care/Home Management : 8-22 mins G-Codes: OT G-codes **NOT FOR INPATIENT CLASS** Functional Assessment Tool Used: Clinical judgement Functional Limitation: Self care Self Care Current Status (O7121): At least 60 percent but  less than 80 percent impaired, limited or restricted Self Care Goal Status (F7588): At least 1 percent but less than 20 percent impaired, limited or restricted   Kari Baars, [place]  Payton Mccallum D 07/27/2016, 10:18 AM

## 2016-07-27 NOTE — Progress Notes (Signed)
    Durable Medical Equipment        Start     Ordered   07/27/16 1445  For home use only DME 3 n 1  Once     07/27/16 1445   07/27/16 1429  For home use only DME Cane  Once    Comments:  Quad cane   07/27/16 1428    [phone]

## 2016-07-27 NOTE — Op Note (Signed)
[name], [name]:  [id number]  MEDICAL RECORD NO.:  [id number]  LOCATION:                                 FACILITY:  PHYSICIAN:  Kipp Brood. Jamee Keach, M.D.DATE OF BIRTH:  [birth year]  DATE OF PROCEDURE:  07/26/2016 DATE OF DISCHARGE:                              OPERATIVE REPORT   SURGEON:  Kipp Brood. Gladstone Lighter, M.D.  ASSISTANT:  Ardeen Jourdain, [place]  PREOPERATIVE DIAGNOSES: 1. Severe spinal stenosis is just about a complete block at L4-5. 2. Severe spinal stenosis with just about a complete block at L5-S1. 3. Foraminal stenosis involving the L5 root bilaterally. 4. Foraminal stenosis involving the S1 root bilaterally.  POSTOPERATIVE DIAGNOSES: 1. Severe spinal stenosis is just about a complete block at L4-5. 2. Severe spinal stenosis with just about a complete block at L5-S1. 3. Foraminal stenosis involving the L5 root bilaterally. 4. Foraminal stenosis involving the S1 root bilaterally.  OPERATION: 1. Complete decompressive lumbar laminectomy at L4-L5 for spinal     stenosis. 2. Complete lumbar laminectomy at L5-S1 for spinal stenosis. 3. Foraminotomy for the L5 root on the left. 4. Foraminotomy for the L5 root on the right. 5. Foraminotomy for the S1 root on the left. 6. Foraminotomy for the S1 root on the right. So, we did bilateral foraminotomies at 2 levels.  DESCRIPTION OF PROCEDURE:  Under general anesthesia, the patient on spinal frame, routine orthopedic prep and draping of the back was carried out.  At this time, appropriate time-out was carried out, also marked the appropriate left side because of the partial footdrop on the left, even though we went central.  Incision then was made in usual fashion after she had 2 g of IV Ancef.  Bleeders were identified and cauterized.  I then stripped the muscle from the lamina and spinous processes bilaterally.  The self-retaining retractors were inserted.  X- ray was taken.  We then remained  continued to clean off the muscle off the lamina and spinous processes.  We then inserted the [facility] retractors.  We then began our laminectomies in the usual fashion, removed the spinous processes at L4-L5 and L5-S1 and did our laminectomies to completely expose the dura.  Note, the dura was extremely tight at the L4-5 level.  We had to go through a painstaking procedure to gradually curette off the soft tissue and bone.  There was a large bony bridge.  There was no major opening air at the beginning. We had a great opening.  We then were able to easily identify the dura and the microscope was brought in and we then continued to remove the bone centrally and out into the lateral recesses.  We then did foraminotomies bilaterally at 2 levels.  We now were easily able to pass a hockey stick out the foramina as well as proximally and distally.  The dissection was carried out in both directions proximally and distally until we had complete freedom with the hockey stick.  We thoroughly irrigated out the area, loosely applied some thrombin-soaked Gelfoam and then closed the deep part of the wound in usual fashion except we left small deep distal and proximal part of the wound open for  drainage purposes.  Subcu was closed in usual fashion and the skin with staples after we injected 20 mL of Exparel into the soft tissue.  At the beginning of the procedure, I injected 20 mL of 1% lidocaine with epinephrine in the soft tissue to control bleeding.  At the end of the procedure, sterile dressings were applied.  The patient left the operating room in satisfactory condition.  She did have a nondisplaced radial head fracture on the left, so great care was taken not to injure her elbow.          ______________________________ Kipp Brood Gladstone Lighter, M.D.     RAG/MEDQ  D:  07/26/2016  T:  07/27/2016  Job:  [id number]

## 2016-07-27 NOTE — Care Management Obs Status (Signed)
[facility] NOTIFICATION   Patient Details  Name: Shannon Blair MRN: [id number] Date of Birth: 01-Jul-1931   Medicare Observation Status Notification Given:  Yes    Guadalupe Maple, RN 07/27/2016, 2:46 PM

## 2016-07-29 DIAGNOSIS — Z4789 Encounter for other orthopedic aftercare: Secondary | ICD-10-CM | POA: Diagnosis not present

## 2016-07-29 DIAGNOSIS — I10 Essential (primary) hypertension: Secondary | ICD-10-CM | POA: Diagnosis not present

## 2016-07-29 DIAGNOSIS — S52302D Unspecified fracture of shaft of left radius, subsequent encounter for closed fracture with routine healing: Secondary | ICD-10-CM | POA: Diagnosis not present

## 2016-07-29 DIAGNOSIS — M1990 Unspecified osteoarthritis, unspecified site: Secondary | ICD-10-CM | POA: Diagnosis not present

## 2016-07-29 DIAGNOSIS — M48062 Spinal stenosis, lumbar region with neurogenic claudication: Secondary | ICD-10-CM | POA: Diagnosis not present

## 2016-07-29 DIAGNOSIS — W19XXXD Unspecified fall, subsequent encounter: Secondary | ICD-10-CM | POA: Diagnosis not present

## 2016-07-31 DIAGNOSIS — I10 Essential (primary) hypertension: Secondary | ICD-10-CM | POA: Diagnosis not present

## 2016-07-31 DIAGNOSIS — M48062 Spinal stenosis, lumbar region with neurogenic claudication: Secondary | ICD-10-CM | POA: Diagnosis not present

## 2016-07-31 DIAGNOSIS — M1990 Unspecified osteoarthritis, unspecified site: Secondary | ICD-10-CM | POA: Diagnosis not present

## 2016-07-31 DIAGNOSIS — Z4789 Encounter for other orthopedic aftercare: Secondary | ICD-10-CM | POA: Diagnosis not present

## 2016-07-31 DIAGNOSIS — W19XXXD Unspecified fall, subsequent encounter: Secondary | ICD-10-CM | POA: Diagnosis not present

## 2016-07-31 DIAGNOSIS — S52302D Unspecified fracture of shaft of left radius, subsequent encounter for closed fracture with routine healing: Secondary | ICD-10-CM | POA: Diagnosis not present

## 2016-07-31 NOTE — Discharge Summary (Signed)
Physician Discharge Summary   Patient ID: Shannon Blair MRN: [id number] DOB/AGE: Jul 22, 1931 83 y.o.  Admit date: 07/26/2016 Discharge date: 07/27/2016  Primary Diagnosis: Lumbar spinal stenosis  Admission Diagnoses:  Past Medical History:  Diagnosis Date  . Anal cancer ([place]) 2004   Non-surgical mgt with chemo-radiation.  Dr Alen Blew follows.   . Anal fissure   . Colon, diverticulosis    Sigmoid. With associated stenosis.  Colonoscopy failed to intubate beyond stenosis in 08/2010.  rectal bx then negative for recurrent cancer.   . Hypertension   . OA (osteoarthritis)   . Squamous cell carcinoma    skin  . Varicose veins of left lower extremity with pain    Discharge Diagnoses:   Active Problems:   Spinal stenosis, lumbar region with neurogenic claudication  Estimated body mass index is 25.61 kg/m as calculated from the following:   Height as of this encounter: 5' 2"  (1.575 m).   Weight as of this encounter: 63.5 kg (140 lb).  Procedure:  Procedure(s) (LRB): Central decompression, lumbar laminectomy L4-L5 and L5-S1, foraminotmy L5-S1 and L4-L5 bilateral (N/A)   Consults: None  HPI: The patient presented with the chief complaint of low back pain. She also developed pain ans weakness in both legs. CT myelogram showed significant stenosis at L4-5, L5-S1.   Laboratory Data: Hospital Outpatient Visit on 07/18/2016  Component Date Value Ref Range Status  . aPTT 07/18/2016 28  24 - 36 seconds Final  . WBC 07/18/2016 6.0  4.0 - 10.5 K/uL Final  . RBC 07/18/2016 4.61  3.87 - 5.11 MIL/uL Final  . Hemoglobin 07/18/2016 13.3  12.0 - 15.0 g/dL Final  . HCT 07/18/2016 39.7  36.0 - 46.0 % Final  . MCV 07/18/2016 86.1  78.0 - 100.0 fL Final  . MCH 07/18/2016 28.9  26.0 - 34.0 pg Final  . MCHC 07/18/2016 33.5  30.0 - 36.0 g/dL Final  . RDW 07/18/2016 13.8  11.5 - 15.5 % Final  . Platelets 07/18/2016 188  150 - 400 K/uL Final  . Neutrophils Relative % 07/18/2016 72  % Final  . Neutro  Abs 07/18/2016 4.3  1.7 - 7.7 K/uL Final  . Lymphocytes Relative 07/18/2016 18  % Final  . Lymphs Abs 07/18/2016 1.1  0.7 - 4.0 K/uL Final  . Monocytes Relative 07/18/2016 6  % Final  . Monocytes Absolute 07/18/2016 0.4  0.1 - 1.0 K/uL Final  . Eosinophils Relative 07/18/2016 4  % Final  . Eosinophils Absolute 07/18/2016 0.2  0.0 - 0.7 K/uL Final  . Basophils Relative 07/18/2016 0  % Final  . Basophils Absolute 07/18/2016 0.0  0.0 - 0.1 K/uL Final  . Sodium 07/18/2016 142  135 - 145 mmol/L Final  . Potassium 07/18/2016 3.8  3.5 - 5.1 mmol/L Final  . Chloride 07/18/2016 108  101 - 111 mmol/L Final  . CO2 07/18/2016 27  22 - 32 mmol/L Final  . Glucose, Bld 07/18/2016 93  65 - 99 mg/dL Final  . BUN 07/18/2016 12  6 - 20 mg/dL Final  . Creatinine, Ser 07/18/2016 0.85  0.44 - 1.00 mg/dL Final  . Calcium 07/18/2016 9.5  8.9 - 10.3 mg/dL Final  . Total Protein 07/18/2016 6.7  6.5 - 8.1 g/dL Final  . Albumin 07/18/2016 4.0  3.5 - 5.0 g/dL Final  . AST 07/18/2016 18  15 - 41 U/L Final  . ALT 07/18/2016 18  14 - 54 U/L Final  . Alkaline Phosphatase 07/18/2016 94  38 -  126 U/L Final  . Total Bilirubin 07/18/2016 0.6  0.3 - 1.2 mg/dL Final  . GFR calc non Af Amer 07/18/2016 >60  >60 mL/min Final  . GFR calc Af Amer 07/18/2016 >60  >60 mL/min Final   Comment: (NOTE) The eGFR has been calculated using the CKD EPI equation. This calculation has not been validated in all clinical situations. eGFR's persistently <60 mL/min signify possible Chronic Kidney Disease.   . Anion gap 07/18/2016 7  5 - 15 Final  . Prothrombin Time 07/18/2016 13.4  11.4 - 15.2 seconds Final  . INR 07/18/2016 1.02   Final  . Color, Urine 07/18/2016 YELLOW  YELLOW Final  . APPearance 07/18/2016 CLEAR  CLEAR Final  . Specific Gravity, Urine 07/18/2016 1.013  1.005 - 1.030 Final  . pH 07/18/2016 5.0  5.0 - 8.0 Final  . Glucose, UA 07/18/2016 NEGATIVE  NEGATIVE mg/dL Final  . Hgb urine dipstick 07/18/2016 SMALL* NEGATIVE  Final  . Bilirubin Urine 07/18/2016 NEGATIVE  NEGATIVE Final  . Ketones, ur 07/18/2016 NEGATIVE  NEGATIVE mg/dL Final  . Protein, ur 07/18/2016 NEGATIVE  NEGATIVE mg/dL Final  . Nitrite 07/18/2016 NEGATIVE  NEGATIVE Final  . Leukocytes, UA 07/18/2016 MODERATE* NEGATIVE Final  . RBC / HPF 07/18/2016 6-30  0 - 5 RBC/hpf Final  . WBC, UA 07/18/2016 TOO NUMEROUS TO COUNT  0 - 5 WBC/hpf Final  . Bacteria, UA 07/18/2016 RARE* NONE SEEN Final  . Squamous Epithelial / LPF 07/18/2016 0-5* NONE SEEN Final  . Mucous 07/18/2016 PRESENT   Final  . MRSA, PCR 07/18/2016 NEGATIVE  NEGATIVE Final  . Staphylococcus aureus 07/18/2016 NEGATIVE  NEGATIVE Final   Comment:        The Xpert SA Assay (FDA approved for NASAL specimens in patients over 83 years of age), is one component of a comprehensive surveillance program.  Test performance has been validated by [facility] for patients greater than or equal to 15 year old. It is not intended to diagnose infection nor to guide or monitor treatment.      X-Rays:Dg Chest 2 View  Result Date: 07/18/2016 CLINICAL DATA:  Preoperative evaluation for lumbar surgery. Hypertension. EXAM: CHEST  2 VIEW COMPARISON:  Chest radiograph February 24, 2016 and chest CT March 03, 2016 FINDINGS: There are areas of mild scarring bilaterally. There is no edema or consolidation. Heart size and pulmonary vascularity are normal. No adenopathy. There is aortic atherosclerosis. There is degenerative change in the thoracic spine. There are surgical clips in the right renal fossa region. IMPRESSION: Areas of scarring bilaterally. No edema or consolidation. There is aortic atherosclerosis. Electronically Signed   By: Lowella Grip III M.D.   On: 07/18/2016 13:05   Dg Lumbar Spine 2-3 Views  Result Date: 07/18/2016 CLINICAL DATA:  Lumbago with difficulty ambulating EXAM: LUMBAR SPINE - 2-3 VIEW COMPARISON:  Lumbar spine radiographs June 29, 2015 and postmyelogram lumbar spine CT  January 10, 2016 FINDINGS: Frontal and lateral views were obtained. There are 5 non-rib-bearing lumbar type vertebral bodies. There is no fracture or spondylolisthesis. There is a moderate disc space narrowing at all levels. There are prominent lateral osteophytes in the left at L4 and L5. No erosive change. There is aortic atherosclerosis. There are surgical clips in the right renal fossa region. There is degenerative change in the left hip. IMPRESSION: Multilevel arthropathy. No fracture or spondylolisthesis. There is moderate osteoarthritic change in the left hip joint. There is aortic atherosclerosis. Electronically Signed   By: Lowella Grip  III M.D.   On: 07/18/2016 13:07   Dg Elbow Complete Left  Result Date: 07/22/2016 CLINICAL DATA:  83 year old female with acute left elbow pain following fall yesterday. Initial encounter. EXAM: LEFT ELBOW - COMPLETE 3+ VIEW COMPARISON:  None. FINDINGS: A nondisplaced radial neck fracture is noted, age indeterminate but may be acute. No other fracture, subluxation or dislocation identified. No other bony abnormalities are noted. IMPRESSION: Nondisplaced radial neck fracture -age indeterminate but may be acute. Correlate with pain. Electronically Signed   By: Margarette Canada M.D.   On: 07/22/2016 18:53   Dg Spine Portable 1 View  Result Date: 07/26/2016 CLINICAL DATA:  L4-L5 and L5-S1 lumbar decompression EXAM: PORTABLE SPINE - 1 VIEW COMPARISON:  Cross-table lateral view #4 at 1328 hours compared to 1248 hours. FINDINGS: Exam labeled the same as the earlier studies of 07/26/2016 as well as preoperative images of 07/18/2016. Metallic probes via dorsal approach project dorsal to the L4-L5 and L5-S1 disc space levels. Bones appear demineralized. Scattered degenerative disc and facet disease changes. Aortic atherosclerosis. IMPRESSION: Dorsal localization of the L4-L5 and L5-S1 disc space levels. Electronically Signed   By: Lavonia Dana M.D.   On: 07/26/2016 13:56   Dg  Spine Portable 1 View  Result Date: 07/26/2016 CLINICAL DATA:  Intraoperative film 3. L4-5 and L5-S1 lumbar decompression. EXAM: PORTABLE SPINE - 1 VIEW COMPARISON:  07/26/2016 FINDINGS: Posterior retractor is in place. Intraoperative localizing devices are present. The more superior overlies the spinous process of L3. The more inferior is posterior to the L4 vertebral body. IMPRESSION: Intraoperative localization. Electronically Signed   By: Nolon Nations M.D.   On: 07/26/2016 13:31   Dg Spine Portable 1 View  Result Date: 07/26/2016 CLINICAL DATA:  Lumbar decompression. EXAM: PORTABLE SPINE - 1 VIEW COMPARISON:  Radiographs of same day and Jul 18, 2016. FINDINGS: Single intraoperative cross-table lateral projection of lumbar spine was obtained. 1 surgical probe is seen projected over posterior spinous process of L4. Another probe is seen projected over posterior spinous process of S1. IMPRESSION: Surgical localization as described above. Electronically Signed   By: Marijo Conception, M.D.   On: 07/26/2016 13:01   Dg Spine Portable 1 View  Result Date: 07/26/2016 CLINICAL DATA:  Lumbar decompression L4-5 and L5-S1 EXAM: PORTABLE SPINE - 1 VIEW COMPARISON:  Lumbar spine radiographs 07/18/2016 FINDINGS: Single lateral lumbar spine in the operating room for localization purposes. As noted previously, S1 is partially lumbarized. Localizing needles overlying the L5 spinous process and overlying the S1 spinous process. There is marked disc degeneration at L4-5 and L5-S1 IMPRESSION: Spinous processes L5 and S1 localized in the operating room. Electronically Signed   By: Franchot Gallo M.D.   On: 07/26/2016 12:50    EKG: Orders placed or performed during the hospital encounter of 07/18/16  . EKG  . EKG     Hospital Course: Shannon Blair is a 83 y.o. who was admitted to [facility]. They were brought to the operating room on 07/26/2016 and underwent Procedure(s): Central decompression, lumbar  laminectomy L4-L5 and L5-S1, foraminotmy L5-S1 and L4-L5 bilateral.  Patient tolerated the procedure well and was later transferred to the recovery room and then to the orthopaedic floor for postoperative care.  They were given PO and IV analgesics for pain control following their surgery.  They were given 24 hours of postoperative antibiotics of  Anti-infectives    Start     Dose/Rate Route Frequency Ordered Stop   07/26/16 2000  ceFAZolin (ANCEF) IVPB 1 g/50 mL premix     1 g 100 mL/hr over 30 Minutes Intravenous Every 8 hours 07/26/16 1548 07/27/16 1454   07/26/16 1247  polymyxin B 500,000 Units, bacitracin 50,000 Units in sodium chloride irrigation 0.9 % 500 mL irrigation  Status:  Discontinued       As needed 07/26/16 1247 07/26/16 1436   07/26/16 0920  ceFAZolin (ANCEF) IVPB 2g/100 mL premix     2 g 200 mL/hr over 30 Minutes Intravenous On call to O.R. 07/26/16 0920 07/26/16 1243     and started on DVT prophylaxis in the form of Aspirin.   PT was ordered. Discharge planning consulted to help with postop disposition and equipment needs.  Patient had a good night on the evening of surgery.   Dressing was changed and the incision was clean and dry.  Patient was seen in rounds and was ready to go home.   Diet: Cardiac diet Activity:WBAT Follow-up:in 2 weeks Disposition - Home Discharged Condition: stable   Discharge Instructions    Call MD / Call 911    Complete by:  As directed    If you experience chest pain or shortness of breath, CALL 911 and be transported to the hospital emergency room.  If you develope a fever above 101 F, pus (white drainage) or increased drainage or redness at the wound, or calf pain, call your surgeon's office.   Constipation Prevention    Complete by:  As directed    Drink plenty of fluids.  Prune juice may be helpful.  You may use a stool softener, such as Colace (over the counter) 100 mg twice a day.  Use MiraLax (over the counter) for constipation as  needed.   Diet - low sodium heart healthy    Complete by:  As directed    Discharge instructions    Complete by:  As directed    For the first three days, remove your dressing, tape a piece of saran wrap over your incision Take your shower, then remove the saran wrap and put a clean dressing on, then reapply your sling. After three days you  can shower without the saran wrap.  Use cane to walk instead of walker since the left arm cannot support weight. No lifting or bending. Call Dr. Gladstone Lighter if any wound complications or temperature of 101 degrees F or over.  Call the office for an appointment to see Dr. Gladstone Lighter in two weeks: [phone]   Increase activity slowly as tolerated    Complete by:  As directed      Allergies as of 07/27/2016   No Known Allergies     Medication List    TAKE these medications   acetaminophen 325 MG tablet Commonly known as:  TYLENOL Take 650 mg by mouth every 6 (six) hours as needed for mild pain.   aspirin EC 81 MG tablet Take 81 mg by mouth daily.   calcium carbonate 600 MG Tabs tablet Commonly known as:  OS-CAL Take 600 mg by mouth 2 (two) times daily with a meal.   cephALEXin 500 MG capsule Commonly known as:  KEFLEX Take 500 mg by mouth 4 (four) times daily. Notes to patient:  Home regimen   donepezil 10 MG tablet Commonly known as:  ARICEPT Take 10 mg by mouth at bedtime.   Fish Oil 1000 MG Caps Take 1 capsule by mouth daily. Notes to patient:  Home regimen   HYDROcodone-acetaminophen 5-325 MG tablet Commonly known as:  NORCO/VICODIN Take 1 tablet by mouth every 6 (six) hours as needed (breakthrough pain).   irbesartan 300 MG tablet Commonly known as:  AVAPRO Take 300 mg by mouth daily.   memantine 10 MG tablet Commonly known as:  NAMENDA Take 10 mg by mouth 2 (two) times daily.   methocarbamol 500 MG tablet Commonly known as:  ROBAXIN Take 1 tablet (500 mg total) by mouth every 6 (six) hours as needed for muscle spasms.     multivitamin with minerals Tabs tablet Take 1 tablet by mouth daily.   naproxen sodium 220 MG tablet Commonly known as:  ANAPROX Take 440 mg by mouth 2 (two) times daily as needed (for pain).   OSTEO BI-FLEX ADV JOINT SHIELD PO Take 1 tablet by mouth daily.   oxybutynin 5 MG tablet Commonly known as:  DITROPAN Take 5 mg by mouth 2 (two) times daily.   simvastatin 40 MG tablet Commonly known as:  ZOCOR Take 40 mg by mouth daily at 6 PM.   venlafaxine 37.5 MG tablet Commonly known as:  EFFEXOR Take 37.5 mg by mouth 2 (two) times daily.   vitamin C 500 MG tablet Commonly known as:  ASCORBIC ACID Take 500 mg by mouth daily.   vitamin E 200 UNIT capsule Take 200 Units by mouth 3 (three) times daily.      Follow-up Information    Latanya Maudlin, MD. Schedule an appointment as soon as possible for a visit in 2 week(s).   Specialty:  Orthopedic Surgery Contact information: [address] [place] [address] [phone]           Signed: Ardeen Jourdain, PA-C Orthopaedic Surgery 07/31/2016, 10:35 AM

## 2016-08-04 DIAGNOSIS — I10 Essential (primary) hypertension: Secondary | ICD-10-CM | POA: Diagnosis not present

## 2016-08-04 DIAGNOSIS — M1990 Unspecified osteoarthritis, unspecified site: Secondary | ICD-10-CM | POA: Diagnosis not present

## 2016-08-04 DIAGNOSIS — Z4789 Encounter for other orthopedic aftercare: Secondary | ICD-10-CM | POA: Diagnosis not present

## 2016-08-04 DIAGNOSIS — M48062 Spinal stenosis, lumbar region with neurogenic claudication: Secondary | ICD-10-CM | POA: Diagnosis not present

## 2016-08-04 DIAGNOSIS — S52302D Unspecified fracture of shaft of left radius, subsequent encounter for closed fracture with routine healing: Secondary | ICD-10-CM | POA: Diagnosis not present

## 2016-08-04 DIAGNOSIS — W19XXXD Unspecified fall, subsequent encounter: Secondary | ICD-10-CM | POA: Diagnosis not present

## 2016-08-05 DIAGNOSIS — Z4789 Encounter for other orthopedic aftercare: Secondary | ICD-10-CM | POA: Diagnosis not present

## 2016-08-05 DIAGNOSIS — M1990 Unspecified osteoarthritis, unspecified site: Secondary | ICD-10-CM | POA: Diagnosis not present

## 2016-08-05 DIAGNOSIS — W19XXXD Unspecified fall, subsequent encounter: Secondary | ICD-10-CM | POA: Diagnosis not present

## 2016-08-05 DIAGNOSIS — I10 Essential (primary) hypertension: Secondary | ICD-10-CM | POA: Diagnosis not present

## 2016-08-05 DIAGNOSIS — M48062 Spinal stenosis, lumbar region with neurogenic claudication: Secondary | ICD-10-CM | POA: Diagnosis not present

## 2016-08-05 DIAGNOSIS — S52302D Unspecified fracture of shaft of left radius, subsequent encounter for closed fracture with routine healing: Secondary | ICD-10-CM | POA: Diagnosis not present

## 2016-08-08 DIAGNOSIS — M48062 Spinal stenosis, lumbar region with neurogenic claudication: Secondary | ICD-10-CM | POA: Diagnosis not present

## 2016-08-08 DIAGNOSIS — Z4789 Encounter for other orthopedic aftercare: Secondary | ICD-10-CM | POA: Diagnosis not present

## 2016-08-08 DIAGNOSIS — W19XXXD Unspecified fall, subsequent encounter: Secondary | ICD-10-CM | POA: Diagnosis not present

## 2016-08-08 DIAGNOSIS — I10 Essential (primary) hypertension: Secondary | ICD-10-CM | POA: Diagnosis not present

## 2016-08-08 DIAGNOSIS — S52135A Nondisplaced fracture of neck of left radius, initial encounter for closed fracture: Secondary | ICD-10-CM | POA: Diagnosis not present

## 2016-08-08 DIAGNOSIS — S52302D Unspecified fracture of shaft of left radius, subsequent encounter for closed fracture with routine healing: Secondary | ICD-10-CM | POA: Diagnosis not present

## 2016-08-08 DIAGNOSIS — M1990 Unspecified osteoarthritis, unspecified site: Secondary | ICD-10-CM | POA: Diagnosis not present

## 2016-08-10 DIAGNOSIS — Z4789 Encounter for other orthopedic aftercare: Secondary | ICD-10-CM | POA: Diagnosis not present

## 2016-08-10 DIAGNOSIS — I10 Essential (primary) hypertension: Secondary | ICD-10-CM | POA: Diagnosis not present

## 2016-08-10 DIAGNOSIS — M48062 Spinal stenosis, lumbar region with neurogenic claudication: Secondary | ICD-10-CM | POA: Diagnosis not present

## 2016-08-10 DIAGNOSIS — W19XXXD Unspecified fall, subsequent encounter: Secondary | ICD-10-CM | POA: Diagnosis not present

## 2016-08-10 DIAGNOSIS — M1990 Unspecified osteoarthritis, unspecified site: Secondary | ICD-10-CM | POA: Diagnosis not present

## 2016-08-10 DIAGNOSIS — S52302D Unspecified fracture of shaft of left radius, subsequent encounter for closed fracture with routine healing: Secondary | ICD-10-CM | POA: Diagnosis not present

## 2016-08-15 DIAGNOSIS — I10 Essential (primary) hypertension: Secondary | ICD-10-CM | POA: Diagnosis not present

## 2016-08-15 DIAGNOSIS — F0390 Unspecified dementia without behavioral disturbance: Secondary | ICD-10-CM | POA: Diagnosis not present

## 2016-08-15 DIAGNOSIS — M5136 Other intervertebral disc degeneration, lumbar region: Secondary | ICD-10-CM | POA: Diagnosis not present

## 2016-08-15 DIAGNOSIS — M150 Primary generalized (osteo)arthritis: Secondary | ICD-10-CM | POA: Diagnosis not present

## 2016-08-17 DIAGNOSIS — M1990 Unspecified osteoarthritis, unspecified site: Secondary | ICD-10-CM | POA: Diagnosis not present

## 2016-08-17 DIAGNOSIS — M48062 Spinal stenosis, lumbar region with neurogenic claudication: Secondary | ICD-10-CM | POA: Diagnosis not present

## 2016-08-17 DIAGNOSIS — I10 Essential (primary) hypertension: Secondary | ICD-10-CM | POA: Diagnosis not present

## 2016-08-17 DIAGNOSIS — W19XXXD Unspecified fall, subsequent encounter: Secondary | ICD-10-CM | POA: Diagnosis not present

## 2016-08-17 DIAGNOSIS — S52302D Unspecified fracture of shaft of left radius, subsequent encounter for closed fracture with routine healing: Secondary | ICD-10-CM | POA: Diagnosis not present

## 2016-08-17 DIAGNOSIS — Z4789 Encounter for other orthopedic aftercare: Secondary | ICD-10-CM | POA: Diagnosis not present

## 2016-08-18 DIAGNOSIS — W19XXXD Unspecified fall, subsequent encounter: Secondary | ICD-10-CM | POA: Diagnosis not present

## 2016-08-18 DIAGNOSIS — I10 Essential (primary) hypertension: Secondary | ICD-10-CM | POA: Diagnosis not present

## 2016-08-18 DIAGNOSIS — M48062 Spinal stenosis, lumbar region with neurogenic claudication: Secondary | ICD-10-CM | POA: Diagnosis not present

## 2016-08-18 DIAGNOSIS — M1990 Unspecified osteoarthritis, unspecified site: Secondary | ICD-10-CM | POA: Diagnosis not present

## 2016-08-18 DIAGNOSIS — Z4789 Encounter for other orthopedic aftercare: Secondary | ICD-10-CM | POA: Diagnosis not present

## 2016-08-18 DIAGNOSIS — S52302D Unspecified fracture of shaft of left radius, subsequent encounter for closed fracture with routine healing: Secondary | ICD-10-CM | POA: Diagnosis not present

## 2016-08-22 DIAGNOSIS — I10 Essential (primary) hypertension: Secondary | ICD-10-CM | POA: Diagnosis not present

## 2016-08-22 DIAGNOSIS — M1990 Unspecified osteoarthritis, unspecified site: Secondary | ICD-10-CM | POA: Diagnosis not present

## 2016-08-22 DIAGNOSIS — W19XXXD Unspecified fall, subsequent encounter: Secondary | ICD-10-CM | POA: Diagnosis not present

## 2016-08-22 DIAGNOSIS — M48062 Spinal stenosis, lumbar region with neurogenic claudication: Secondary | ICD-10-CM | POA: Diagnosis not present

## 2016-08-22 DIAGNOSIS — S52302D Unspecified fracture of shaft of left radius, subsequent encounter for closed fracture with routine healing: Secondary | ICD-10-CM | POA: Diagnosis not present

## 2016-08-22 DIAGNOSIS — Z4789 Encounter for other orthopedic aftercare: Secondary | ICD-10-CM | POA: Diagnosis not present

## 2016-08-23 DIAGNOSIS — M961 Postlaminectomy syndrome, not elsewhere classified: Secondary | ICD-10-CM | POA: Diagnosis not present

## 2016-08-23 DIAGNOSIS — M6281 Muscle weakness (generalized): Secondary | ICD-10-CM | POA: Diagnosis not present

## 2016-08-23 DIAGNOSIS — R2689 Other abnormalities of gait and mobility: Secondary | ICD-10-CM | POA: Diagnosis not present

## 2016-08-23 DIAGNOSIS — R278 Other lack of coordination: Secondary | ICD-10-CM | POA: Diagnosis not present

## 2016-08-23 DIAGNOSIS — R41841 Cognitive communication deficit: Secondary | ICD-10-CM | POA: Diagnosis not present

## 2016-08-23 DIAGNOSIS — S52135D Nondisplaced fracture of neck of left radius, subsequent encounter for closed fracture with routine healing: Secondary | ICD-10-CM | POA: Diagnosis not present

## 2016-08-23 DIAGNOSIS — M4807 Spinal stenosis, lumbosacral region: Secondary | ICD-10-CM | POA: Diagnosis not present

## 2016-08-23 DIAGNOSIS — R2681 Unsteadiness on feet: Secondary | ICD-10-CM | POA: Diagnosis not present

## 2016-08-23 DIAGNOSIS — Z4789 Encounter for other orthopedic aftercare: Secondary | ICD-10-CM | POA: Diagnosis not present

## 2016-08-23 DIAGNOSIS — Z9181 History of falling: Secondary | ICD-10-CM | POA: Diagnosis not present

## 2016-08-29 DIAGNOSIS — S52135D Nondisplaced fracture of neck of left radius, subsequent encounter for closed fracture with routine healing: Secondary | ICD-10-CM | POA: Diagnosis not present

## 2016-09-04 DIAGNOSIS — Z9181 History of falling: Secondary | ICD-10-CM | POA: Diagnosis not present

## 2016-09-04 DIAGNOSIS — R2689 Other abnormalities of gait and mobility: Secondary | ICD-10-CM | POA: Diagnosis not present

## 2016-09-15 DIAGNOSIS — W19XXXD Unspecified fall, subsequent encounter: Secondary | ICD-10-CM | POA: Diagnosis not present

## 2016-09-15 DIAGNOSIS — Z4789 Encounter for other orthopedic aftercare: Secondary | ICD-10-CM | POA: Diagnosis not present

## 2016-09-15 DIAGNOSIS — M48062 Spinal stenosis, lumbar region with neurogenic claudication: Secondary | ICD-10-CM | POA: Diagnosis not present

## 2016-09-15 DIAGNOSIS — S52302D Unspecified fracture of shaft of left radius, subsequent encounter for closed fracture with routine healing: Secondary | ICD-10-CM | POA: Diagnosis not present

## 2016-09-15 DIAGNOSIS — I10 Essential (primary) hypertension: Secondary | ICD-10-CM | POA: Diagnosis not present

## 2016-09-15 DIAGNOSIS — M1990 Unspecified osteoarthritis, unspecified site: Secondary | ICD-10-CM | POA: Diagnosis not present

## 2016-09-22 DIAGNOSIS — S52302D Unspecified fracture of shaft of left radius, subsequent encounter for closed fracture with routine healing: Secondary | ICD-10-CM | POA: Diagnosis not present

## 2016-09-22 DIAGNOSIS — M1990 Unspecified osteoarthritis, unspecified site: Secondary | ICD-10-CM | POA: Diagnosis not present

## 2016-09-22 DIAGNOSIS — Z4789 Encounter for other orthopedic aftercare: Secondary | ICD-10-CM | POA: Diagnosis not present

## 2016-09-22 DIAGNOSIS — M48062 Spinal stenosis, lumbar region with neurogenic claudication: Secondary | ICD-10-CM | POA: Diagnosis not present

## 2016-09-22 DIAGNOSIS — I10 Essential (primary) hypertension: Secondary | ICD-10-CM | POA: Diagnosis not present

## 2016-09-22 DIAGNOSIS — W19XXXD Unspecified fall, subsequent encounter: Secondary | ICD-10-CM | POA: Diagnosis not present

## 2016-09-25 DIAGNOSIS — M48062 Spinal stenosis, lumbar region with neurogenic claudication: Secondary | ICD-10-CM | POA: Diagnosis not present

## 2016-09-25 DIAGNOSIS — W19XXXD Unspecified fall, subsequent encounter: Secondary | ICD-10-CM | POA: Diagnosis not present

## 2016-09-25 DIAGNOSIS — M1990 Unspecified osteoarthritis, unspecified site: Secondary | ICD-10-CM | POA: Diagnosis not present

## 2016-09-25 DIAGNOSIS — I10 Essential (primary) hypertension: Secondary | ICD-10-CM | POA: Diagnosis not present

## 2016-09-25 DIAGNOSIS — S52302D Unspecified fracture of shaft of left radius, subsequent encounter for closed fracture with routine healing: Secondary | ICD-10-CM | POA: Diagnosis not present

## 2016-09-25 DIAGNOSIS — Z4789 Encounter for other orthopedic aftercare: Secondary | ICD-10-CM | POA: Diagnosis not present

## 2016-10-17 DIAGNOSIS — E782 Mixed hyperlipidemia: Secondary | ICD-10-CM | POA: Diagnosis not present

## 2016-10-17 DIAGNOSIS — I10 Essential (primary) hypertension: Secondary | ICD-10-CM | POA: Diagnosis not present

## 2016-10-24 DIAGNOSIS — M159 Polyosteoarthritis, unspecified: Secondary | ICD-10-CM | POA: Diagnosis not present

## 2016-10-24 DIAGNOSIS — I10 Essential (primary) hypertension: Secondary | ICD-10-CM | POA: Diagnosis not present

## 2016-10-24 DIAGNOSIS — E782 Mixed hyperlipidemia: Secondary | ICD-10-CM | POA: Diagnosis not present

## 2016-10-24 DIAGNOSIS — R739 Hyperglycemia, unspecified: Secondary | ICD-10-CM | POA: Diagnosis not present

## 2016-11-07 DIAGNOSIS — H35033 Hypertensive retinopathy, bilateral: Secondary | ICD-10-CM | POA: Diagnosis not present

## 2016-11-07 DIAGNOSIS — H34832 Tributary (branch) retinal vein occlusion, left eye, with macular edema: Secondary | ICD-10-CM | POA: Diagnosis not present

## 2016-11-07 DIAGNOSIS — H2513 Age-related nuclear cataract, bilateral: Secondary | ICD-10-CM | POA: Diagnosis not present

## 2016-11-13 DIAGNOSIS — J209 Acute bronchitis, unspecified: Secondary | ICD-10-CM | POA: Diagnosis not present

## 2016-11-30 DIAGNOSIS — G8929 Other chronic pain: Secondary | ICD-10-CM | POA: Diagnosis not present

## 2016-11-30 DIAGNOSIS — M25562 Pain in left knee: Secondary | ICD-10-CM | POA: Diagnosis not present

## 2016-11-30 DIAGNOSIS — M5442 Lumbago with sciatica, left side: Secondary | ICD-10-CM | POA: Diagnosis not present

## 2016-11-30 DIAGNOSIS — M5441 Lumbago with sciatica, right side: Secondary | ICD-10-CM | POA: Diagnosis not present

## 2016-11-30 DIAGNOSIS — Z4789 Encounter for other orthopedic aftercare: Secondary | ICD-10-CM | POA: Diagnosis not present

## 2016-12-06 DIAGNOSIS — M5441 Lumbago with sciatica, right side: Secondary | ICD-10-CM | POA: Diagnosis not present

## 2016-12-06 DIAGNOSIS — M5442 Lumbago with sciatica, left side: Secondary | ICD-10-CM | POA: Diagnosis not present

## 2016-12-06 DIAGNOSIS — G8929 Other chronic pain: Secondary | ICD-10-CM | POA: Diagnosis not present

## 2016-12-11 DIAGNOSIS — M5442 Lumbago with sciatica, left side: Secondary | ICD-10-CM | POA: Diagnosis not present

## 2016-12-11 DIAGNOSIS — G8929 Other chronic pain: Secondary | ICD-10-CM | POA: Diagnosis not present

## 2016-12-11 DIAGNOSIS — M5441 Lumbago with sciatica, right side: Secondary | ICD-10-CM | POA: Diagnosis not present

## 2016-12-14 DIAGNOSIS — M5442 Lumbago with sciatica, left side: Secondary | ICD-10-CM | POA: Diagnosis not present

## 2016-12-14 DIAGNOSIS — G8929 Other chronic pain: Secondary | ICD-10-CM | POA: Diagnosis not present

## 2016-12-14 DIAGNOSIS — M5441 Lumbago with sciatica, right side: Secondary | ICD-10-CM | POA: Diagnosis not present

## 2016-12-18 DIAGNOSIS — M5442 Lumbago with sciatica, left side: Secondary | ICD-10-CM | POA: Diagnosis not present

## 2016-12-18 DIAGNOSIS — G8929 Other chronic pain: Secondary | ICD-10-CM | POA: Diagnosis not present

## 2016-12-18 DIAGNOSIS — M5441 Lumbago with sciatica, right side: Secondary | ICD-10-CM | POA: Diagnosis not present

## 2016-12-21 DIAGNOSIS — G8929 Other chronic pain: Secondary | ICD-10-CM | POA: Diagnosis not present

## 2016-12-21 DIAGNOSIS — M5441 Lumbago with sciatica, right side: Secondary | ICD-10-CM | POA: Diagnosis not present

## 2016-12-21 DIAGNOSIS — M5442 Lumbago with sciatica, left side: Secondary | ICD-10-CM | POA: Diagnosis not present

## 2016-12-26 DIAGNOSIS — G4700 Insomnia, unspecified: Secondary | ICD-10-CM | POA: Diagnosis not present

## 2016-12-26 DIAGNOSIS — R5383 Other fatigue: Secondary | ICD-10-CM | POA: Diagnosis not present

## 2016-12-26 DIAGNOSIS — Z23 Encounter for immunization: Secondary | ICD-10-CM | POA: Diagnosis not present

## 2016-12-26 DIAGNOSIS — M79605 Pain in left leg: Secondary | ICD-10-CM | POA: Diagnosis not present

## 2016-12-26 DIAGNOSIS — R42 Dizziness and giddiness: Secondary | ICD-10-CM | POA: Diagnosis not present

## 2016-12-26 DIAGNOSIS — N390 Urinary tract infection, site not specified: Secondary | ICD-10-CM | POA: Diagnosis not present

## 2017-01-01 DIAGNOSIS — M5441 Lumbago with sciatica, right side: Secondary | ICD-10-CM | POA: Diagnosis not present

## 2017-01-01 DIAGNOSIS — M5442 Lumbago with sciatica, left side: Secondary | ICD-10-CM | POA: Diagnosis not present

## 2017-01-01 DIAGNOSIS — G8929 Other chronic pain: Secondary | ICD-10-CM | POA: Diagnosis not present

## 2017-01-08 DIAGNOSIS — M5441 Lumbago with sciatica, right side: Secondary | ICD-10-CM | POA: Diagnosis not present

## 2017-01-08 DIAGNOSIS — M5442 Lumbago with sciatica, left side: Secondary | ICD-10-CM | POA: Diagnosis not present

## 2017-01-08 DIAGNOSIS — G8929 Other chronic pain: Secondary | ICD-10-CM | POA: Diagnosis not present

## 2017-01-11 DIAGNOSIS — M5441 Lumbago with sciatica, right side: Secondary | ICD-10-CM | POA: Diagnosis not present

## 2017-01-11 DIAGNOSIS — G8929 Other chronic pain: Secondary | ICD-10-CM | POA: Diagnosis not present

## 2017-01-11 DIAGNOSIS — M5442 Lumbago with sciatica, left side: Secondary | ICD-10-CM | POA: Diagnosis not present

## 2017-01-15 DIAGNOSIS — N390 Urinary tract infection, site not specified: Secondary | ICD-10-CM | POA: Diagnosis not present

## 2017-02-05 DIAGNOSIS — G629 Polyneuropathy, unspecified: Secondary | ICD-10-CM | POA: Diagnosis not present

## 2017-02-12 DIAGNOSIS — M5441 Lumbago with sciatica, right side: Secondary | ICD-10-CM | POA: Diagnosis not present

## 2017-02-12 DIAGNOSIS — M5442 Lumbago with sciatica, left side: Secondary | ICD-10-CM | POA: Diagnosis not present

## 2017-02-12 DIAGNOSIS — G8929 Other chronic pain: Secondary | ICD-10-CM | POA: Diagnosis not present

## 2017-02-20 DIAGNOSIS — N390 Urinary tract infection, site not specified: Secondary | ICD-10-CM | POA: Diagnosis not present

## 2017-02-20 DIAGNOSIS — E782 Mixed hyperlipidemia: Secondary | ICD-10-CM | POA: Diagnosis not present

## 2017-02-20 DIAGNOSIS — R739 Hyperglycemia, unspecified: Secondary | ICD-10-CM | POA: Diagnosis not present

## 2017-02-20 DIAGNOSIS — Z780 Asymptomatic menopausal state: Secondary | ICD-10-CM | POA: Diagnosis not present

## 2017-02-20 DIAGNOSIS — I10 Essential (primary) hypertension: Secondary | ICD-10-CM | POA: Diagnosis not present

## 2017-02-20 DIAGNOSIS — Z0000 Encounter for general adult medical examination without abnormal findings: Secondary | ICD-10-CM | POA: Diagnosis not present

## 2017-03-21 DIAGNOSIS — N3281 Overactive bladder: Secondary | ICD-10-CM | POA: Diagnosis not present

## 2017-03-21 DIAGNOSIS — R739 Hyperglycemia, unspecified: Secondary | ICD-10-CM | POA: Diagnosis not present

## 2017-03-21 DIAGNOSIS — R9431 Abnormal electrocardiogram [ECG] [EKG]: Secondary | ICD-10-CM | POA: Diagnosis not present

## 2017-03-21 DIAGNOSIS — E782 Mixed hyperlipidemia: Secondary | ICD-10-CM | POA: Diagnosis not present

## 2017-03-21 DIAGNOSIS — M150 Primary generalized (osteo)arthritis: Secondary | ICD-10-CM | POA: Diagnosis not present

## 2017-03-21 DIAGNOSIS — Z23 Encounter for immunization: Secondary | ICD-10-CM | POA: Diagnosis not present

## 2017-03-21 DIAGNOSIS — F0390 Unspecified dementia without behavioral disturbance: Secondary | ICD-10-CM | POA: Diagnosis not present

## 2017-03-21 DIAGNOSIS — N3946 Mixed incontinence: Secondary | ICD-10-CM | POA: Diagnosis not present

## 2017-03-21 DIAGNOSIS — I10 Essential (primary) hypertension: Secondary | ICD-10-CM | POA: Diagnosis not present

## 2017-04-10 DIAGNOSIS — N390 Urinary tract infection, site not specified: Secondary | ICD-10-CM | POA: Diagnosis not present

## 2017-08-16 DIAGNOSIS — N390 Urinary tract infection, site not specified: Secondary | ICD-10-CM | POA: Diagnosis not present

## 2017-08-16 DIAGNOSIS — R350 Frequency of micturition: Secondary | ICD-10-CM | POA: Diagnosis not present

## 2017-09-05 DIAGNOSIS — F0390 Unspecified dementia without behavioral disturbance: Secondary | ICD-10-CM | POA: Diagnosis not present

## 2017-09-05 DIAGNOSIS — I10 Essential (primary) hypertension: Secondary | ICD-10-CM | POA: Diagnosis not present

## 2017-09-05 DIAGNOSIS — E782 Mixed hyperlipidemia: Secondary | ICD-10-CM | POA: Diagnosis not present

## 2017-09-05 DIAGNOSIS — N390 Urinary tract infection, site not specified: Secondary | ICD-10-CM | POA: Diagnosis not present

## 2017-09-11 DIAGNOSIS — H34832 Tributary (branch) retinal vein occlusion, left eye, with macular edema: Secondary | ICD-10-CM | POA: Diagnosis not present

## 2017-09-11 DIAGNOSIS — H35033 Hypertensive retinopathy, bilateral: Secondary | ICD-10-CM | POA: Diagnosis not present

## 2017-09-11 DIAGNOSIS — H2513 Age-related nuclear cataract, bilateral: Secondary | ICD-10-CM | POA: Diagnosis not present

## 2017-09-12 DIAGNOSIS — F0390 Unspecified dementia without behavioral disturbance: Secondary | ICD-10-CM | POA: Diagnosis not present

## 2017-09-12 DIAGNOSIS — I10 Essential (primary) hypertension: Secondary | ICD-10-CM | POA: Diagnosis not present

## 2017-09-12 DIAGNOSIS — E782 Mixed hyperlipidemia: Secondary | ICD-10-CM | POA: Diagnosis not present

## 2017-09-12 DIAGNOSIS — R9431 Abnormal electrocardiogram [ECG] [EKG]: Secondary | ICD-10-CM | POA: Diagnosis not present

## 2017-09-12 DIAGNOSIS — R739 Hyperglycemia, unspecified: Secondary | ICD-10-CM | POA: Diagnosis not present

## 2017-09-19 DIAGNOSIS — H5213 Myopia, bilateral: Secondary | ICD-10-CM | POA: Diagnosis not present

## 2017-09-19 DIAGNOSIS — Z0100 Encounter for examination of eyes and vision without abnormal findings: Secondary | ICD-10-CM | POA: Diagnosis not present

## 2017-11-29 DIAGNOSIS — L718 Other rosacea: Secondary | ICD-10-CM | POA: Diagnosis not present

## 2017-11-29 DIAGNOSIS — L814 Other melanin hyperpigmentation: Secondary | ICD-10-CM | POA: Diagnosis not present

## 2017-11-29 DIAGNOSIS — L821 Other seborrheic keratosis: Secondary | ICD-10-CM | POA: Diagnosis not present

## 2017-11-29 DIAGNOSIS — D1801 Hemangioma of skin and subcutaneous tissue: Secondary | ICD-10-CM | POA: Diagnosis not present

## 2017-11-29 DIAGNOSIS — D225 Melanocytic nevi of trunk: Secondary | ICD-10-CM | POA: Diagnosis not present

## 2017-11-29 DIAGNOSIS — L570 Actinic keratosis: Secondary | ICD-10-CM | POA: Diagnosis not present

## 2018-01-24 DIAGNOSIS — L821 Other seborrheic keratosis: Secondary | ICD-10-CM | POA: Diagnosis not present

## 2018-01-24 DIAGNOSIS — L570 Actinic keratosis: Secondary | ICD-10-CM | POA: Diagnosis not present

## 2018-01-24 DIAGNOSIS — L718 Other rosacea: Secondary | ICD-10-CM | POA: Diagnosis not present

## 2018-04-10 DIAGNOSIS — E782 Mixed hyperlipidemia: Secondary | ICD-10-CM | POA: Diagnosis not present

## 2018-04-10 DIAGNOSIS — N390 Urinary tract infection, site not specified: Secondary | ICD-10-CM | POA: Diagnosis not present

## 2018-04-10 DIAGNOSIS — F0390 Unspecified dementia without behavioral disturbance: Secondary | ICD-10-CM | POA: Diagnosis not present

## 2018-04-10 DIAGNOSIS — R739 Hyperglycemia, unspecified: Secondary | ICD-10-CM | POA: Diagnosis not present

## 2018-04-10 DIAGNOSIS — I10 Essential (primary) hypertension: Secondary | ICD-10-CM | POA: Diagnosis not present

## 2018-04-10 DIAGNOSIS — R9431 Abnormal electrocardiogram [ECG] [EKG]: Secondary | ICD-10-CM | POA: Diagnosis not present

## 2018-04-10 DIAGNOSIS — Z0000 Encounter for general adult medical examination without abnormal findings: Secondary | ICD-10-CM | POA: Diagnosis not present

## 2018-04-17 DIAGNOSIS — R531 Weakness: Secondary | ICD-10-CM | POA: Diagnosis not present

## 2018-04-17 DIAGNOSIS — Z0000 Encounter for general adult medical examination without abnormal findings: Secondary | ICD-10-CM | POA: Diagnosis not present

## 2018-04-17 DIAGNOSIS — E782 Mixed hyperlipidemia: Secondary | ICD-10-CM | POA: Diagnosis not present

## 2018-04-17 DIAGNOSIS — I10 Essential (primary) hypertension: Secondary | ICD-10-CM | POA: Diagnosis not present

## 2018-04-17 DIAGNOSIS — R9431 Abnormal electrocardiogram [ECG] [EKG]: Secondary | ICD-10-CM | POA: Diagnosis not present

## 2018-04-17 DIAGNOSIS — F0390 Unspecified dementia without behavioral disturbance: Secondary | ICD-10-CM | POA: Diagnosis not present

## 2018-04-17 DIAGNOSIS — M5136 Other intervertebral disc degeneration, lumbar region: Secondary | ICD-10-CM | POA: Diagnosis not present

## 2018-04-17 DIAGNOSIS — R739 Hyperglycemia, unspecified: Secondary | ICD-10-CM | POA: Diagnosis not present

## 2018-05-05 IMAGING — DX DG ELBOW COMPLETE 3+V*L*
4 series · 4 of 4 positions shown · non-contrast
Comparison: None.

CLINICAL DATA: 84-year-old female with acute left elbow pain
following fall yesterday. Initial encounter.

EXAM:
LEFT ELBOW - COMPLETE 3+ VIEW

[elbow ap]
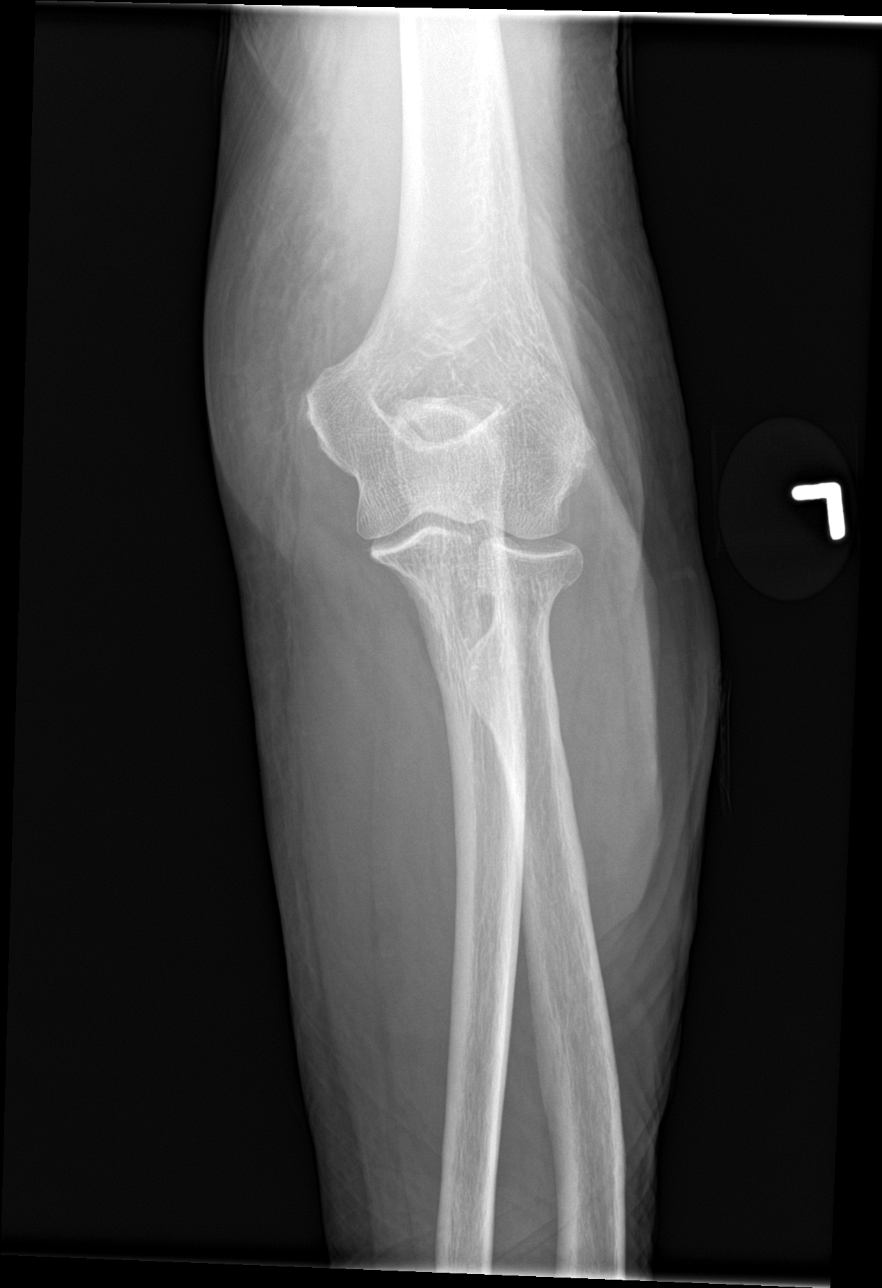

[elbow obl (1 of 2)]
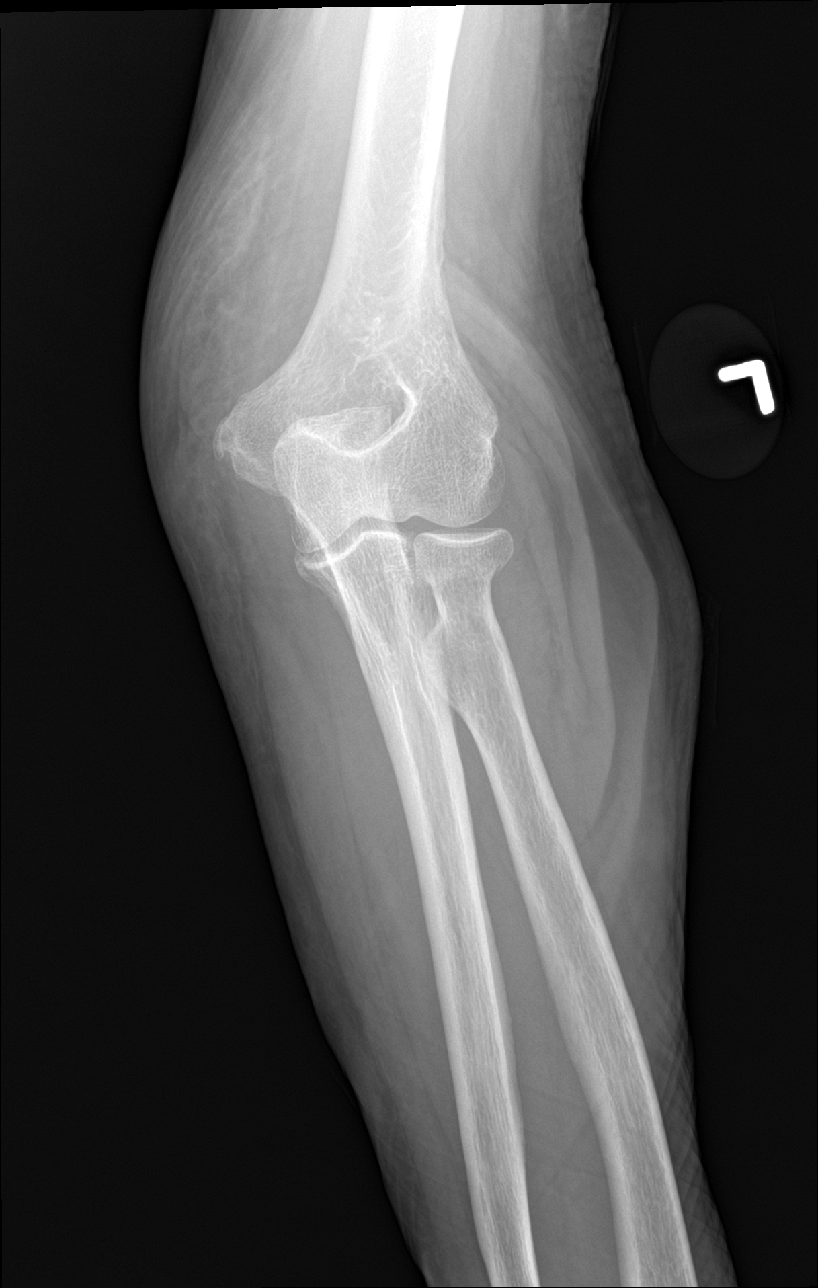

[elbow obl (2 of 2)]
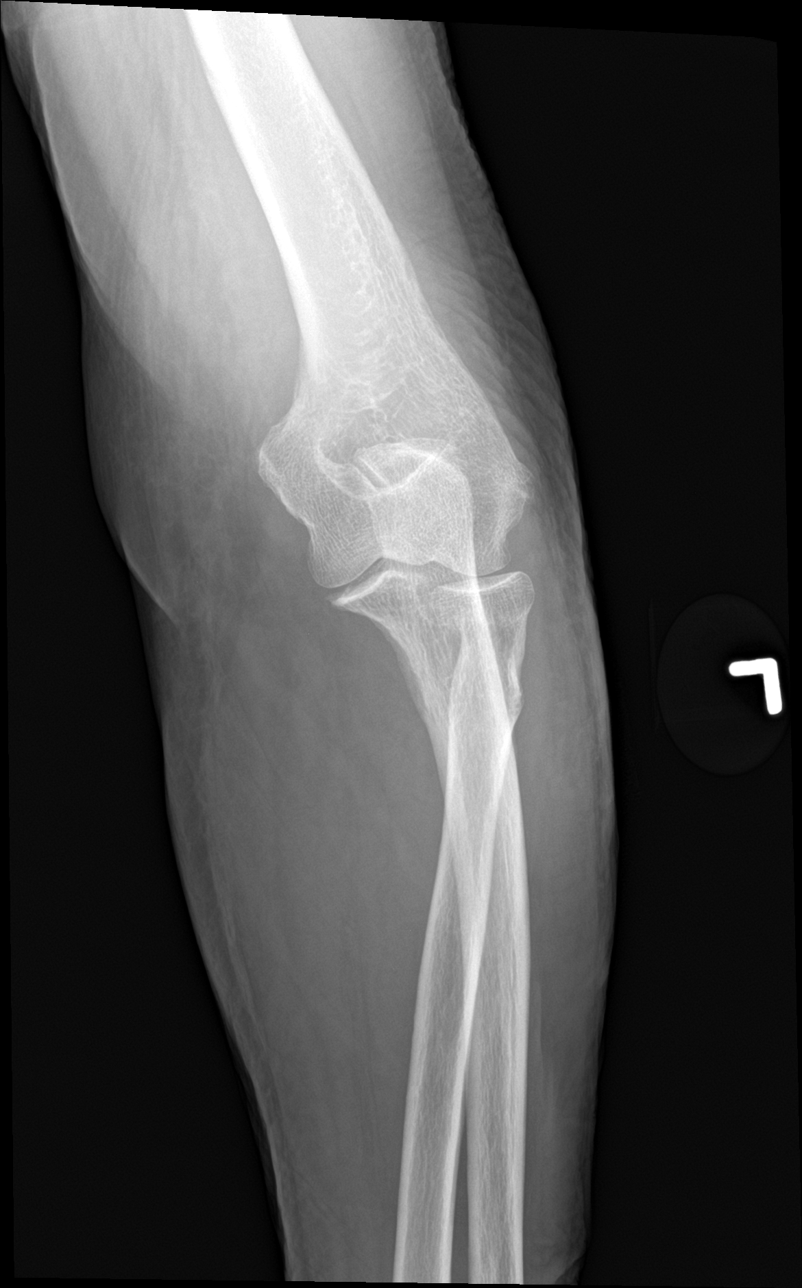

[elbow lat]
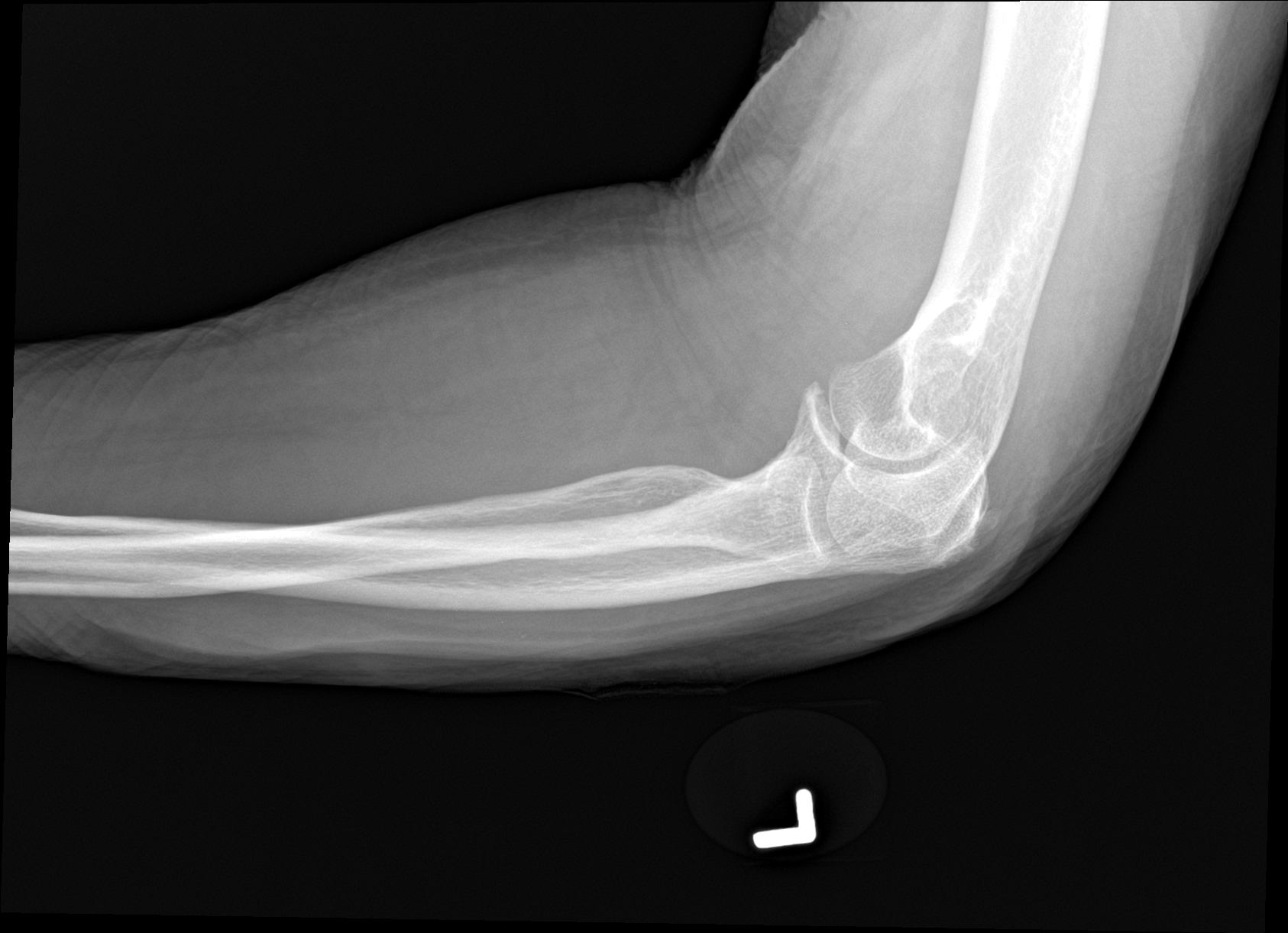

[4 of 4 positions shown; findings below may reference images not displayed]

FINDINGS: A nondisplaced radial neck fracture is noted, age indeterminate but
may be acute.

No other fracture, subluxation or dislocation identified.

No other bony abnormalities are noted.
IMPRESSION: Nondisplaced radial neck fracture -age indeterminate but may be
acute. Correlate with pain.

## 2018-05-09 IMAGING — DX DG SPINE 1V PORT
1 series · 1 of 1 positions shown · non-contrast
Comparison: Radiographs of same day and July 18, 2016.

CLINICAL DATA: Lumbar decompression.

EXAM:
PORTABLE SPINE - 1 VIEW

[l-spine lat]
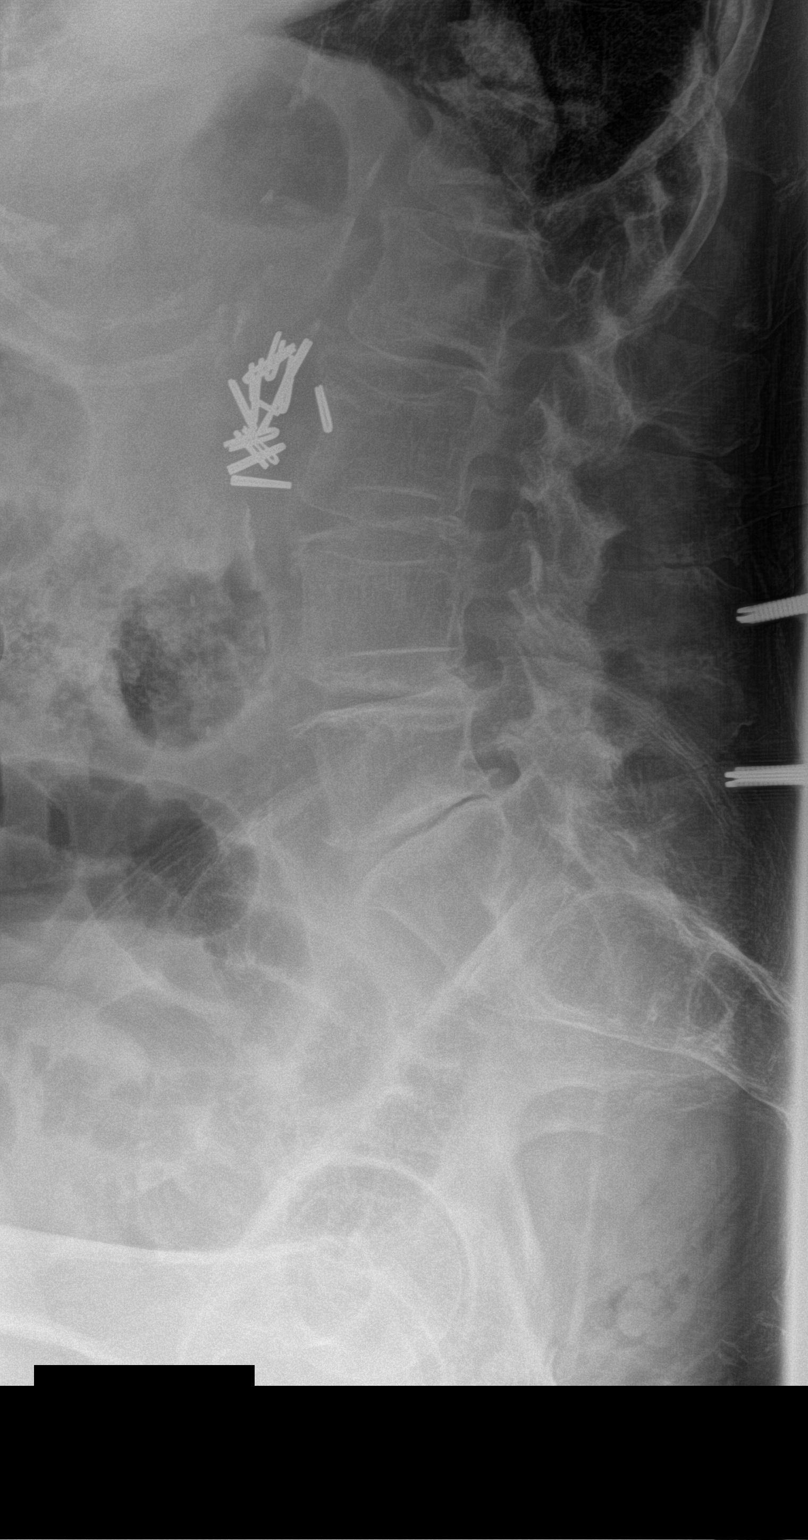

[1 of 1 positions shown; findings below may reference images not displayed]

FINDINGS: Single intraoperative cross-table lateral projection of lumbar spine
was obtained. 1 surgical probe is seen projected over posterior
spinous process of L4. Another probe is seen projected over
posterior spinous process of S1.
IMPRESSION: Surgical localization as described above.

## 2018-07-02 DIAGNOSIS — N390 Urinary tract infection, site not specified: Secondary | ICD-10-CM | POA: Diagnosis not present

## 2018-07-02 DIAGNOSIS — R300 Dysuria: Secondary | ICD-10-CM | POA: Diagnosis not present

## 2018-07-05 ENCOUNTER — Other Ambulatory Visit: Payer: Self-pay

## 2018-07-05 ENCOUNTER — Inpatient Hospital Stay (HOSPITAL_COMMUNITY)
Admission: EM | Admit: 2018-07-05 | Discharge: 2018-07-08 | DRG: 682 | Disposition: A | Discharge status: Home Health | Payer: Medicare HMO | Attending: Internal Medicine | Admitting: Internal Medicine

## 2018-07-05 ENCOUNTER — Emergency Department (HOSPITAL_COMMUNITY): Payer: Medicare HMO

## 2018-07-05 DIAGNOSIS — Z1629 Resistance to other single specified antibiotic: Secondary | ICD-10-CM | POA: Diagnosis present

## 2018-07-05 DIAGNOSIS — Z86010 Personal history of colonic polyps: Secondary | ICD-10-CM | POA: Diagnosis not present

## 2018-07-05 DIAGNOSIS — Z85048 Personal history of other malignant neoplasm of rectum, rectosigmoid junction, and anus: Secondary | ICD-10-CM

## 2018-07-05 DIAGNOSIS — Z85828 Personal history of other malignant neoplasm of skin: Secondary | ICD-10-CM | POA: Diagnosis not present

## 2018-07-05 DIAGNOSIS — I10 Essential (primary) hypertension: Secondary | ICD-10-CM | POA: Diagnosis not present

## 2018-07-05 DIAGNOSIS — F0390 Unspecified dementia without behavioral disturbance: Secondary | ICD-10-CM | POA: Diagnosis present

## 2018-07-05 DIAGNOSIS — Z9221 Personal history of antineoplastic chemotherapy: Secondary | ICD-10-CM

## 2018-07-05 DIAGNOSIS — E860 Dehydration: Secondary | ICD-10-CM | POA: Diagnosis present

## 2018-07-05 DIAGNOSIS — N3000 Acute cystitis without hematuria: Secondary | ICD-10-CM | POA: Diagnosis present

## 2018-07-05 DIAGNOSIS — Z79899 Other long term (current) drug therapy: Secondary | ICD-10-CM | POA: Diagnosis not present

## 2018-07-05 DIAGNOSIS — N170 Acute kidney failure with tubular necrosis: Secondary | ICD-10-CM | POA: Diagnosis not present

## 2018-07-05 DIAGNOSIS — Z923 Personal history of irradiation: Secondary | ICD-10-CM | POA: Diagnosis not present

## 2018-07-05 DIAGNOSIS — G9341 Metabolic encephalopathy: Secondary | ICD-10-CM | POA: Diagnosis not present

## 2018-07-05 DIAGNOSIS — N390 Urinary tract infection, site not specified: Secondary | ICD-10-CM | POA: Diagnosis present

## 2018-07-05 DIAGNOSIS — R0902 Hypoxemia: Secondary | ICD-10-CM | POA: Diagnosis not present

## 2018-07-05 DIAGNOSIS — N179 Acute kidney failure, unspecified: Secondary | ICD-10-CM | POA: Diagnosis present

## 2018-07-05 DIAGNOSIS — R4182 Altered mental status, unspecified: Secondary | ICD-10-CM | POA: Diagnosis not present

## 2018-07-05 DIAGNOSIS — Z7982 Long term (current) use of aspirin: Secondary | ICD-10-CM

## 2018-07-05 DIAGNOSIS — B9620 Unspecified Escherichia coli [E. coli] as the cause of diseases classified elsewhere: Secondary | ICD-10-CM | POA: Diagnosis present

## 2018-07-05 DIAGNOSIS — M1990 Unspecified osteoarthritis, unspecified site: Secondary | ICD-10-CM | POA: Diagnosis not present

## 2018-07-05 DIAGNOSIS — I4510 Unspecified right bundle-branch block: Secondary | ICD-10-CM | POA: Diagnosis not present

## 2018-07-05 DIAGNOSIS — Z905 Acquired absence of kidney: Secondary | ICD-10-CM | POA: Diagnosis not present

## 2018-07-05 DIAGNOSIS — E875 Hyperkalemia: Secondary | ICD-10-CM | POA: Diagnosis present

## 2018-07-05 HISTORY — DX: Unspecified dementia, unspecified severity, without behavioral disturbance, psychotic disturbance, mood disturbance, and anxiety: F03.90

## 2018-07-05 LAB — CBC WITH DIFFERENTIAL/PLATELET
Abs Immature Granulocytes: 0.1 10*3/uL — ABNORMAL HIGH (ref 0.00–0.07)
Basophils Absolute: 0.1 10*3/uL (ref 0.0–0.1)
Basophils Relative: 1 %
Eosinophils Absolute: 0.1 10*3/uL (ref 0.0–0.5)
Eosinophils Relative: 1 %
HCT: 34.2 % — ABNORMAL LOW (ref 36.0–46.0)
Hemoglobin: 11.7 g/dL — ABNORMAL LOW (ref 12.0–15.0)
Immature Granulocytes: 1 %
Lymphocytes Relative: 12 %
Lymphs Abs: 1.1 10*3/uL (ref 0.7–4.0)
MCH: 30.2 pg (ref 26.0–34.0)
MCHC: 34.2 g/dL (ref 30.0–36.0)
MCV: 88.4 fL (ref 80.0–100.0)
Monocytes Absolute: 0.2 10*3/uL (ref 0.1–1.0)
Monocytes Relative: 2 %
Neutro Abs: 7.8 10*3/uL — ABNORMAL HIGH (ref 1.7–7.7)
Neutrophils Relative %: 83 %
Platelets: 330 10*3/uL (ref 150–400)
RBC: 3.87 MIL/uL (ref 3.87–5.11)
RDW: 13.2 % (ref 11.5–15.5)
WBC: 9.4 10*3/uL (ref 4.0–10.5)
nRBC: 0 % (ref 0.0–0.2)

## 2018-07-05 LAB — URINALYSIS, ROUTINE W REFLEX MICROSCOPIC
Bilirubin Urine: NEGATIVE
Glucose, UA: NEGATIVE mg/dL
Ketones, ur: NEGATIVE mg/dL
Nitrite: NEGATIVE
Protein, ur: 30 mg/dL — AB
Specific Gravity, Urine: 1.014 (ref 1.005–1.030)
WBC, UA: >50 WBC/hpf — ABNORMAL HIGH (ref 0–5)
pH: 6 (ref 5.0–8.0)

## 2018-07-05 MED ORDER — SODIUM CHLORIDE 0.9 % IV SOLN
1.0000 g | Freq: Once | INTRAVENOUS | Status: AC
  Administered 2018-07-06: 1 g via INTRAVENOUS
  Filled 2018-07-05: qty 10

## 2018-07-05 NOTE — ED Provider Notes (Signed)
[facility] DEPT Provider Note   CSN: [id number] Arrival date & time: 07/05/18  2213    History   Chief Complaint No chief complaint on file.   HPI Shannon Blair is a 85 y.o. female.     Patient presents to the emergency department with a chief complaint of "feeling bad." She comes to the ED via EMS from home with increased confusion.  Reportedly was recently diagnosed with a UTI, and per her POA, she has been more confused.  Level 5 caveat applies 2/2 AMS.  The history is provided by the patient. No language interpreter was used.    Past Medical History:  Diagnosis Date  . Anal cancer ([place]) 2004   Non-surgical mgt with chemo-radiation.  Dr Alen Blew follows.   . Anal fissure   . Colon, diverticulosis    Sigmoid. With associated stenosis.  Colonoscopy failed to intubate beyond stenosis in 08/2010.  rectal bx then negative for recurrent cancer.   . Hypertension   . OA (osteoarthritis)   . Squamous cell carcinoma    skin  . Varicose veins of left lower extremity with pain     Patient Active Problem List   Diagnosis Date Noted  . Spinal stenosis, lumbar region with neurogenic claudication 07/26/2016  . Pseudoaphakia 11/13/2011  . Nonspecific (abnormal) findings on radiological and other examination of gastrointestinal tract 06/26/2011  . Colitis 06/25/2011  . Rectal bleeding 06/25/2011  . History of rectal or anal cancer 06/25/2011  . HTN (hypertension) 06/25/2011    Past Surgical History:  Procedure Laterality Date  . ABDOMINAL HYSTERECTOMY    . APPENDECTOMY    . BLADDER REPAIR    . CATARACT EXTRACTION  05/2004    dr Ishmael Holter  . LUMBAR LAMINECTOMY/DECOMPRESSION MICRODISCECTOMY N/A 07/26/2016   Procedure: Central decompression, lumbar laminectomy L4-L5 and L5-S1, foraminotmy L5-S1 and L4-L5 bilateral;  Surgeon: Latanya Maudlin, MD;  Location: [facility] ORS;  Service: Orthopedics;  Laterality: N/A;  . NEPHRECTOMY  1970's   Rt. "Shrivelled up" ,   no cancer.   Marland Kitchen POLYPECTOMY       OB History   No obstetric history on file.      Home Medications    Prior to Admission medications   Medication Sig Start Date End Date Taking? Authorizing Provider  acetaminophen (TYLENOL) 325 MG tablet Take 650 mg by mouth every 6 (six) hours as needed for mild pain.     [provider]  aspirin EC 81 MG tablet Take 81 mg by mouth daily.    [provider]  calcium carbonate (OS-CAL) 600 MG TABS Take 600 mg by mouth 2 (two) times daily with a meal.      [provider]  cephALEXin (KEFLEX) 500 MG capsule Take 500 mg by mouth 4 (four) times daily. 07/17/16   [provider]  donepezil (ARICEPT) 10 MG tablet Take 10 mg by mouth at bedtime.  09/25/14   [provider]  HYDROcodone-acetaminophen (NORCO/VICODIN) 5-325 MG tablet Take 1 tablet by mouth every 6 (six) hours as needed (breakthrough pain). 07/27/16   Constable, Amber, PA-C  irbesartan (AVAPRO) 300 MG tablet Take 300 mg by mouth daily.    [provider]  memantine (NAMENDA) 10 MG tablet Take 10 mg by mouth 2 (two) times daily. 04/20/16   [provider]  methocarbamol (ROBAXIN) 500 MG tablet Take 1 tablet (500 mg total) by mouth every 6 (six) hours as needed for muscle spasms. 07/27/16   Ardeen Jourdain, PA-C  Misc Natural Products (OSTEO BI-FLEX ADV JOINT SHIELD PO) Take 1 tablet by mouth daily.      [provider]  Multiple Vitamin (MULITIVITAMIN WITH MINERALS) TABS Take 1 tablet by mouth daily.    [provider]  naproxen sodium (ANAPROX) 220 MG tablet Take 440 mg by mouth 2 (two) times daily as needed (for pain).    [provider]  Omega-3 Fatty Acids (FISH OIL) 1000 MG CAPS Take 1 capsule by mouth daily.      [provider]  oxybutynin (DITROPAN) 5 MG tablet Take 5 mg by mouth 2 (two) times daily. 09/08/14   [provider]  simvastatin (ZOCOR) 40 MG tablet Take 40 mg by mouth daily at 6  PM.     [provider]  venlafaxine (EFFEXOR) 37.5 MG tablet Take 37.5 mg by mouth 2 (two) times daily. 09/05/14   [provider]  vitamin C (ASCORBIC ACID) 500 MG tablet Take 500 mg by mouth daily.      [provider]  vitamin E 200 UNIT capsule Take 200 Units by mouth 3 (three) times daily.      [provider]    Family History Family History  Problem Relation Age of Onset  . Coronary artery disease Father   . Leukemia Brother   . Colon cancer Brother   . Breast cancer Sister   . Ovarian cancer Sister   . Diabetes Daughter     Social History Social History   Tobacco Use  . Smoking status: Never Smoker  . Smokeless tobacco: Never Used  Substance Use Topics  . Alcohol use: No  . Drug use: No     Allergies   Patient has no known allergies.   Review of Systems Review of Systems  Unable to perform ROS: Mental status change     Physical Exam Updated Vital Signs BP (!) 144/60 (BP Location: Right Arm)   Pulse 84   Temp 98 F (36.7 C) (Oral)   Resp 16   SpO2 98%   Physical Exam Vitals signs and nursing note reviewed.  Constitutional:      General: She is not in acute distress.    Appearance: She is well-developed.  HENT:     Head: Normocephalic and atraumatic.     Mouth/Throat:     Mouth: Mucous membranes are moist.  Eyes:     Conjunctiva/sclera: Conjunctivae normal.  Neck:     Musculoskeletal: Neck supple.  Cardiovascular:     Rate and Rhythm: Normal rate and regular rhythm.     Heart sounds: No murmur.  Pulmonary:     Effort: Pulmonary effort is normal. No respiratory distress.     Breath sounds: Normal breath sounds. No wheezing.  Abdominal:     General: There is no distension.     Palpations: Abdomen is soft.     Tenderness: There is no abdominal tenderness.  Musculoskeletal: Normal range of motion.  Skin:    General: Skin is warm and dry.  Neurological:     Mental Status: She is alert.     Comments:  Confused, alert to name and place      ED Treatments / Results  Labs (all labs ordered are listed, but only abnormal results are displayed) Labs Reviewed  CBC WITH DIFFERENTIAL/PLATELET - Abnormal; Notable for the following components:      Result Value   Hemoglobin 11.7 (*)    HCT 34.2 (*)    Neutro Abs 7.8 (*)  Abs Immature Granulocytes 0.10 (*)    All other components within normal limits  COMPREHENSIVE METABOLIC PANEL - Abnormal; Notable for the following components:   Sodium 131 (*)    Potassium 5.2 (*)    CO2 21 (*)    Glucose, Bld 128 (*)    BUN 72 (*)    Creatinine, Ser 3.11 (*)    Calcium 13.0 (*)    GFR calc non Af Amer 13 (*)    GFR calc Af Amer 15 (*)    All other components within normal limits  URINALYSIS, ROUTINE W REFLEX MICROSCOPIC - Abnormal; Notable for the following components:   APPearance HAZY (*)    Hgb urine dipstick MODERATE (*)    Protein, ur 30 (*)    Leukocytes,Ua LARGE (*)    WBC, UA >50 (*)    Bacteria, UA RARE (*)    All other components within normal limits  URINE CULTURE  CULTURE, BLOOD (ROUTINE X 2)  CULTURE, BLOOD (ROUTINE X 2)  TROPONIN I  CREATININE, URINE, RANDOM  SODIUM, URINE, RANDOM  CALCITRIOL (1,25 DI-OH VIT D)  PARATHYROID HORMONE, INTACT (NO CA)  PTH-RELATED PEPTIDE    EKG None  Radiology Dg Chest 2 View  Result Date: 07/05/2018 CLINICAL DATA:  Altered mental status EXAM: CHEST - 2 VIEW COMPARISON:  07/18/2016 FINDINGS: Cardiac shadows within normal limits. The lungs are well aerated bilaterally. No focal infiltrate is noted. Mild aortic calcifications are seen. Degenerative changes of the thoracic spine are noted. IMPRESSION: No active cardiopulmonary disease. Electronically Signed   By: Inez Catalina M.D.   On: 07/05/2018 23:54   Ct Head Wo Contrast  Result Date: 07/05/2018 CLINICAL DATA:  Altered mental status EXAM: CT HEAD WITHOUT CONTRAST TECHNIQUE: Contiguous axial images were obtained from the base of the  skull through the vertex without intravenous contrast. COMPARISON:  07/07/2015 FINDINGS: Brain: Chronic atrophic and ischemic changes are identified. No findings to suggest acute hemorrhage, acute infarction or space-occupying mass lesion are seen. Vascular: No hyperdense vessel or unexpected calcification. Skull: Normal. Negative for fracture or focal lesion. Sinuses/Orbits: No acute finding. Other: None. IMPRESSION: Chronic atrophic and ischemic changes without acute abnormality. Electronically Signed   By: Inez Catalina M.D.   On: 07/05/2018 23:57    Procedures Procedures (including critical care time)  Medications Ordered in ED Medications - No data to display   Initial Impression / Assessment and Plan / ED Course  I have reviewed the triage vital signs and the nursing notes.  Pertinent labs & imaging results that were available during my care of the patient were reviewed by me and considered in my medical decision making (see chart for details).        Patient with increased confusion, recently diagnosed UTI, and decreased oral intake over the past 4 days.  History provided by medical records, staff, and the patient's POA.  Urinalysis is consistent with UTI.  Laboratory work-up shows AKI with increased reactant and BUN.  Will give fluids.  Will start Rocephin.  Patient seen by discussed with Dr. Wyvonnia Dusky, who agrees with plan for admission.  Appreciate Dr. Blaine Hamper, for admitting the patient.  Final Clinical Impressions(s) / ED Diagnoses   Final diagnoses:  Urinary tract infection without hematuria, site unspecified  Altered mental status, unspecified altered mental status type  AKI (acute kidney injury) [facility])    ED Discharge Orders    None       Montine Circle, PA-C 07/06/18 0046    Rancour, Annie Main,  MD 07/06/18 [phone]

## 2018-07-05 NOTE — ED Notes (Signed)
Urine culture sent to the lab. 

## 2018-07-05 NOTE — ED Triage Notes (Signed)
Pt comes to ed via ems, from home c/o AMS , pt has hx of dementia and sun downers. Pt has a recent UTI diagnoses on 14th and POA of pt has been monitoring her daily, and noticed she is more altered than normal. V/s 146/82, hr 84, rr16, 95 spo2, cbg 135. Uses a walker and not steady on feet bilateral. Lungs clear. Denies NVD and no covid exposure risks.

## 2018-07-05 NOTE — ED Notes (Signed)
Patient transported to X-ray 

## 2018-07-05 NOTE — ED Notes (Signed)
Bed: XB35 Expected date:  Expected time:  Means of arrival:  Comments: 73F- UTI- dementia

## 2018-07-05 NOTE — ED Notes (Signed)
Lab called and they will add on a urine culture.

## 2018-07-06 ENCOUNTER — Encounter (HOSPITAL_COMMUNITY): Payer: Self-pay | Admitting: Internal Medicine

## 2018-07-06 ENCOUNTER — Other Ambulatory Visit: Payer: Self-pay

## 2018-07-06 DIAGNOSIS — Z905 Acquired absence of kidney: Secondary | ICD-10-CM | POA: Diagnosis not present

## 2018-07-06 DIAGNOSIS — I10 Essential (primary) hypertension: Secondary | ICD-10-CM | POA: Diagnosis present

## 2018-07-06 DIAGNOSIS — N390 Urinary tract infection, site not specified: Secondary | ICD-10-CM | POA: Diagnosis present

## 2018-07-06 DIAGNOSIS — N3000 Acute cystitis without hematuria: Secondary | ICD-10-CM | POA: Diagnosis present

## 2018-07-06 DIAGNOSIS — E860 Dehydration: Secondary | ICD-10-CM | POA: Diagnosis present

## 2018-07-06 DIAGNOSIS — F0390 Unspecified dementia without behavioral disturbance: Secondary | ICD-10-CM | POA: Diagnosis present

## 2018-07-06 DIAGNOSIS — Z7982 Long term (current) use of aspirin: Secondary | ICD-10-CM | POA: Diagnosis not present

## 2018-07-06 DIAGNOSIS — Z85828 Personal history of other malignant neoplasm of skin: Secondary | ICD-10-CM | POA: Diagnosis not present

## 2018-07-06 DIAGNOSIS — Z1629 Resistance to other single specified antibiotic: Secondary | ICD-10-CM | POA: Diagnosis present

## 2018-07-06 DIAGNOSIS — B9620 Unspecified Escherichia coli [E. coli] as the cause of diseases classified elsewhere: Secondary | ICD-10-CM | POA: Diagnosis present

## 2018-07-06 DIAGNOSIS — N179 Acute kidney failure, unspecified: Secondary | ICD-10-CM | POA: Diagnosis present

## 2018-07-06 DIAGNOSIS — G9341 Metabolic encephalopathy: Secondary | ICD-10-CM | POA: Diagnosis present

## 2018-07-06 DIAGNOSIS — Z9221 Personal history of antineoplastic chemotherapy: Secondary | ICD-10-CM | POA: Diagnosis not present

## 2018-07-06 DIAGNOSIS — M1990 Unspecified osteoarthritis, unspecified site: Secondary | ICD-10-CM | POA: Diagnosis present

## 2018-07-06 DIAGNOSIS — Z86010 Personal history of colonic polyps: Secondary | ICD-10-CM | POA: Diagnosis not present

## 2018-07-06 DIAGNOSIS — E875 Hyperkalemia: Secondary | ICD-10-CM | POA: Diagnosis present

## 2018-07-06 DIAGNOSIS — Z85048 Personal history of other malignant neoplasm of rectum, rectosigmoid junction, and anus: Secondary | ICD-10-CM | POA: Diagnosis not present

## 2018-07-06 DIAGNOSIS — Z79899 Other long term (current) drug therapy: Secondary | ICD-10-CM | POA: Diagnosis not present

## 2018-07-06 DIAGNOSIS — N170 Acute kidney failure with tubular necrosis: Secondary | ICD-10-CM | POA: Diagnosis present

## 2018-07-06 DIAGNOSIS — Z923 Personal history of irradiation: Secondary | ICD-10-CM | POA: Diagnosis not present

## 2018-07-06 LAB — COMPREHENSIVE METABOLIC PANEL
ALT: 21 U/L (ref 0–44)
AST: 17 U/L (ref 15–41)
Albumin: 3.9 g/dL (ref 3.5–5.0)
Alkaline Phosphatase: 112 U/L (ref 38–126)
Anion gap: 9 (ref 5–15)
BUN: 72 mg/dL — ABNORMAL HIGH (ref 8–23)
CO2: 21 mmol/L — ABNORMAL LOW (ref 22–32)
Calcium: 13 mg/dL — ABNORMAL HIGH (ref 8.9–10.3)
Chloride: 101 mmol/L (ref 98–111)
Creatinine, Ser: 3.11 mg/dL — ABNORMAL HIGH (ref 0.44–1.00)
GFR calc Af Amer: 15 mL/min — ABNORMAL LOW (ref >60)
GFR calc non Af Amer: 13 mL/min — ABNORMAL LOW (ref >60)
Glucose, Bld: 128 mg/dL — ABNORMAL HIGH (ref 70–99)
Potassium: 5.2 mmol/L — ABNORMAL HIGH (ref 3.5–5.1)
Sodium: 131 mmol/L — ABNORMAL LOW (ref 135–145)
Total Bilirubin: 0.4 mg/dL (ref 0.3–1.2)
Total Protein: 7.1 g/dL (ref 6.5–8.1)

## 2018-07-06 LAB — BASIC METABOLIC PANEL
Anion gap: 8 (ref 5–15)
Anion gap: 10 (ref 5–15)
BUN: 71 mg/dL — ABNORMAL HIGH (ref 8–23)
BUN: 63 mg/dL — ABNORMAL HIGH (ref 8–23)
CO2: 23 mmol/L (ref 22–32)
CO2: 17 mmol/L — ABNORMAL LOW (ref 22–32)
Calcium: 13.1 mg/dL (ref 8.9–10.3)
Calcium: 11.3 mg/dL — ABNORMAL HIGH (ref 8.9–10.3)
Chloride: 100 mmol/L (ref 98–111)
Chloride: 108 mmol/L (ref 98–111)
Creatinine, Ser: 3.03 mg/dL — ABNORMAL HIGH (ref 0.44–1.00)
Creatinine, Ser: 2.53 mg/dL — ABNORMAL HIGH (ref 0.44–1.00)
GFR calc Af Amer: 16 mL/min — ABNORMAL LOW (ref >60)
GFR calc Af Amer: 20 mL/min — ABNORMAL LOW (ref >60)
GFR calc non Af Amer: 14 mL/min — ABNORMAL LOW (ref >60)
GFR calc non Af Amer: 17 mL/min — ABNORMAL LOW (ref >60)
Glucose, Bld: 124 mg/dL — ABNORMAL HIGH (ref 70–99)
Glucose, Bld: 98 mg/dL (ref 70–99)
Potassium: 5.4 mmol/L — ABNORMAL HIGH (ref 3.5–5.1)
Potassium: 4.3 mmol/L (ref 3.5–5.1)
Sodium: 131 mmol/L — ABNORMAL LOW (ref 135–145)
Sodium: 135 mmol/L (ref 135–145)

## 2018-07-06 LAB — CBC
HCT: 35.7 % — ABNORMAL LOW (ref 36.0–46.0)
Hemoglobin: 12.3 g/dL (ref 12.0–15.0)
MCH: 30.2 pg (ref 26.0–34.0)
MCHC: 34.5 g/dL (ref 30.0–36.0)
MCV: 87.7 fL (ref 80.0–100.0)
Platelets: 242 10*3/uL (ref 150–400)
RBC: 4.07 MIL/uL (ref 3.87–5.11)
RDW: 13.2 % (ref 11.5–15.5)
WBC: 8.3 10*3/uL (ref 4.0–10.5)
nRBC: 0 % (ref 0.0–0.2)

## 2018-07-06 LAB — CREATININE, URINE, RANDOM: Creatinine, Urine: 74.84 mg/dL

## 2018-07-06 LAB — SODIUM, URINE, RANDOM: Sodium, Ur: 77 mmol/L

## 2018-07-06 LAB — TROPONIN I: Troponin I: <0.03 ng/mL (ref <0.03)

## 2018-07-06 LAB — PHOSPHORUS: Phosphorus: 3.7 mg/dL (ref 2.5–4.6)

## 2018-07-06 MED ORDER — HEPARIN SODIUM (PORCINE) 5000 UNIT/ML IJ SOLN
5000.0000 [IU] | Freq: Three times a day (TID) | INTRAMUSCULAR | Status: DC
  Administered 2018-07-06 – 2018-07-08 (×6): 5000 [IU] via SUBCUTANEOUS
  Filled 2018-07-06 (×6): qty 1

## 2018-07-06 MED ORDER — MEMANTINE HCL 10 MG PO TABS
10.0000 mg | ORAL_TABLET | Freq: Two times a day (BID) | ORAL | Status: DC
  Administered 2018-07-06 – 2018-07-08 (×4): 10 mg via ORAL
  Filled 2018-07-06 (×4): qty 1

## 2018-07-06 MED ORDER — SODIUM CHLORIDE 0.9 % IV BOLUS
1000.0000 mL | Freq: Once | INTRAVENOUS | Status: AC
  Administered 2018-07-06: 1000 mL via INTRAVENOUS

## 2018-07-06 MED ORDER — ACETAMINOPHEN 325 MG PO TABS
650.0000 mg | ORAL_TABLET | Freq: Four times a day (QID) | ORAL | Status: DC | PRN
  Filled 2018-07-06: qty 2

## 2018-07-06 MED ORDER — OMEGA-3-ACID ETHYL ESTERS 1 G PO CAPS
1.0000 g | ORAL_CAPSULE | Freq: Every day | ORAL | Status: DC
  Administered 2018-07-06 – 2018-07-08 (×3): 1 g via ORAL
  Filled 2018-07-06 (×3): qty 1

## 2018-07-06 MED ORDER — VITAMIN C 500 MG PO TABS
500.0000 mg | ORAL_TABLET | Freq: Every day | ORAL | Status: DC
  Administered 2018-07-06 – 2018-07-08 (×3): 500 mg via ORAL
  Filled 2018-07-06 (×3): qty 1

## 2018-07-06 MED ORDER — OXYBUTYNIN CHLORIDE 5 MG PO TABS
5.0000 mg | ORAL_TABLET | Freq: Two times a day (BID) | ORAL | Status: DC
  Administered 2018-07-06 – 2018-07-08 (×4): 5 mg via ORAL
  Filled 2018-07-06 (×4): qty 1

## 2018-07-06 MED ORDER — ASPIRIN EC 81 MG PO TBEC
81.0000 mg | DELAYED_RELEASE_TABLET | Freq: Every day | ORAL | Status: DC
  Administered 2018-07-06 – 2018-07-08 (×3): 81 mg via ORAL
  Filled 2018-07-06 (×3): qty 1

## 2018-07-06 MED ORDER — ACETAMINOPHEN 650 MG RE SUPP: 650.0000 mg | Freq: Four times a day (QID) | RECTAL | Status: DC | PRN

## 2018-07-06 MED ORDER — ONDANSETRON HCL 4 MG PO TABS: 4.0000 mg | ORAL_TABLET | Freq: Four times a day (QID) | ORAL | Status: DC | PRN

## 2018-07-06 MED ORDER — SODIUM CHLORIDE 0.9 % IV SOLN
1.0000 g | INTRAVENOUS | Status: DC
  Administered 2018-07-06 – 2018-07-07 (×2): 1 g via INTRAVENOUS
  Filled 2018-07-06: qty 1
  Filled 2018-07-06 (×2): qty 10

## 2018-07-06 MED ORDER — ENSURE ENLIVE PO LIQD
237.0000 mL | Freq: Two times a day (BID) | ORAL | Status: DC
  Administered 2018-07-07 – 2018-07-08 (×3): 237 mL via ORAL

## 2018-07-06 MED ORDER — SODIUM POLYSTYRENE SULFONATE 15 GM/60ML PO SUSP
15.0000 g | Freq: Once | ORAL | Status: AC
  Administered 2018-07-06: 15 g via ORAL
  Filled 2018-07-06: qty 60

## 2018-07-06 MED ORDER — HYDRALAZINE HCL 20 MG/ML IJ SOLN: 5.0000 mg | INTRAMUSCULAR | Status: DC | PRN

## 2018-07-06 MED ORDER — SODIUM CHLORIDE 0.9 % IV SOLN
INTRAVENOUS | Status: DC
  Administered 2018-07-06 – 2018-07-08 (×4): via INTRAVENOUS

## 2018-07-06 MED ORDER — SODIUM CHLORIDE 0.9 % IV SOLN
90.0000 mg | Freq: Once | INTRAVENOUS | Status: AC
  Administered 2018-07-06: 90 mg via INTRAVENOUS
  Filled 2018-07-06: qty 10

## 2018-07-06 MED ORDER — ONDANSETRON HCL 4 MG/2ML IJ SOLN
4.0000 mg | Freq: Four times a day (QID) | INTRAMUSCULAR | Status: DC | PRN
  Administered 2018-07-06: 4 mg via INTRAVENOUS
  Filled 2018-07-06: qty 2

## 2018-07-06 MED ORDER — VITAMIN E 45 MG (100 UNIT) PO CAPS
200.0000 [IU] | ORAL_CAPSULE | Freq: Three times a day (TID) | ORAL | Status: DC
  Administered 2018-07-06 – 2018-07-08 (×5): 200 [IU] via ORAL
  Filled 2018-07-06 (×7): qty 2

## 2018-07-06 MED ORDER — VENLAFAXINE HCL ER 75 MG PO CP24
75.0000 mg | ORAL_CAPSULE | Freq: Every day | ORAL | Status: DC
  Administered 2018-07-06 – 2018-07-08 (×3): 75 mg via ORAL
  Filled 2018-07-06 (×3): qty 1

## 2018-07-06 MED ORDER — DONEPEZIL HCL 10 MG PO TABS
10.0000 mg | ORAL_TABLET | Freq: Every day | ORAL | Status: DC
  Administered 2018-07-07: 10 mg via ORAL
  Filled 2018-07-06: qty 1

## 2018-07-06 NOTE — Evaluation (Signed)
Physical Therapy Evaluation Patient Details Name: Shannon Blair MRN: [id number] DOB: [birth year] Today's Date: 07/06/2018   History of Present Illness  85 y.o. female with medical history significant of dementia, hypertension, anal cancer 2004, skin squamous cell cancer, varicose veins in leg, and admitted for acute metabolic encephalopathy and Acute cystitis without hematuria  Clinical Impression  Pt admitted with above diagnosis. Pt currently with functional limitations due to the deficits listed below (see PT Problem List).  Pt will benefit from skilled PT to increase their independence and safety with mobility to allow discharge to the venue listed below.  Pt with decreased cognition, does not appear at baseline according to chart review.  Pt assisted with standing twice however presents with posterior bias and difficulty weight shifting to initiate ambulating.  Currently recommend SNF however may progress to home with cognitive improvements.     Follow Up Recommendations SNF;Supervision/Assistance - 24 hour    Equipment Recommendations  None recommended by PT    Recommendations for Other Services       Precautions / Restrictions Precautions Precautions: Fall Precaution Comments: incontinent at this time Restrictions Weight Bearing Restrictions: No      Mobility  Bed Mobility Overal bed mobility: Needs Assistance Bed Mobility: Supine to Sit;Sit to Supine     Supine to sit: Mod assist Sit to supine: Mod assist   General bed mobility comments: assist for trunk upright and LEs over EOB, assist for LEs onto bed  Transfers Overall transfer level: Needs assistance Equipment used: Rolling walker (2 wheeled) Transfers: Sit to/from Stand Sit to Stand: Mod assist         General transfer comment: verbal cues for safe technique, performed twice, pt unable to weight shift to march in place, strong posterior bias however able to correct with second stand  Ambulation/Gait              General Gait Details: deferred for safety  Stairs            Wheelchair Mobility    Modified Rankin (Stroke Patients Only)       Balance Overall balance assessment: Needs assistance         Standing balance support: Bilateral upper extremity supported Standing balance-Leahy Scale: Zero                               Pertinent Vitals/Pain Pain Assessment: No/denies pain    Home Living Family/patient expects to be discharged to:: Private residence Living Arrangements: Alone Available Help at Discharge: Family Type of Home: House Home Access: Stairs to enter   Technical brewer of Steps: 1 Home Layout: Able to live on main level with bedroom/bathroom Home Equipment: Walker - 2 wheels;Cane - single point Additional Comments: above per previous admission, pt reports living alone, unable to recall stairs, states sister checks in    Prior Function Level of Independence: Independent   Gait / Transfers Assistance Needed: per chart review, typically independent, pt reports occasional use of assistive devices           Hand Dominance        Extremity/Trunk Assessment        Lower Extremity Assessment Lower Extremity Assessment: Generalized weakness       Communication   Communication: No difficulties  Cognition Arousal/Alertness: Awake/alert Behavior During Therapy: WFL for tasks assessed/performed Overall Cognitive Status: No family/caregiver present to determine baseline cognitive functioning  General Comments: mild dementia per chart, pt with slow processing and requiring safety cues      General Comments      Exercises     Assessment/Plan    PT Assessment Patient needs continued PT services  PT Problem List Decreased strength;Decreased mobility;Decreased activity tolerance;Decreased balance;Decreased knowledge of use of DME;Decreased cognition;Decreased safety  awareness       PT Treatment Interventions DME instruction;Functional mobility training;Balance training;Gait training;Therapeutic activities;Neuromuscular re-education;Therapeutic exercise;Patient/family education    PT Goals (Current goals can be found in the Care Plan section)  Acute Rehab PT Goals PT Goal Formulation: Patient unable to participate in goal setting Time For Goal Achievement: 07/20/18 Potential to Achieve Goals: Good    Frequency Min 3X/week   Barriers to discharge        Co-evaluation               AM-PAC PT "6 Clicks" Mobility  Outcome Measure Help needed turning from your back to your side while in a flat bed without using bedrails?: A Lot Help needed moving from lying on your back to sitting on the side of a flat bed without using bedrails?: A Lot Help needed moving to and from a bed to a chair (including a wheelchair)?: A Lot Help needed standing up from a chair using your arms (e.g., wheelchair or bedside chair)?: A Lot Help needed to walk in hospital room?: Total Help needed climbing 3-5 steps with a railing? : Total 6 Click Score: 10    End of Session Equipment Utilized During Treatment: Gait belt Activity Tolerance: Patient tolerated treatment well Patient left: in bed;with call bell/phone within reach;with nursing/sitter in room(sitter)   PT Visit Diagnosis: Other abnormalities of gait and mobility (R26.89);Muscle weakness (generalized) (M62.81)    Time: 3704-8889 PT Time Calculation (min) (ACUTE ONLY): 12 min   Charges:   PT Evaluation $PT Eval Low Complexity: [place], PT, DPT Acute Rehabilitation Services Office: [phone] Pager: [phone]  Trena Platt 07/06/2018, 10:54 AM

## 2018-07-06 NOTE — ED Notes (Signed)
Cultures gathered post antibiotics ordered

## 2018-07-06 NOTE — Progress Notes (Addendum)
Shannon Blair, POA, dropped papers off and are In the chart. States she has been taking care of her for the past two years. No other family member has been taking care of her per [facility].

## 2018-07-06 NOTE — Progress Notes (Signed)
Despite frequent reorientation * freq nurse interactions pt is not using call bell, bed alarm on and multiple attempts to get out of bed. Pt is incontinent of urine & stool wanting to go to the toilet but is very unsteady on her feet. Fall risk, requested Air cabin crew

## 2018-07-06 NOTE — ED Notes (Signed)
ED TO INPATIENT HANDOFF REPORT  ED Nurse Name and Phone #: [place]aren [place] ed [phone]  S Name/Age/Gender Shannon Blair 85 y.o. female Room/Bed: WA25/WA25  Code Status   Code Status: Full Code  Home/SNF/Other Home Patient oriented to: self Is this baseline? Yes   Triage Complete: Triage complete  Chief Complaint UTI/Altered Mental Status  Triage Note Pt comes to ed via ems, from home c/o AMS , pt has hx of dementia and sun downers. Pt has a recent UTI diagnoses on 14th and POA of pt has been monitoring her daily, and noticed she is more altered than normal. V/s 146/82, hr 84, rr16, 95 spo2, cbg 135. Uses a walker and not steady on feet bilateral. Lungs clear. Denies NVD and no covid exposure risks.     Allergies No Known Allergies  Level of Care/Admitting Diagnosis ED Disposition    ED Disposition Condition Comment   Admit  Hospital Area: [facility] [[id number]]  Level of Care: Telemetry [5]  Admit to tele based on following criteria: Other see comments  Comments: hyperkalemia  Covid Evaluation: N/A  Diagnosis: Acute metabolic encephalopathy [9179150]  Admitting Physician: Ivor Costa [[id number]]  Attending Physician: Ivor Costa [[id number]]  PT Class (Do Not Modify): Observation [104]  PT Acc Code (Do Not Modify): Observation [10022]       B Medical/Surgery History Past Medical History:  Diagnosis Date  . Anal cancer ([place]) 2004   Non-surgical mgt with chemo-radiation.  Dr Alen Blew follows.   . Anal fissure   . Colon, diverticulosis    Sigmoid. With associated stenosis.  Colonoscopy failed to intubate beyond stenosis in 08/2010.  rectal bx then negative for recurrent cancer.   . Dementia ([place])   . Hypertension   . OA (osteoarthritis)   . Squamous cell carcinoma    skin  . Varicose veins of left lower extremity with pain    Past Surgical History:  Procedure Laterality Date  . ABDOMINAL HYSTERECTOMY    . APPENDECTOMY    . BLADDER REPAIR    . CATARACT  EXTRACTION  05/2004    dr Ishmael Holter  . LUMBAR LAMINECTOMY/DECOMPRESSION MICRODISCECTOMY N/A 07/26/2016   Procedure: Central decompression, lumbar laminectomy L4-L5 and L5-S1, foraminotmy L5-S1 and L4-L5 bilateral;  Surgeon: Latanya Maudlin, MD;  Location: [facility];  Service: Orthopedics;  Laterality: N/A;  . NEPHRECTOMY  1970's   Rt. "Shrivelled up" ,  no cancer.   [place] Kitchen POLYPECTOMY       A IV Location/Drains/Wounds Patient Lines/Drains/Airways Status   Active Line/Drains/Airways    Name:   Placement date:   Placement time:   Site:   Days:   Peripheral IV 07/06/18 Right Antecubital   07/06/18    0042    Antecubital   less than 1   External Urinary Catheter   07/05/18    2234    -   1   Incision (Closed) 07/26/16 Back Other (Comment)   07/26/16    1416     710          Intake/Output Last 24 hours  Intake/Output Summary (Last 24 hours) at 07/06/2018 0226 Last data filed at 07/05/2018 2234 Gross per 24 hour  Intake -  Output 0 ml  Net 0 ml    Labs/Imaging Results for orders placed or performed during the hospital encounter of 07/05/18 (from the past 48 hour(s))  CBC with Differential/Platelet     Status: Abnormal   Collection Time: 07/05/18 11:18 PM  Result Value Ref Range  WBC 9.4 4.0 - 10.5 K/uL   RBC 3.87 3.87 - 5.11 MIL/uL   Hemoglobin 11.7 (L) 12.0 - 15.0 g/dL   HCT 34.2 (L) 36.0 - 46.0 %   MCV 88.4 80.0 - 100.0 fL   MCH 30.2 26.0 - 34.0 pg   MCHC 34.2 30.0 - 36.0 g/dL   RDW 13.2 11.5 - 15.5 %   Platelets 330 150 - 400 K/uL   nRBC 0.0 0.0 - 0.2 %   Neutrophils Relative % 83 %   Neutro Abs 7.8 (H) 1.7 - 7.7 K/uL   Lymphocytes Relative 12 %   Lymphs Abs 1.1 0.7 - 4.0 K/uL   Monocytes Relative 2 %   Monocytes Absolute 0.2 0.1 - 1.0 K/uL   Eosinophils Relative 1 %   Eosinophils Absolute 0.1 0.0 - 0.5 K/uL   Basophils Relative 1 %   Basophils Absolute 0.1 0.0 - 0.1 K/uL   Immature Granulocytes 1 %   Abs Immature Granulocytes 0.10 (H) 0.00 - 0.07 K/uL    Comment: Performed  at [facility], [place] [address] [place], [place] [address]  Comprehensive metabolic panel     Status: Abnormal   Collection Time: 07/05/18 11:18 PM  Result Value Ref Range   Sodium 131 (L) 135 - 145 mmol/L   Potassium 5.2 (H) 3.5 - 5.1 mmol/L   Chloride 101 98 - 111 mmol/L   CO2 21 (L) 22 - 32 mmol/L   Glucose, Bld 128 (H) 70 - 99 mg/dL   BUN 72 (H) 8 - 23 mg/dL   Creatinine, Ser 3.11 (H) 0.44 - 1.00 mg/dL   Calcium 13.0 (H) 8.9 - 10.3 mg/dL   Total Protein 7.1 6.5 - 8.1 g/dL   Albumin 3.9 3.5 - 5.0 g/dL   AST 17 15 - 41 U/L   ALT 21 0 - 44 U/L   Alkaline Phosphatase 112 38 - 126 U/L   Total Bilirubin 0.4 0.3 - 1.2 mg/dL   GFR calc non Af Amer 13 (L) >60 mL/min   GFR calc Af Amer 15 (L) >60 mL/min   Anion gap 9 5 - 15    Comment: Performed at [facility], [place] [address], [place], [place] [address]  Troponin I - ONCE - STAT     Status: None   Collection Time: 07/05/18 11:18 PM  Result Value Ref Range   Troponin I <0.03 <0.03 ng/mL    Comment: Performed at [facility], [place] [address], [place], [place] [address]  Urinalysis, Routine w reflex microscopic     Status: Abnormal   Collection Time: 07/05/18 11:18 PM  Result Value Ref Range   Color, Urine YELLOW YELLOW   APPearance HAZY (A) CLEAR   Specific Gravity, Urine 1.014 1.005 - 1.030   pH 6.0 5.0 - 8.0   Glucose, UA NEGATIVE NEGATIVE mg/dL   Hgb urine dipstick MODERATE (A) NEGATIVE   Bilirubin Urine NEGATIVE NEGATIVE   Ketones, ur NEGATIVE NEGATIVE mg/dL   Protein, ur 30 (A) NEGATIVE mg/dL   Nitrite NEGATIVE NEGATIVE   Leukocytes,Ua LARGE (A) NEGATIVE   RBC / HPF 21-50 0 - 5 RBC/hpf   WBC, UA >50 (H) 0 - 5 WBC/hpf   Bacteria, UA RARE (A) NONE SEEN    Comment: Performed at [facility], [place] [address], [place],  [address]  Creatinine, urine, random     Status: None   Collection Time: 07/05/18 11:54 PM  Result Value Ref Range  Creatinine, Urine 74.84 mg/dL    Comment: Performed at [facility], [place] [address] [place], [place] [address]  Sodium, urine, random     Status: None   Collection Time: 07/05/18 11:54 PM  Result Value Ref Range   Sodium, Ur 77 mmol/L    Comment: Performed at [facility], [place] [address] [place], [place] [address]  Phosphorus     Status: None   Collection Time: 07/06/18 12:30 AM  Result Value Ref Range   Phosphorus 3.7 2.5 - 4.6 mg/dL    Comment: Performed at [facility], [place] [address] [place], [place] [address]   Dg Chest 2 View  Result Date: 07/05/2018 CLINICAL DATA:  Altered mental status EXAM: CHEST - 2 VIEW COMPARISON:  07/18/2016 FINDINGS: Cardiac shadows within normal limits. The lungs are well aerated bilaterally. No focal infiltrate is noted. Mild aortic calcifications are seen. Degenerative changes of the thoracic spine are noted. IMPRESSION: No active cardiopulmonary disease. Electronically Signed   By: Inez Catalina M.D.   On: 07/05/2018 23:54   Ct Head Wo Contrast  Result Date: 07/05/2018 CLINICAL DATA:  Altered mental status EXAM: CT HEAD WITHOUT CONTRAST TECHNIQUE: Contiguous axial images were obtained from the base of the skull through the vertex without intravenous contrast. COMPARISON:  07/07/2015 FINDINGS: Brain: Chronic atrophic and ischemic changes are identified. No findings to suggest acute hemorrhage, acute infarction or space-occupying mass lesion are seen. Vascular: No hyperdense vessel or unexpected calcification. Skull: Normal. Negative for fracture or focal lesion. Sinuses/Orbits: No acute finding. Other: None. IMPRESSION: Chronic atrophic and ischemic changes without acute abnormality. Electronically Signed   By: Inez Catalina M.D.   On: 07/05/2018 23:57    Pending Labs Unresulted Labs (From admission, onward)    Start     Ordered   07/06/18 3664  Basic metabolic panel  Tomorrow morning,   R      07/06/18 0053   07/06/18 0500  CBC  Tomorrow morning,   R     07/06/18 0053   07/06/18 4034  Basic metabolic panel  ONCE - STAT,   R     07/06/18 0200   07/06/18 0036  Calcitriol (1,25 di-OH Vit D)  Once,   R     07/06/18 0036   07/06/18 0036  Parathyroid hormone, intact (no Ca)  Once,   R     07/06/18 0036   07/06/18 0036  PTH-related peptide  Once,   R     07/06/18 0036   07/06/18 0036  Culture, blood (Routine X 2) w Reflex to ID Panel  BLOOD CULTURE X 2,   STAT     07/06/18 0036   07/05/18 2351  Urine culture  ONCE - STAT,   STAT     07/05/18 2350          Vitals/Pain Today's Vitals   07/05/18 2229 07/05/18 2322  BP: (!) 144/60   Pulse: 84   Resp: 16   Temp: 98 F (36.7 C) 98.4 F (36.9 C)  TempSrc: Oral Rectal  SpO2: 98%     Isolation Precautions No active isolations  Medications Medications  0.9 %  sodium chloride infusion ( Intravenous New Bag/Given 07/06/18 0220)  cefTRIAXone (ROCEPHIN) 1 g in sodium chloride 0.9 % 100 mL IVPB (has no administration in time range)  hydrALAZINE (APRESOLINE) injection 5 mg (has no administration in time range)  sodium polystyrene (KAYEXALATE) 15 GM/60ML suspension 15 g (has no administration in time range)  heparin injection 5,000 Units (has no administration in time range)  acetaminophen (TYLENOL) tablet 650 mg (has no administration in time range)    Or  acetaminophen (TYLENOL) suppository 650 mg (has no administration in time range)  ondansetron (ZOFRAN) tablet 4 mg (has no administration in time range)    Or  ondansetron (ZOFRAN) injection 4 mg (has no administration in time range)  cefTRIAXone (ROCEPHIN) 1 g in sodium chloride 0.9 % 100 mL IVPB (0 g Intravenous Stopped 07/06/18 0127)  sodium chloride 0.9 % bolus 1,000 mL (0 mLs Intravenous Stopped 07/06/18 0220)  sodium chloride 0.9 % bolus 1,000 mL (1,000 mLs Intravenous New Bag/Given 07/06/18 0139)    Mobility walks with device     Focused Assessments Neuro  Assessment Handoff:  Swallow screen pass? Yes          Neuro Assessment:   Neuro Checks:      Last Documented NIHSS Modified Score:   Has TPA been given? No If patient is a Neuro Trauma and patient is going to OR before floor call report to [place] nurse: [phone] or [phone]     R Recommendations: See Admitting Provider Note  Report given to:   Additional Notes:  Base line questionable and unable to walk today

## 2018-07-06 NOTE — Progress Notes (Signed)
PROGRESS NOTE    Shannon Blair  YOV:[id number] DOB: [birth year] DOA: 07/05/2018 PCP: Merrilee Seashore, MD    Brief Narrative:  Shannon Blair is a 85 y.o. female with medical history significant of dementia, hypertension, anal cancer 2004, skin squamous cell cancer, varicose veins in leg,who resents with altered mental status, decreased oral intake.  Patient has dementia and AMS. She is unable to provide accurate medical history, therefore, most of the history is obtained by discussing the case with ED physician, per EMS report, and with the nursing staff.   Per report, pt has increased confusion than her baseline and decreased oral intake in the past several days. She was diagnosed with UTI by PCP, and put her on Bactrim on 4/14.  Not sure if patient has symptoms for UTI.  When I saw patient in ED, she is alert, but confused.  She is not orientated x3.  She moves all extremities normally.  No facial droop or slurred speech.  She does not have active nausea, vomiting, diarrhea, cough, respiratory distress.  She denies chest pain or abdominal pain.  ED Course: pt was found to have positive urinalysis (hazy appearance, large amount of leukocyte, rare bacteria, WBC>50), hyperkalemia with potassium of 5.2, hypercalcemia with calcium 13.0, AKI with creatinine 3.11, BUN 72, negative chest x-ray.  CT head negative for acute intracranial abnormalities.  Patient is placed on telemetry bed for observation.  Assessment & Plan:   Principal Problem:   Acute metabolic encephalopathy Active Problems:   HTN (hypertension)   Dementia (HCC)   UTI (urinary tract infection)   Hyperkalemia   Hypercalcemia   AKI (acute kidney injury) ([place])   Acute metabolic encephalopathy Patient presenting from home with worsening confusion per family members.  Has baseline dementia per sister, but is very mild; and can perform ADLs alone.  Etiology likely multifactorial with significant dehydration,  hypercalcemia, and urinary tract infection.  Calcium elevated at 13.1, urinalysis with negative nitrite, large leukoesterase, greater than 50 WBCs.  Chest x-ray negative for acute disease process.  CT head negative acute findings. --Continue IV fluids, IV antibiotics, supportive care --Continue to monitor on telemetry --Safety sitter  Acute cystitis without hematuria Urinalysis notable for negative nitrite, large leukoesterase and greater than 50 WBCs. --Urine culture: Pending --Blood cultures x2: Pending --Continue ceftriaxone, will de-escalate based on urine culture identification/susceptibilities  Acute renal failure Creatinine noted to be 3.11 on admission.  History of solitary kidney per sister's report.  Last baseline creatinine 0.851-year ago in EMR.  Urine sodium 77, urine creatinine 74.4.  FENa calculated at 2.4%, consistent with intrinsic dysfunction such as ATN.  Also consider dehydration/prerenal azotemia given poor oral intake reported prior to admission.   --Creatinine improving; 3.11-->3.03-->2.53 (baseline 0.85) --hold home ARB and naproxen --Continue IV fluid hydration --Repeat BMP in the a.m. --Strict I's and O's  Hypercalcemia Etiology likely secondary to dehydration.  Patient also is on a calcium supplement at home.  Phosporus normal at 3.7. --s/p 2L NS in ED --s/p pamidronate 90 mg IV x1 --Intact PTH, PTH related protein, 1-25-vitamin D level pending --Holding home calcium/multivitamin supplement --Continue IV fluid hydration with normal saline at 100 mL's per hour --Calcium improved; 13.1--> 11.3 --Repeat BMP in the a.m.  Hyperkalemia Potassium noted to be elevated at 5.2 on admission.  Etiology likely dehydration.  Received dose of Kayexalate 15 g on admission. --Potassium improved, 5.2-->4.3 --Continue IV fluid hydration --Continue to monitor on telemetry --Repeat BMP in a.m.  Essential hypertension --Holding irbesartan due  to renal failure as  above --Hydralazine IV prn  Dementia No current behavioral disturbance.  Very mild per sister's report. --Continue Namenda, donepezil --PT/OT/speech consult    DVT prophylaxis: Heparin Code Status: Full code Family Communication: Discussed with sister, Deneise Lever over telephone today Disposition Plan: Continue inpatient hospitalization   Consultants:   None  Procedures:   None  Antimicrobials:   Ceftriaxone    Subjective: Patient seen and examined at bedside, resting comfortably.  Confused.  States lives with her daughter Marga Melnick; with discussion with sister over the telephone she has passed several years ago.  Sister states that she has baseline dementia that is very mild and able to perform ADLs on her own as she lives alone.  Patient without any other complaints at this time.  Denies headache, no fever/chills/night sweats, no chest pain, no palpitations, no shortness of breath, no abdominal pain, no nausea/vomiting/diarrhea.  No acute events overnight per nursing staff.  Objective: Vitals:   07/06/18 0227 07/06/18 0246 07/06/18 0255 07/06/18 0614  BP: (!) 148/69 (!) 152/68  (!) 163/68  Pulse: 84 77  93  Resp: (!) 22 18  18   Temp:  97.8 F (36.6 C)  97.7 F (36.5 C)  TempSrc:  Oral  Oral  SpO2: 96% (!) 88% 97% 97%    Intake/Output Summary (Last 24 hours) at 07/06/2018 1013 Last data filed at 07/05/2018 2234 Gross per 24 hour  Intake --  Output 0 ml  Net 0 ml   There were no vitals filed for this visit.  Examination:  General exam: Appears calm and comfortable  Respiratory system: Clear to auscultation. Respiratory effort normal. Cardiovascular system: S1 & S2 heard, RRR. No JVD, murmurs, rubs, gallops or clicks. No pedal edema. Gastrointestinal system: Abdomen is nondistended, soft and nontender. No organomegaly or masses felt. Normal bowel sounds heard. Central nervous system: Not oriented to person/place/time, no focal neurological deficits. Extremities:  Symmetric 5 x 5 power. Skin: No rashes, lesions or ulcers Psychiatry: Judgement and insight appear normal. Mood & affect appropriate.     Data Reviewed: I have personally reviewed following labs and imaging studies  CBC: Recent Labs  Lab 07/05/18 2318 07/06/18 0446  WBC 9.4 8.3  NEUTROABS 7.8*  --   HGB 11.7* 12.3  HCT 34.2* 35.7*  MCV 88.4 87.7  PLT 330 161   Basic Metabolic Panel: Recent Labs  Lab 07/05/18 2318 07/06/18 0030 07/06/18 0626  NA 131* 131* 135  K 5.2* 5.4* 4.3  CL 101 100 108  CO2 21* 23 17*  GLUCOSE 128* 124* 98  BUN 72* 71* 63*  CREATININE 3.11* 3.03* 2.53*  CALCIUM 13.0* 13.1* 11.3*  PHOS  --  3.7  --    GFR: CrCl cannot be calculated (Unknown ideal weight.). Liver Function Tests: Recent Labs  Lab 07/05/18 2318  AST 17  ALT 21  ALKPHOS 112  BILITOT 0.4  PROT 7.1  ALBUMIN 3.9   No results for input(s): LIPASE, AMYLASE in the last 168 hours. No results for input(s): AMMONIA in the last 168 hours. Coagulation Profile: No results for input(s): INR, PROTIME in the last 168 hours. Cardiac Enzymes: Recent Labs  Lab 07/05/18 2318  TROPONINI <0.03   BNP (last 3 results) No results for input(s): PROBNP in the last 8760 hours. HbA1C: No results for input(s): HGBA1C in the last 72 hours. CBG: No results for input(s): GLUCAP in the last 168 hours. Lipid Profile: No results for input(s): CHOL, HDL, LDLCALC, TRIG, CHOLHDL, LDLDIRECT in  the last 72 hours. Thyroid Function Tests: No results for input(s): TSH, T4TOTAL, FREET4, T3FREE, THYROIDAB in the last 72 hours. Anemia Panel: No results for input(s): VITAMINB12, FOLATE, FERRITIN, TIBC, IRON, RETICCTPCT in the last 72 hours. Sepsis Labs: No results for input(s): PROCALCITON, LATICACIDVEN in the last 168 hours.  No results found for this or any previous visit (from the past 240 hour(s)).       Radiology Studies: Dg Chest 2 View  Result Date: 07/05/2018 CLINICAL DATA:  Altered mental  status EXAM: CHEST - 2 VIEW COMPARISON:  07/18/2016 FINDINGS: Cardiac shadows within normal limits. The lungs are well aerated bilaterally. No focal infiltrate is noted. Mild aortic calcifications are seen. Degenerative changes of the thoracic spine are noted. IMPRESSION: No active cardiopulmonary disease. Electronically Signed   By: Inez Catalina M.D.   On: 07/05/2018 23:54   Ct Head Wo Contrast  Result Date: 07/05/2018 CLINICAL DATA:  Altered mental status EXAM: CT HEAD WITHOUT CONTRAST TECHNIQUE: Contiguous axial images were obtained from the base of the skull through the vertex without intravenous contrast. COMPARISON:  07/07/2015 FINDINGS: Brain: Chronic atrophic and ischemic changes are identified. No findings to suggest acute hemorrhage, acute infarction or space-occupying mass lesion are seen. Vascular: No hyperdense vessel or unexpected calcification. Skull: Normal. Negative for fracture or focal lesion. Sinuses/Orbits: No acute finding. Other: None. IMPRESSION: Chronic atrophic and ischemic changes without acute abnormality. Electronically Signed   By: Inez Catalina M.D.   On: 07/05/2018 23:57        Scheduled Meds:  aspirin EC  81 mg Oral Daily   donepezil  10 mg Oral QHS   feeding supplement (ENSURE ENLIVE)  237 mL Oral BID BM   heparin  5,000 Units Subcutaneous Q8H   memantine  10 mg Oral BID   omega-3 acid ethyl esters  1 g Oral Daily   oxybutynin  5 mg Oral BID   venlafaxine XR  75 mg Oral Q breakfast   vitamin C  500 mg Oral Daily   vitamin E  200 Units Oral TID   Continuous Infusions:  sodium chloride 100 mL/hr at 07/06/18 0246   cefTRIAXone (ROCEPHIN)  IV     pamidronate 90 mg (07/06/18 0833)     LOS: 0 days    Time spent: 32 minutes    Axyl Sitzman J [place] DO [facility] Pager [phone]  If 7PM-7AM, please contact night-coverage [email or web address] Password [facility] 07/06/2018, 10:13 AM

## 2018-07-06 NOTE — H&P (Addendum)
History and Physical    Shannon Blair ENI:[id number] DOB: Jul 28, 1931 DOA: 07/05/2018  Referring MD/NP/PA:   PCP: Merrilee Seashore, MD   Patient coming from:  The patient is coming from home.  At baseline, pt is independent for most of ADL.        Chief Complaint: Altered mental status, decreased oral intake  HPI: Shannon Blair is a 85 y.o. female with medical history significant of dementia, hypertension, anal cancer 2004, skin squamous cell cancer, varicose veins in leg,who resents with altered mental status, decreased oral intake.  Patient has dementia and AMS. She is unable to provide accurate medical history, therefore, most of the history is obtained by discussing the case with ED physician, per EMS report, and with the nursing staff.   Per report, pt has increased confusion than her baseline and decreased oral intake in the past several days. She was diagnosed with UTI by PCP, and put her on Bactrim on 4/14.  Not sure if patient has symptoms for UTI.  When I saw patient in ED, she is alert, but confused.  She is not orientated x3.  She moves all extremities normally.  No facial droop or slurred speech.  She does not have active nausea, vomiting, diarrhea, cough, respiratory distress.  She denies chest pain or abdominal pain.  ED Course: pt was found to have positive urinalysis (hazy appearance, large amount of leukocyte, rare bacteria, WBC>50), hyperkalemia with potassium of 5.2, hypercalcemia with calcium 13.0, AKI with creatinine 3.11, BUN 72, negative chest x-ray.  CT head negative for acute intracranial abnormalities.  Patient is placed on telemetry bed for observation.  Review of Systems: Could not be reviewed accurately due to dementia and worsening mental status.  Allergy: No Known Allergies  Past Medical History:  Diagnosis Date  . Anal cancer ([place]) 2004   Non-surgical mgt with chemo-radiation.  Dr Alen Blew follows.   . Anal fissure   . Colon, diverticulosis    Sigmoid. With associated stenosis.  Colonoscopy failed to intubate beyond stenosis in 08/2010.  rectal bx then negative for recurrent cancer.   . Dementia ([place])   . Hypertension   . OA (osteoarthritis)   . Squamous cell carcinoma    skin  . Varicose veins of left lower extremity with pain     Past Surgical History:  Procedure Laterality Date  . ABDOMINAL HYSTERECTOMY    . APPENDECTOMY    . BLADDER REPAIR    . CATARACT EXTRACTION  05/2004    dr Ishmael Holter  . LUMBAR LAMINECTOMY/DECOMPRESSION MICRODISCECTOMY N/A 07/26/2016   Procedure: Central decompression, lumbar laminectomy L4-L5 and L5-S1, foraminotmy L5-S1 and L4-L5 bilateral;  Surgeon: Latanya Maudlin, MD;  Location: [facility];  Service: Orthopedics;  Laterality: N/A;  . NEPHRECTOMY  1970's   Rt. "Shrivelled up" ,  no cancer.   [place] POLYPECTOMY      Social History:  reports that she has never smoked. She has never used smokeless tobacco. She reports that she does not drink alcohol or use drugs.  Family History:  Family History  Problem Relation Age of Onset  . Coronary artery disease Father   . Leukemia Brother   . Colon cancer Brother   . Breast cancer Sister   . Ovarian cancer Sister   . Diabetes Daughter      Prior to Admission medications   Medication Sig Start Date End Date Taking? Authorizing Provider  acetaminophen (TYLENOL) 325 MG tablet Take 650 mg by mouth every 6 (six) hours as needed  for mild pain.     [provider]  aspirin EC 81 MG tablet Take 81 mg by mouth daily.    [provider]  calcium carbonate (OS-CAL) 600 MG TABS Take 600 mg by mouth 2 (two) times daily with a meal.      [provider]  cephALEXin (KEFLEX) 500 MG capsule Take 500 mg by mouth 4 (four) times daily. 07/17/16   [provider]  donepezil (ARICEPT) 10 MG tablet Take 10 mg by mouth at bedtime.  09/25/14   [provider]  HYDROcodone-acetaminophen (NORCO/VICODIN) 5-325 MG tablet Take 1 tablet by mouth  every 6 (six) hours as needed (breakthrough pain). 07/27/16   Constable, Amber, PA-C  irbesartan (AVAPRO) 300 MG tablet Take 300 mg by mouth daily.    [provider]  memantine (NAMENDA) 10 MG tablet Take 10 mg by mouth 2 (two) times daily. 04/20/16   [provider]  methocarbamol (ROBAXIN) 500 MG tablet Take 1 tablet (500 mg total) by mouth every 6 (six) hours as needed for muscle spasms. 07/27/16   Constable, Amber, PA-C  Misc Natural Products (OSTEO BI-FLEX ADV JOINT SHIELD PO) Take 1 tablet by mouth daily.      [provider]  Multiple Vitamin (MULITIVITAMIN WITH MINERALS) TABS Take 1 tablet by mouth daily.    [provider]  naproxen sodium (ANAPROX) 220 MG tablet Take 440 mg by mouth 2 (two) times daily as needed (for pain).    [provider]  Omega-3 Fatty Acids (FISH OIL) 1000 MG CAPS Take 1 capsule by mouth daily.      [provider]  oxybutynin (DITROPAN) 5 MG tablet Take 5 mg by mouth 2 (two) times daily. 09/08/14   [provider]  simvastatin (ZOCOR) 40 MG tablet Take 40 mg by mouth daily at 6 PM.     [provider]  venlafaxine (EFFEXOR) 37.5 MG tablet Take 37.5 mg by mouth 2 (two) times daily. 09/05/14   [provider]  vitamin C (ASCORBIC ACID) 500 MG tablet Take 500 mg by mouth daily.      [provider]  vitamin E 200 UNIT capsule Take 200 Units by mouth 3 (three) times daily.      [provider]    Physical Exam: Vitals:   07/05/18 2229 07/05/18 2322  BP: (!) 144/60   Pulse: 84   Resp: 16   Temp: 98 F (36.7 C) 98.4 F (36.9 C)  TempSrc: Oral Rectal  SpO2: 98%    General: Not in acute distress.  Dry mucous membrane HEENT:       Eyes: PERRL, EOMI, no scleral icterus.       ENT: No discharge from the ears and nose, no pharynx injection, no tonsillar enlargement.        Neck: No JVD, no bruit, no mass felt. Heme: No neck lymph node enlargement. Cardiac: S1/S2, RRR,  No murmurs, No gallops or rubs. Respiratory:  No rales, wheezing, rhonchi or rubs. GI: Soft, nondistended, nontender, no rebound pain, no organomegaly, BS present. GU: No hematuria Ext: No pitting leg edema bilaterally. 2+DP/PT pulse bilaterally.  Musculoskeletal: No joint deformities, No joint redness or warmth, no limitation of ROM in spin. Skin: No rashes.  Neuro: Alert, not oriented X3, cranial nerves II-XII grossly intact, moves all extremities normally.  Psych: Patient is not psychotic, no suicidal or hemocidal ideation.  Labs on Admission: I have personally reviewed following labs and imaging studies  CBC:  Recent Labs  Lab 07/05/18 2318  WBC 9.4  NEUTROABS 7.8*  HGB 11.7*  HCT 34.2*  MCV 88.4  PLT 073   Basic Metabolic Panel: Recent Labs  Lab 07/05/18 2318  NA 131*  K 5.2*  CL 101  CO2 21*  GLUCOSE 128*  BUN 72*  CREATININE 3.11*  CALCIUM 13.0*   GFR: CrCl cannot be calculated (Unknown ideal weight.). Liver Function Tests: Recent Labs  Lab 07/05/18 2318  AST 17  ALT 21  ALKPHOS 112  BILITOT 0.4  PROT 7.1  ALBUMIN 3.9   No results for input(s): LIPASE, AMYLASE in the last 168 hours. No results for input(s): AMMONIA in the last 168 hours. Coagulation Profile: No results for input(s): INR, PROTIME in the last 168 hours. Cardiac Enzymes: Recent Labs  Lab 07/05/18 2318  TROPONINI <0.03   BNP (last 3 results) No results for input(s): PROBNP in the last 8760 hours. HbA1C: No results for input(s): HGBA1C in the last 72 hours. CBG: No results for input(s): GLUCAP in the last 168 hours. Lipid Profile: No results for input(s): CHOL, HDL, LDLCALC, TRIG, CHOLHDL, LDLDIRECT in the last 72 hours. Thyroid Function Tests: No results for input(s): TSH, T4TOTAL, FREET4, T3FREE, THYROIDAB in the last 72 hours. Anemia Panel: No results for input(s): VITAMINB12, FOLATE, FERRITIN, TIBC, IRON, RETICCTPCT in the last 72 hours. Urine analysis:    Component Value  Date/Time   COLORURINE YELLOW 07/05/2018 2318   APPEARANCEUR HAZY (A) 07/05/2018 2318   LABSPEC 1.014 07/05/2018 2318   PHURINE 6.0 07/05/2018 2318   GLUCOSEU NEGATIVE 07/05/2018 2318   HGBUR MODERATE (A) 07/05/2018 2318   BILIRUBINUR NEGATIVE 07/05/2018 2318   [place] 07/05/2018 2318   PROTEINUR 30 (A) 07/05/2018 2318   UROBILINOGEN 0.2 06/25/2011 1028   NITRITE NEGATIVE 07/05/2018 2318   LEUKOCYTESUR LARGE (A) 07/05/2018 2318   Sepsis Labs: @LABRCNTIP (procalcitonin:4,lacticidven:4) )No results found for this or any previous visit (from the past 240 hour(s)).   Radiological Exams on Admission: Dg Chest 2 View  Result Date: 07/05/2018 CLINICAL DATA:  Altered mental status EXAM: CHEST - 2 VIEW COMPARISON:  07/18/2016 FINDINGS: Cardiac shadows within normal limits. The lungs are well aerated bilaterally. No focal infiltrate is noted. Mild aortic calcifications are seen. Degenerative changes of the thoracic spine are noted. IMPRESSION: No active cardiopulmonary disease. Electronically Signed   By: Inez Catalina M.D.   On: 07/05/2018 23:54   Ct Head Wo Contrast  Result Date: 07/05/2018 CLINICAL DATA:  Altered mental status EXAM: CT HEAD WITHOUT CONTRAST TECHNIQUE: Contiguous axial images were obtained from the base of the skull through the vertex without intravenous contrast. COMPARISON:  07/07/2015 FINDINGS: Brain: Chronic atrophic and ischemic changes are identified. No findings to suggest acute hemorrhage, acute infarction or space-occupying mass lesion are seen. Vascular: No hyperdense vessel or unexpected calcification. Skull: Normal. Negative for fracture or focal lesion. Sinuses/Orbits: No acute finding. Other: None. IMPRESSION: Chronic atrophic and ischemic changes without acute abnormality. Electronically Signed   By: Inez Catalina M.D.   On: 07/05/2018 23:57     EKG: Independently reviewed.  Sinus rhythm, QTC 462, bifascicular block, poor R wave progression, no T wave  peaking.   Assessment/Plan Principal Problem:   Acute metabolic encephalopathy Active Problems:   HTN (hypertension)   Dementia (HCC)   UTI (urinary tract infection)   Hyperkalemia   Hypercalcemia   AKI (acute kidney injury) ([place])   Acute metabolic encephalopathy:  CT head is negative for acute intracranial abnormalities.  No  focal neurologic findings on physical examination. Likely multifactorial etiology, including UTI, AKI, electrolytes disturbance (hyperkalemia and hypercalcemia). -Placed on telemetry bed for observation -Frequent neuro check - Treat underlying issues as below  HTN (hypertension): -Hold irbesartan due to AKI -IV hydralazine as needed  Dementia: no behavioral disturbance -Namenda, donepezil  UTI (urinary tract infection): -IV Rocephin -Follow-up blood culture and urine culture  Hyperkalemia: K=5.2. -IVF hydration -Kayexalate 15 g x 1  Hypercalcemia: Most likely due to dehydration.  Patient is also taking calcium supplement at home. - check intact PTH, PTH related protein, phosphorus, 1. 25-vitamin D - hold home calcium and multivitamin supplement - IV hydration: 2L NS bolus, then 100 cc/h - f/u by BMP. - will give Pamidronate 90 mg IV 1 dose   AKI: Likely due to UTI and prerenal secondary to dehydration and continuation of ARB and NSAIDs. Pt was treated with Bactrim for UTI, which is partially contributed.  - IVF as above - Follow up renal function by BMP - Check FeNa  - Hold irbesartan and naproxen  DVT ppx: SQ Heparin   Code Status: Full code (pt has dementia, CODE STATUS cannot be discussed with the patient.  Patient was full code in the past, will temporarily put full code order now, this needs to be addressed again by discussing with power of attorney in the morning) Family Communication: None at bed side.   Disposition Plan:  Anticipate discharge back to previous home environment Consults called:  none Admission status: Obs / tele    Date of Service 07/06/2018    [facility]   If 7PM-7AM, please contact night-coverage [email or web address] Password [email or web address] 07/06/2018, 1:17 AM

## 2018-07-07 LAB — BASIC METABOLIC PANEL
Anion gap: 7 (ref 5–15)
BUN: 46 mg/dL — ABNORMAL HIGH (ref 8–23)
CO2: 19 mmol/L — ABNORMAL LOW (ref 22–32)
Calcium: 9.8 mg/dL (ref 8.9–10.3)
Chloride: 113 mmol/L — ABNORMAL HIGH (ref 98–111)
Creatinine, Ser: 1.58 mg/dL — ABNORMAL HIGH (ref 0.44–1.00)
GFR calc Af Amer: 34 mL/min — ABNORMAL LOW (ref >60)
GFR calc non Af Amer: 30 mL/min — ABNORMAL LOW (ref >60)
Glucose, Bld: 75 mg/dL (ref 70–99)
Potassium: 3.6 mmol/L (ref 3.5–5.1)
Sodium: 139 mmol/L (ref 135–145)

## 2018-07-07 LAB — PARATHYROID HORMONE, INTACT (NO CA): PTH: 15 pg/mL (ref 15–65)

## 2018-07-07 NOTE — Progress Notes (Signed)
PROGRESS NOTE    Shannon Blair  GHW:[id number] DOB: 08/26/1931 DOA: 07/05/2018 PCP: Merrilee Seashore, MD    Brief Narrative:  Shannon Blair is a 85 y.o. female with medical history significant of dementia, hypertension, anal cancer 2004, skin squamous cell cancer, varicose veins in leg,who resents with altered mental status, decreased oral intake.  Patient has dementia and AMS. She is unable to provide accurate medical history, therefore, most of the history is obtained by discussing the case with ED physician, per EMS report, and with the nursing staff.   Per report, pt has increased confusion than her baseline and decreased oral intake in the past several days. She was diagnosed with UTI by PCP, and put her on Bactrim on 4/14.  Not sure if patient has symptoms for UTI.  When I saw patient in ED, she is alert, but confused.  She is not orientated x3.  She moves all extremities normally.  No facial droop or slurred speech.  She does not have active nausea, vomiting, diarrhea, cough, respiratory distress.  She denies chest pain or abdominal pain.  ED Course: pt was found to have positive urinalysis (hazy appearance, large amount of leukocyte, rare bacteria, WBC>50), hyperkalemia with potassium of 5.2, hypercalcemia with calcium 13.0, AKI with creatinine 3.11, BUN 72, negative chest x-ray.  CT head negative for acute intracranial abnormalities.  Patient is placed on telemetry bed for observation.  Assessment & Plan:   Principal Problem:   Acute metabolic encephalopathy Active Problems:   HTN (hypertension)   Dementia (HCC)   UTI (urinary tract infection)   Hyperkalemia   Hypercalcemia   AKI (acute kidney injury) ([place])   Acute renal failure (ARF) ([place])   Acute metabolic encephalopathy Patient presenting from home with worsening confusion per family members.  Has baseline dementia per sister, but is very mild; and can perform ADLs alone.  Etiology likely multifactorial with  significant dehydration, hypercalcemia, and urinary tract infection.  Calcium elevated at 13.1, urinalysis with negative nitrite, large leukoesterase, greater than 50 WBCs.  Chest x-ray negative for acute disease process.  CT head negative acute findings. --Continue IV fluids, IV antibiotics, supportive care --Continue to monitor on telemetry --Safety sitter  Acute cystitis without hematuria Urinalysis notable for negative nitrite, large leukoesterase and greater than 50 WBCs. --Urine culture: Pending --Blood cultures x2: no growth x 1 day --Continue ceftriaxone, will de-escalate based on urine culture identification/susceptibilities  Acute renal failure Creatinine noted to be 3.11 on admission.  History of solitary kidney per sister's report.  Last baseline creatinine 0.851-year ago in EMR.  Urine sodium 77, urine creatinine 74.4.  FENa calculated at 2.4%, consistent with intrinsic dysfunction such as ATN.  Also consider dehydration/prerenal azotemia given poor oral intake reported prior to admission.   --Creatinine improving; 3.11-->3.03-->2.53-->1.58 (baseline 0.85) --hold home ARB and naproxen --Continue IV fluid hydration --Repeat BMP in the a.m. --Strict I's and O's  Hypercalcemia Etiology likely secondary to dehydration.  Patient also is on a calcium supplement at home.  Phosporus normal at 3.7. --s/p 2L NS in ED --s/p pamidronate 90 mg IV x1 --Intact PTH, PTH related protein, 1-25-vitamin D level pending --Holding home calcium/multivitamin supplement --Continue IV fluid hydration with normal saline at 100 mL's per hour --Calcium improved; 13.1--> 11.3-->9.8 --Repeat BMP in the a.m.  Hyperkalemia Potassium noted to be elevated at 5.2 on admission.  Etiology likely dehydration.  Received dose of Kayexalate 15 g on admission. --Potassium improved, 5.2-->4.3-->3.6 --Continue IV fluid hydration --Continue to monitor on  telemetry --Repeat BMP in a.m.  Essential  hypertension --Holding irbesartan due to renal failure as above --Hydralazine IV prn  Dementia No current behavioral disturbance.  Very mild per sister's report. --Continue Namenda, donepezil --PT recommends SNF placement --OT consult pending --Speech recommends regular diet with thin liquids with full supervision until mentation improves    DVT prophylaxis: Heparin Code Status: Full code Family Communication: Discussed with sister, Deneise Lever over telephone today Disposition Plan: Continue inpatient hospitalization   Consultants:   None  Procedures:   None  Antimicrobials:   Ceftriaxone 4/18>>>   Subjective: Patient seen and examined at bedside, resting comfortably in bed.  Speech therapy at bedside is assessing swallowing function.  More alert today.  Continues with mild confusion.  Patient without any other complaints at this time.  Denies headache, no fever/chills/night sweats, no chest pain, no palpitations, no shortness of breath, no abdominal pain, no nausea/vomiting/diarrhea.  No acute events overnight per nursing staff.  Objective: Vitals:   07/06/18 0614 07/06/18 1344 07/06/18 2058 07/07/18 0548  BP: (!) 163/68 (!) 149/70 140/66 132/61  Pulse: 93 88 83 70  Resp: 18 16 20 19   Temp: 97.7 F (36.5 C) 99 F (37.2 C) 98.9 F (37.2 C) 98.7 F (37.1 C)  TempSrc: Oral Oral Oral Axillary  SpO2: 97% 97% 97% 99%    Intake/Output Summary (Last 24 hours) at 07/07/2018 1140 Last data filed at 07/07/2018 0600 Gross per 24 hour  Intake 2989.99 ml  Output --  Net 2989.99 ml   There were no vitals filed for this visit.  Examination:  General exam: Appears calm and comfortable  Respiratory system: Clear to auscultation. Respiratory effort normal. Cardiovascular system: S1 & S2 heard, RRR. No JVD, murmurs, rubs, gallops or clicks. No pedal edema. Gastrointestinal system: Abdomen is nondistended, soft and nontender. No organomegaly or masses felt. Normal bowel sounds  heard. Central nervous system: Not oriented to person/place/time, no focal neurological deficits. Extremities: Symmetric 5 x 5 power. Skin: No rashes, lesions or ulcers Psychiatry: Judgement and insight appear normal. Mood & affect appropriate.     Data Reviewed: I have personally reviewed following labs and imaging studies  CBC: Recent Labs  Lab 07/05/18 2318 07/06/18 0446  WBC 9.4 8.3  NEUTROABS 7.8*  --   HGB 11.7* 12.3  HCT 34.2* 35.7*  MCV 88.4 87.7  PLT 330 373   Basic Metabolic Panel: Recent Labs  Lab 07/05/18 2318 07/06/18 0030 07/06/18 0626 07/07/18 0507  NA 131* 131* 135 139  K 5.2* 5.4* 4.3 3.6  CL 101 100 108 113*  CO2 21* 23 17* 19*  GLUCOSE 128* 124* 98 75  BUN 72* 71* 63* 46*  CREATININE 3.11* 3.03* 2.53* 1.58*  CALCIUM 13.0* 13.1* 11.3* 9.8  PHOS  --  3.7  --   --    GFR: CrCl cannot be calculated (Unknown ideal weight.). Liver Function Tests: Recent Labs  Lab 07/05/18 2318  AST 17  ALT 21  ALKPHOS 112  BILITOT 0.4  PROT 7.1  ALBUMIN 3.9   No results for input(s): LIPASE, AMYLASE in the last 168 hours. No results for input(s): AMMONIA in the last 168 hours. Coagulation Profile: No results for input(s): INR, PROTIME in the last 168 hours. Cardiac Enzymes: Recent Labs  Lab 07/05/18 2318  TROPONINI <0.03   BNP (last 3 results) No results for input(s): PROBNP in the last 8760 hours. HbA1C: No results for input(s): HGBA1C in the last 72 hours. CBG: No results for input(s): GLUCAP  in the last 168 hours. Lipid Profile: No results for input(s): CHOL, HDL, LDLCALC, TRIG, CHOLHDL, LDLDIRECT in the last 72 hours. Thyroid Function Tests: No results for input(s): TSH, T4TOTAL, FREET4, T3FREE, THYROIDAB in the last 72 hours. Anemia Panel: No results for input(s): VITAMINB12, FOLATE, FERRITIN, TIBC, IRON, RETICCTPCT in the last 72 hours. Sepsis Labs: No results for input(s): PROCALCITON, LATICACIDVEN in the last 168 hours.  Recent Results  (from the past 240 hour(s))  Urine culture     Status: None (Preliminary result)   Collection Time: 07/05/18 11:18 PM  Result Value Ref Range Status   Specimen Description   Final    URINE, RANDOM Performed at [facility] [address], [place], [place] [address]    Special Requests   Final    NONE Performed at [facility], [place] [address], [place], [place] [address]    Culture   Final    CULTURE REINCUBATED FOR BETTER GROWTH Performed at [facility], [place] [address] [place], [place] [address]    Report Status PENDING  Incomplete  Culture, blood (Routine X 2) w Reflex to ID Panel     Status: None (Preliminary result)   Collection Time: 07/06/18 12:36 AM  Result Value Ref Range Status   Specimen Description   Final    BLOOD RIGHT WRIST Performed at [facility] [address] [place], [place] [address]    Special Requests   Final    BOTTLES DRAWN AEROBIC AND ANAEROBIC Blood Culture adequate volume Performed at [facility] [address], [place], [place] [address]    Culture   Final    NO GROWTH 1 DAY Performed at [facility], [place] [address], [place], [place] [address]    Report Status PENDING  Incomplete  Culture, blood (Routine X 2) w Reflex to ID Panel     Status: None (Preliminary result)   Collection Time: 07/06/18 12:41 AM  Result Value Ref Range Status   Specimen Description   Final    BLOOD LEFT ANTECUBITAL Performed at [facility] [address] [place], [place] [address]    Special Requests   Final    BOTTLES DRAWN AEROBIC AND ANAEROBIC Blood Culture adequate volume Performed at [facility] [address] [place],  [address]    Culture   Final    NO GROWTH 1 DAY Performed at [facility], [place] [address] [place],  [address]    Report Status PENDING  Incomplete         Radiology Studies: Dg  Chest 2 View  Result Date: 07/05/2018 CLINICAL DATA:  Altered mental status EXAM: CHEST - 2 VIEW COMPARISON:  07/18/2016 FINDINGS: Cardiac shadows within normal limits. The lungs are well aerated bilaterally. No focal infiltrate is noted. Mild aortic calcifications are seen. Degenerative changes of the thoracic spine are noted. IMPRESSION: No active cardiopulmonary disease. Electronically Signed   By: Inez Catalina M.D.   On: 07/05/2018 23:54   Ct Head Wo Contrast  Result Date: 07/05/2018 CLINICAL DATA:  Altered mental status EXAM: CT HEAD WITHOUT CONTRAST TECHNIQUE: Contiguous axial images were obtained from the base of the skull through the vertex without intravenous contrast. COMPARISON:  07/07/2015 FINDINGS: Brain: Chronic atrophic and ischemic changes are identified. No findings to suggest acute hemorrhage, acute infarction or space-occupying mass lesion are seen. Vascular: No hyperdense vessel or unexpected calcification. Skull: Normal. Negative for fracture or  focal lesion. Sinuses/Orbits: No acute finding. Other: None. IMPRESSION: Chronic atrophic and ischemic changes without acute abnormality. Electronically Signed   By: Inez Catalina M.D.   On: 07/05/2018 23:57        Scheduled Meds:  aspirin EC  81 mg Oral Daily   donepezil  10 mg Oral QHS   feeding supplement (ENSURE ENLIVE)  237 mL Oral BID BM   heparin  5,000 Units Subcutaneous Q8H   memantine  10 mg Oral BID   omega-3 acid ethyl esters  1 g Oral Daily   oxybutynin  5 mg Oral BID   venlafaxine XR  75 mg Oral Q breakfast   vitamin C  500 mg Oral Daily   vitamin E  200 Units Oral TID   Continuous Infusions:  sodium chloride 100 mL/hr at 07/07/18 0600   cefTRIAXone (ROCEPHIN)  IV Stopped (07/06/18 2253)     LOS: 1 day    Time spent: 28 minutes    Archit Leger J [place] DO [facility] Pager [phone]  If 7PM-7AM, please contact night-coverage [email or web address] Password [email or web address] 07/07/2018, 11:40 AM

## 2018-07-07 NOTE — Evaluation (Signed)
Clinical/Bedside Swallow Evaluation Patient Details  Name: Shannon Blair MRN: [id number] Date of Birth: 25-Feb-1932  Today's Date: 07/07/2018 Time: SLP Start Time (ACUTE ONLY): 0803 SLP Stop Time (ACUTE ONLY): 0840 SLP Time Calculation (min) (ACUTE ONLY): 37 min  Past Medical History:  Past Medical History:  Diagnosis Date  . Anal cancer ([place]) 2004   Non-surgical mgt with chemo-radiation.  Dr Alen Blew follows.   . Anal fissure   . Colon, diverticulosis    Sigmoid. With associated stenosis.  Colonoscopy failed to intubate beyond stenosis in 08/2010.  rectal bx then negative for recurrent cancer.   . Dementia ([place])   . Hypertension   . OA (osteoarthritis)   . Squamous cell carcinoma    skin  . Varicose veins of left lower extremity with pain    Past Surgical History:  Past Surgical History:  Procedure Laterality Date  . ABDOMINAL HYSTERECTOMY    . APPENDECTOMY    . BLADDER REPAIR    . CATARACT EXTRACTION  05/2004    dr Ishmael Holter  . LUMBAR LAMINECTOMY/DECOMPRESSION MICRODISCECTOMY N/A 07/26/2016   Procedure: Central decompression, lumbar laminectomy L4-L5 and L5-S1, foraminotmy L5-S1 and L4-L5 bilateral;  Surgeon: Latanya Maudlin, MD;  Location: [facility];  Service: Orthopedics;  Laterality: N/A;  . NEPHRECTOMY  1970's   Rt. "Shrivelled up" ,  no cancer.   Marland Kitchen POLYPECTOMY     HPI:  Shannon Blair is a 85 y.o. female with medical history significant of dementia, hypertension, anal cancer 2004, skin squamous cell cancer, varicose veins in leg,who resents with altered mental status, decreased oral intake. Has baseline dementia per sister, but is very mild; and can perform ADLs alone.  Etiology likely multifactorial with significant dehydration, hypercalcemia, and urinary tract infection.     Assessment / Plan / Recommendation Clinical Impression  Pt demosntrates impaired, but apparently improving mentation that impacts attention and awareness with PO. She needs verbal cues to initiate sips  with assist. SLP observed her with thin, puree solids and meds with RN. Pt tolerated all intake except for one instance of cough with a sip she was not quite attentive to, with a hard cough response. Pt appears to have intact sensation and good cough strength indicating low risk of silent aspiration. With verbal and tactile cueing pt able to coordinate meds with sips. Recommend a regular diet and thin liquids will full supervision to assist pt as needed until mentation improved enough for her to self feed. RN aware, will sign off.  SLP Visit Diagnosis: Dysphagia, unspecified (R13.10)    Aspiration Risk  Mild aspiration risk;Risk for inadequate nutrition/hydration    Diet Recommendation Regular;Thin liquid   Liquid Administration via: Cup;Straw Medication Administration: Whole meds with liquid Supervision: Staff to assist with self feeding;Full supervision/cueing for compensatory strategies Compensations: Slow rate;Small sips/bites Postural Changes: Seated upright at 90 degrees    Other  Recommendations Oral Care Recommendations: Oral care BID   Follow up Recommendations None      Frequency and Duration            Prognosis        Swallow Study   General HPI: Shannon Blair is a 85 y.o. female with medical history significant of dementia, hypertension, anal cancer 2004, skin squamous cell cancer, varicose veins in leg,who resents with altered mental status, decreased oral intake. Has baseline dementia per sister, but is very mild; and can perform ADLs alone.  Etiology likely multifactorial with significant dehydration, hypercalcemia, and urinary tract infection.  Type of Study: Bedside Swallow Evaluation Previous Swallow Assessment: none Diet Prior to this Study: NPO Temperature Spikes Noted: No Respiratory Status: Room air History of Recent Intubation: No Behavior/Cognition: Alert;Cooperative;Distractible;Requires cueing Oral Cavity Assessment: Within Functional Limits Oral  Care Completed by SLP: Yes Oral Cavity - Dentition: Dentures, top;Dentures, bottom Vision: Functional for self-feeding Self-Feeding Abilities: Needs assist Patient Positioning: Upright in bed Baseline Vocal Quality: Normal Volitional Cough: Strong Volitional Swallow: Able to elicit    Oral/Motor/Sensory Function Overall Oral Motor/Sensory Function: Within functional limits   Ice Chips     Thin Liquid Thin Liquid: Impaired Presentation: Cup;Straw;Self Fed Pharyngeal  Phase Impairments: Cough - Immediate    Nectar Thick Nectar Thick Liquid: Not tested   Honey Thick Honey Thick Liquid: Not tested   Puree Puree: Within functional limits Presentation: Spoon   Solid     Solid: Within functional limits     Herbie Baltimore, MA [place] Pager [phone] Office [phone]  Lynann Beaver 07/07/2018,8:43 AM

## 2018-07-07 NOTE — TOC Initial Note (Signed)
Transition of Care [facility]) - Initial/Assessment Note    Patient Details  Name: Shannon Blair MRN: [id number] Date of Birth: 1931/07/22  Transition of Care [facility]) CM/SW Contact:    Geralynn Ochs, LCSW Phone Number: 07/07/2018, 12:23 PM  Clinical Narrative:   Patient from home with dog, and POA JoAnn and sister Deneise Lever check on her frequently. Arville Go would prefer bringing patient home, and she will provide 24/7 assistance at discharge. Arville Go is hopeful that the patient will improve on antibiotics to be walking before discharge, that she knows she for sure can manage her if she's able to walk, but she will make do either way because she is not sending the patient to a nursing home. Arville Go is aware that dementia is progressive and eventually patient will likely need nursing home placement, but she is not considering it at this time due to the virus. Arville Go agreeable to home health services being set up, prefers either [facility] or [facility]. CSW sent referral to [facility] and they will follow for patient discharge. Patient has a walker at home.                Expected Discharge Plan: [place] Barriers to Discharge: Requiring sitter/restraints, Continued Medical Work up   Patient Goals and CMS Choice Patient states their goals for this hospitalization and ongoing recovery are:: patient confused, unable to participate in goal setting; POA goal is to get patient stabilized and back home CMS Medicare.gov Compare Post Acute Care list provided to:: Patient Represenative (must comment) Choice offered to / list presented to : [facility] POA / Guardian  Expected Discharge Plan and Services Expected Discharge Plan: [place] Choice: [place] arrangements for the past 2 months: Single Family Home Expected Discharge Date: 07/06/18                   HH Arranged: PT, OT, Nurse's Aide HH Agency: [facility]  Prior Living  Arrangements/Services Living arrangements for the past 2 months: [place] Lives with:: Self, Pets Patient language and need for interpreter reviewed:: No Do you feel safe going back to the place where you live?: Yes      Need for Family Participation in Patient Care: Yes (Comment) Care giver support system in place?: Yes (comment)   Criminal Activity/Legal Involvement Pertinent to Current Situation/Hospitalization: No - Comment as needed  Activities of Daily Living Home Assistive Devices/Equipment: Cane (specify quad or straight), Walker (specify type) ADL Screening (condition at time of admission) Patient's cognitive ability adequate to safely complete daily activities?: No Is the patient deaf or have difficulty hearing?: No Does the patient have difficulty seeing, even when wearing glasses/contacts?: No Does the patient have difficulty concentrating, remembering, or making decisions?: Yes Patient able to express need for assistance with ADLs?: Yes Does the patient have difficulty dressing or bathing?: Yes Independently performs ADLs?: Yes (appropriate for developmental age) Does the patient have difficulty walking or climbing stairs?: Yes Weakness of Legs: Both Weakness of Arms/Hands: Both  Permission Sought/Granted                  Emotional Assessment Appearance:: Appears stated age Attitude/Demeanor/Rapport: Unable to Assess Affect (typically observed): Unable to Assess Orientation: : Oriented to Self, Oriented to Place Alcohol / Substance Use: Not Applicable Psych Involvement: No (comment)  Admission diagnosis:  AKI (acute kidney injury) ([place]) [N17.9] Urinary tract infection without hematuria,  site unspecified [N39.0] Altered mental status, unspecified altered mental status type [R41.82] Patient Active Problem List   Diagnosis Date Noted  . UTI (urinary tract infection) 07/06/2018  . Acute metabolic encephalopathy 26/71/2458  . Hyperkalemia 07/06/2018   . Hypercalcemia 07/06/2018  . AKI (acute kidney injury) ([place]) 07/06/2018  . Acute renal failure (ARF) ([place]) 07/06/2018  . Dementia ([place])   . Spinal stenosis, lumbar region with neurogenic claudication 07/26/2016  . Pseudoaphakia 11/13/2011  . Nonspecific (abnormal) findings on radiological and other examination of gastrointestinal tract 06/26/2011  . Colitis 06/25/2011  . Rectal bleeding 06/25/2011  . History of rectal or anal cancer 06/25/2011  . HTN (hypertension) 06/25/2011   PCP:  Merrilee Seashore, MD Pharmacy:   [place], [place] - [address] [place] [address] Phone: [phone] Fax: [phone]     Social Determinants of Health (SDOH) Interventions    Readmission Risk Interventions No flowsheet data found.

## 2018-07-07 NOTE — Progress Notes (Signed)
PT Cancellation Note  Patient Details Name: LILLIE PORTNER MRN: [id number] DOB: 07-Feb-1932   Cancelled Treatment:    Reason Eval/Treat Not Completed: Fatigue/lethargy limiting ability to participate Pt just back to bed with nursing.   Toan Mort,KATHrine E 07/07/2018, 2:49 PM [place], DPT Acute Rehabilitation Services Office: [phone] Pager: [phone]

## 2018-07-07 NOTE — Progress Notes (Signed)
Initial Nutrition Assessment  INTERVENTION:   Continue Ensure Enlive po BID, each supplement provides 350 kcal and 20 grams of protein  NUTRITION DIAGNOSIS:   Inadequate oral intake related to poor appetite as evidenced by per patient/family report.  GOAL:   Patient will meet greater than or equal to 90% of their needs  MONITOR:   PO intake, Supplement acceptance, Labs, Weight trends, I & O's  REASON FOR ASSESSMENT:   Malnutrition Screening Tool    ASSESSMENT:    85 y.o. female with medical history significant of dementia, hypertension, anal cancer 2004, skin squamous cell cancer, varicose veins in leg,who resents with altered mental status, decreased oral intake.  **RD working remotely**  Patient with AMS and confusion. Pt was evaluated by SLP who recommends regular diet for patient with full supervision given mild aspiration risk. Pt has been ordered Ensure supplements and she is drinking them today. Per  Chart review, pt had decreased intakes 4 days PTA given mental status changes.   No weight measured for this admission. Unable to assess current weight status and calculate needs without a recent weight.   Medications: Lovaza capsule daily, Vitamin C tablet daily, Vitamin E capsule TID, IV Zofran PRN Labs reviewed: GFR: 30  NUTRITION - FOCUSED PHYSICAL EXAM:  Unable to perform per department requirements to work remotely.  Diet Order:   Diet Order            Diet regular Room service appropriate? Yes with Assist; Fluid consistency: Thin  Diet effective now              EDUCATION NEEDS:   Not appropriate for education at this time  Skin:  Skin Assessment: Reviewed RN Assessment  Last BM:  4/19  Height:   Ht Readings from Last 1 Encounters:  07/26/16 5\' 2"  (1.575 m)    Weight:   Wt Readings from Last 1 Encounters:  07/26/16 63.5 kg    Ideal Body Weight:  50 kg  BMI:  There is no height or weight on file to calculate BMI.  Estimated  Nutritional Needs:   Kcal:  Unable to calculate  Protein:   "  Fluid:   "  Clayton Bibles, MS, RD, LDN [place] Dietitian Pager: [phone] After Hours Pager: [phone]

## 2018-07-07 NOTE — Evaluation (Signed)
Occupational Therapy Evaluation Patient Details Name: Shannon Blair MRN: [id number] DOB: [birth year] Today's Date: 07/07/2018    History of Present Illness 85 y.o. female with medical history significant of dementia, hypertension, anal cancer 2004, skin squamous cell cancer, varicose veins in leg, and admitted for acute metabolic encephalopathy and Acute cystitis without hematuria   Clinical Impression   Pt admitted with the above. Pt currently with functional limitations due to the deficits listed below (see OT Problem List).  Pt will benefit from skilled OT to increase their safety and independence with ADL and functional mobility for ADL to facilitate discharge to venue listed below.      Follow Up Recommendations  SNF;Supervision/Assistance - 24 hour    Equipment Recommendations  None recommended by OT    Recommendations for Other Services       Precautions / Restrictions Precautions Precautions: Fall Precaution Comments: incontinent at this time      Mobility Bed Mobility Overal bed mobility: Needs Assistance Bed Mobility: Supine to Sit     Supine to sit: Mod assist        Transfers Overall transfer level: Needs assistance Equipment used: Rolling walker (2 wheeled) Transfers: Sit to/from [facility] Sit to Stand: Mod assist Stand pivot transfers: Mod assist       General transfer comment: 1 person hand held A    Balance Overall balance assessment: Needs assistance         Standing balance support: Bilateral upper extremity supported Standing balance-Leahy Scale: Zero                             ADL either performed or assessed with clinical judgement   ADL Overall ADL's : Needs assistance/impaired Eating/Feeding: Set up;Sitting   Grooming: Sitting;Minimal assistance   Upper Body Bathing: Minimal assistance;Sitting   Lower Body Bathing: Maximal assistance;Sit to/from stand;Cueing for sequencing;Cueing for safety    Upper Body Dressing : Minimal assistance;Sitting   Lower Body Dressing: Moderate assistance;Sit to/from stand;Cueing for sequencing;Cueing for safety   Toilet Transfer: Comfort height toilet;Cueing for safety;Cueing for sequencing;Maximal assistance;Stand-pivot Toilet Transfer Details (indicate cue type and reason): one person hand held A Toileting- Clothing Manipulation and Hygiene: Moderate assistance;Sit to/from stand;Cueing for sequencing;Cueing for safety       Functional mobility during ADLs: Cueing for safety;Cueing for sequencing;Maximal assistance       Vision Patient Visual Report: No change from baseline              Pertinent Vitals/Pain Pain Assessment: No/denies pain     Hand Dominance     Extremity/Trunk Assessment Upper Extremity Assessment Upper Extremity Assessment: Generalized weakness           Communication Communication Communication: No difficulties   Cognition Arousal/Alertness: Awake/alert Behavior During Therapy: WFL for tasks assessed/performed Overall Cognitive Status: No family/caregiver present to determine baseline cognitive functioning                                 General Comments: mild dementia per chart, pt with slow processing and requiring safety cues   General Comments               Home Living Family/patient expects to be discharged to:: Private residence Living Arrangements: Alone Available Help at Discharge: Family Type of Home: House Home Access: Stairs to enter [employer] of Steps: 1   Home Layout: Able  to live on main level with bedroom/bathroom     Bathroom Shower/Tub: Tub/shower unit         Home Equipment: Environmental consultant - 2 wheels;Cane - single point   Additional Comments: above per previous admission, pt reports living alone, unable to recall stairs, states sister checks in      Prior Functioning/Environment Level of Independence: Independent  Gait / Transfers Assistance  Needed: per chart review, typically independent, pt reports occasional use of assistive devices              OT Problem List: Decreased strength;Decreased safety awareness;Decreased activity tolerance;Decreased knowledge of precautions;Decreased knowledge of use of DME or AE;Impaired balance (sitting and/or standing)      OT Treatment/Interventions: Self-care/ADL training;Patient/family education    OT Goals(Current goals can be found in the care plan section) Acute Rehab OT Goals Patient Stated Goal: did not state OT Goal Formulation: With patient Time For Goal Achievement: 07/14/18 Potential to Achieve Goals: Good  OT Frequency: Min 2X/week              AM-PAC OT "6 Clicks" Daily Activity     Outcome Measure Help from another person eating meals?: A Little Help from another person taking care of personal grooming?: A Little Help from another person toileting, which includes using toliet, bedpan, or urinal?: A Lot Help from another person bathing (including washing, rinsing, drying)?: Total Help from another person to put on and taking off regular upper body clothing?: A Lot Help from another person to put on and taking off regular lower body clothing?: Total 6 Click Score: 12   End of Session Equipment Utilized During Treatment: Gait belt Nurse Communication: Mobility status  Activity Tolerance: Patient limited by fatigue Patient left: in chair;with call bell/phone within reach;with chair alarm set  OT Visit Diagnosis: Unsteadiness on feet (R26.81);Other abnormalities of gait and mobility (R26.89);Repeated falls (R29.6);Muscle weakness (generalized) (M62.81);History of falling (Z91.81)                Time: 7672-0947 OT Time Calculation (min): 15 min Charges:  OT General Charges $OT Visit: 1 Visit OT Evaluation $OT Eval Moderate Complexity: 1 Mod  Kari Baars, [place] [phone] Office- [phone]     Denelda Akerley, Edwena Felty  D 07/07/2018, 1:48 PM

## 2018-07-08 LAB — URINE CULTURE: Culture: 50000 — AB

## 2018-07-08 LAB — BASIC METABOLIC PANEL
Anion gap: 3 — ABNORMAL LOW (ref 5–15)
BUN: 35 mg/dL — ABNORMAL HIGH (ref 8–23)
CO2: 21 mmol/L — ABNORMAL LOW (ref 22–32)
Calcium: 9.4 mg/dL (ref 8.9–10.3)
Chloride: 117 mmol/L — ABNORMAL HIGH (ref 98–111)
Creatinine, Ser: 1.28 mg/dL — ABNORMAL HIGH (ref 0.44–1.00)
GFR calc Af Amer: 44 mL/min — ABNORMAL LOW (ref >60)
GFR calc non Af Amer: 38 mL/min — ABNORMAL LOW (ref >60)
Glucose, Bld: 93 mg/dL (ref 70–99)
Potassium: 4 mmol/L (ref 3.5–5.1)
Sodium: 141 mmol/L (ref 135–145)

## 2018-07-08 LAB — CBC
HCT: 27.3 % — ABNORMAL LOW (ref 36.0–46.0)
Hemoglobin: 8.8 g/dL — ABNORMAL LOW (ref 12.0–15.0)
MCH: 29.6 pg (ref 26.0–34.0)
MCHC: 32.2 g/dL (ref 30.0–36.0)
MCV: 91.9 fL (ref 80.0–100.0)
Platelets: 192 10*3/uL (ref 150–400)
RBC: 2.97 MIL/uL — ABNORMAL LOW (ref 3.87–5.11)
RDW: 13.3 % (ref 11.5–15.5)
WBC: 4.8 10*3/uL (ref 4.0–10.5)
nRBC: 0 % (ref 0.0–0.2)

## 2018-07-08 LAB — MAGNESIUM: Magnesium: 1.9 mg/dL (ref 1.7–2.4)

## 2018-07-08 LAB — CALCITRIOL (1,25 DI-OH VIT D): Vit D, 1,25-Dihydroxy: 32 pg/mL (ref 19.9–79.3)

## 2018-07-08 MED ORDER — CEPHALEXIN 500 MG PO CAPS: 500.0000 mg | ORAL_CAPSULE | Freq: Two times a day (BID) | ORAL | 0 refills | Status: AC

## 2018-07-08 NOTE — Discharge Summary (Signed)
Physician Discharge Summary  SUNDEEP CARY GHW:[id number] DOB: 1931-09-08 DOA: 07/05/2018  PCP: Merrilee Seashore, MD  Admit date: 07/05/2018 Discharge date: 07/08/2018  Admitted From: Home Disposition: Home Recommendations for Outpatient Follow-up:  1. Follow up with PCP in 1 week 2. Please obtain BMP, monitor calcium level and ensure resolution of renal failure 3. Home calcium supplement held due to presenting symptoms with hypercalcemia  Home Health: Yes, RN, aide, PT Equipment/Devices: none  Discharge Condition: Stable CODE STATUS: Full code Diet recommendation: Heart Healthy   History of present illness:  Shannon Blair a 85 y.o.femalewith medical history significant ofdementia, hypertension, anal cancer 2004, skin squamous cell cancer, varicose veins in leg,who resents with altered mental status, decreased oral intake.  Patient hasdementia and AMS. Sheis unable to provide accurate medical history, therefore, most of the history is obtained by discussing the case with ED physician, per EMS report, and with the nursing staff.  Per report, pt has increased confusionthan her baselineanddecreasedoral intake in the past several days. She wasdiagnosed with UTI by PCP, and putheron Bactrim on 4/14. Not sure if patient has symptoms for UTI. When I sawpatient in ED, she is alert,but confused. She is not orientated x3. She moves all extremities normally. No facial droop or slurred speech. She does not have active nausea,vomiting, diarrhea, cough, respiratory distress. She denies chest pain or abdominal pain.  ED Course:pt was found to have positive urinalysis (hazy appearance, large amount of leukocyte, rare bacteria, WBC>50), hyperkalemia with potassium of 5.2, hypercalcemia with calcium 13.0, AKI with creatinine 3.11, BUN 72, negative chest x-ray. CT head negative for acute intracranial abnormalities. Patient is placed on telemetry bed for  observation.  Hospital course:  Acute metabolic encephalopathy Patient presenting from home with worsening confusion per family members.  Has baseline dementia per sister, but is very mild; and can perform ADLs alone.  Etiology likely multifactorial with significant dehydration, hypercalcemia, and urinary tract infection.  Calcium elevated at 13.1, urinalysis with negative nitrite, large leukoesterase, greater than 50 WBCs.  Chest x-ray negative for acute disease process.  CT head negative acute findings.  Patient was started on IV fluid hydration, IV antibiotics and received a dose of IV pamidronate for hypercalcemia.  Patient's mental status slowly improved with return to baseline at time of discharge.  Patient was seen by physical therapy and Occupational Therapy and deemed a high fall risk and needs 24-hour home support and will have home health PT/RN/aide for assistance.  Acute cystitis without hematuria E. coli UTI Urinalysis notable for negative nitrite, large leukoesterase and greater than 50 WBCs.  Patient was started on ceftriaxone.  Urine culture notable for E. coli with resistance to Bactrim.  Patient will continue additional 3 days of oral antibiotics with Keflex following discharge.  Acute renal failure Creatinine noted to be 3.11 on admission.  History of solitary kidney per sister's report.  Last baseline creatinine 0.85, one year ago in EMR.  Urine sodium 77, urine creatinine 74.4.  FENa calculated at 2.4%, consistent with intrinsic dysfunction such as ATN.  Also consider dehydration/prerenal azotemia given poor oral intake reported prior to admission.    Patient's home ARB and naproxen were held on admission.  She was supported with IV fluid hydration and IV antibiotics as above.  Patient's creatinine improved and was 1.28 at time of discharge.  Recommend repeat BMP at PCP visit to ensure resolution of renal failure.  Hypercalcemia: Resolved Etiology likely secondary to  dehydration.  Patient also is on a calcium supplement  at home.  Phosporus normal at 3.7.  Patient received 2 L IV normal saline bolus in ED and was given pamidronate 90 mg IV x1 by admitting provider.  Her home calcium and multivitamin were held during the admission.  Patient's calcium improved to 0.4 at time of discharge.  Recommend repeat BMP at PCP visit.  Hyperkalemia: Resolved Potassium noted to be elevated at 5.2 on admission.  Etiology likely dehydration.  Received dose of Kayexalate 15 g on admission.  Patient was supported with IV fluid hydration with resolution of her hyper kalemia with potassium 4.0 at time of discharge.  Essential hypertension Resume home irbesartan following discharge  Dementia No current behavioral disturbance.  Very mild per sister's report. Continue Namenda, donepezil.  Patient will discharge home with home health services with sisters support.  Discharge Diagnoses:  Active Problems:   HTN (hypertension)   Dementia (HCC)   UTI (urinary tract infection)    Discharge Instructions  Discharge Instructions    Call MD for:  difficulty breathing, headache or visual disturbances   Complete by:  As directed    Call MD for:  persistant dizziness or light-headedness   Complete by:  As directed    Call MD for:  persistant nausea and vomiting   Complete by:  As directed    Call MD for:  severe uncontrolled pain   Complete by:  As directed    Call MD for:  temperature >100.4   Complete by:  As directed    Diet - low sodium heart healthy   Complete by:  As directed    Increase activity slowly   Complete by:  As directed      Allergies as of 07/08/2018   No Known Allergies     Medication List    STOP taking these medications   calcium carbonate 600 MG Tabs tablet Commonly known as:  OS-CAL   sulfamethoxazole-trimethoprim 800-160 MG tablet Commonly known as:  BACTRIM DS     TAKE these medications   acetaminophen 325 MG tablet Commonly known as:   TYLENOL Take 650 mg by mouth every 6 (six) hours as needed for mild pain.   aspirin EC 81 MG tablet Take 81 mg by mouth daily.   cephALEXin 500 MG capsule Commonly known as:  KEFLEX Take 1 capsule (500 mg total) by mouth 2 (two) times daily for 3 days.   donepezil 10 MG tablet Commonly known as:  ARICEPT Take 10 mg by mouth at bedtime.   Fish Oil 1000 MG Caps Take 1 capsule by mouth daily.   irbesartan-hydrochlorothiazide 300-12.5 MG tablet Commonly known as:  AVALIDE Take 1 tablet by mouth daily.   memantine 10 MG tablet Commonly known as:  NAMENDA Take 10 mg by mouth 2 (two) times daily.   multivitamin with minerals Tabs tablet Take 1 tablet by mouth daily.   naproxen sodium 220 MG tablet Commonly known as:  ALEVE Take 440 mg by mouth 2 (two) times daily as needed (for pain).   OSTEO BI-FLEX ADV JOINT SHIELD PO Take 1 tablet by mouth daily.   oxybutynin 5 MG tablet Commonly known as:  DITROPAN Take 5 mg by mouth 2 (two) times daily.   venlafaxine XR 75 MG 24 hr capsule Commonly known as:  EFFEXOR-XR Take 75 mg by mouth daily with breakfast.   vitamin C 500 MG tablet Commonly known as:  ASCORBIC ACID Take 500 mg by mouth daily.   vitamin E 200 UNIT capsule Take 200 Units by  mouth 3 (three) times daily.      Follow-up Information    Merrilee Seashore, MD. Call in 1 week(s).   Specialty:  Internal Medicine Contact information: [place] [address] [phone]          No Known Allergies  Consultations:  none   Procedures/Studies: Dg Chest 2 View  Result Date: 07/05/2018 CLINICAL DATA:  Altered mental status EXAM: CHEST - 2 VIEW COMPARISON:  07/18/2016 FINDINGS: Cardiac shadows within normal limits. The lungs are well aerated bilaterally. No focal infiltrate is noted. Mild aortic calcifications are seen. Degenerative changes of the thoracic spine are noted. IMPRESSION: No active cardiopulmonary disease.  Electronically Signed   By: Inez Catalina M.D.   On: 07/05/2018 23:54   Ct Head Wo Contrast  Result Date: 07/05/2018 CLINICAL DATA:  Altered mental status EXAM: CT HEAD WITHOUT CONTRAST TECHNIQUE: Contiguous axial images were obtained from the base of the skull through the vertex without intravenous contrast. COMPARISON:  07/07/2015 FINDINGS: Brain: Chronic atrophic and ischemic changes are identified. No findings to suggest acute hemorrhage, acute infarction or space-occupying mass lesion are seen. Vascular: No hyperdense vessel or unexpected calcification. Skull: Normal. Negative for fracture or focal lesion. Sinuses/Orbits: No acute finding. Other: None. IMPRESSION: Chronic atrophic and ischemic changes without acute abnormality. Electronically Signed   By: Inez Catalina M.D.   On: 07/05/2018 23:57      Subjective: Patient seen and examined at bedside, no complaints this morning.  Appears back to her normal baseline.  Has some mild weakness with PT/OT recommending home health therapy.  Ready for discharge home, sister anticipating her return home.  No other complaints at this time.  Denies headache, no fever/chills/night sweats, no nausea/vomiting/diarrhea, no chest pain, no palpitations, no shortness of breath, no abdominal pain.  No acute events overnight per nursing staff.   Discharge Exam: Vitals:   07/07/18 2009 07/08/18 0625  BP: (!) 138/56 (!) 124/53  Pulse: 84 73  Resp:  20  Temp:  98.3 F (36.8 C)  SpO2: 97% 93%   Vitals:   07/07/18 1443 07/07/18 2007 07/07/18 2009 07/08/18 0625  BP: (!) 120/54 (!) 142/62 (!) 138/56 (!) 124/53  Pulse: 72 83 84 73  Resp: 18 20  20   Temp: 98.7 F (37.1 C) 98.8 F (37.1 C)  98.3 F (36.8 C)  TempSrc: Oral   Oral  SpO2: 99% 98% 97% 93%    General: Pt is alert, awake, not in acute distress Cardiovascular: RRR, S1/S2 +, no rubs, no gallops Respiratory: CTA bilaterally, no wheezing, no rhonchi Abdominal: Soft, NT, ND, bowel sounds  + Extremities: no edema, no cyanosis    The results of significant diagnostics from this hospitalization (including imaging, microbiology, ancillary and laboratory) are listed below for reference.     Microbiology: Recent Results (from the past 240 hour(s))  Urine culture     Status: Abnormal   Collection Time: 07/05/18 11:18 PM  Result Value Ref Range Status   Specimen Description   Final    URINE, RANDOM Performed at [place] [address] [place], [place] [address]    Special Requests   Final    NONE Performed at [facility], [place] [address], [place], [place] [address]    Culture 50,000 COLONIES/mL ESCHERICHIA COLI (A)  Final   Report Status 07/08/2018 FINAL  Final   Organism ID, Bacteria ESCHERICHIA COLI (A)  Final      Susceptibility   Escherichia  coli - MIC*    AMPICILLIN 8 SENSITIVE Sensitive     CEFAZOLIN <=4 SENSITIVE Sensitive     CEFTRIAXONE <=1 SENSITIVE Sensitive     CIPROFLOXACIN 1 SENSITIVE Sensitive     GENTAMICIN <=1 SENSITIVE Sensitive     IMIPENEM <=0.25 SENSITIVE Sensitive     NITROFURANTOIN <=16 SENSITIVE Sensitive     TRIMETH/SULFA >=320 RESISTANT Resistant     AMPICILLIN/SULBACTAM <=2 SENSITIVE Sensitive     PIP/TAZO <=4 SENSITIVE Sensitive     Extended ESBL NEGATIVE Sensitive     * 50,000 COLONIES/mL ESCHERICHIA COLI  Culture, blood (Routine X 2) w Reflex to ID Panel     Status: None (Preliminary result)   Collection Time: 07/06/18 12:36 AM  Result Value Ref Range Status   Specimen Description   Final    BLOOD RIGHT WRIST Performed at [facility], [address], [place], [place] [address]    Special Requests   Final    BOTTLES DRAWN AEROBIC AND ANAEROBIC Blood Culture adequate volume Performed at [place] [address] [place], [place] [address]    Culture   Final    NO GROWTH 1 DAY Performed at [facility], [place] [address] [place], [place] [address]     Report Status PENDING  Incomplete  Culture, blood (Routine X 2) w Reflex to ID Panel     Status: None (Preliminary result)   Collection Time: 07/06/18 12:41 AM  Result Value Ref Range Status   Specimen Description   Final    BLOOD LEFT ANTECUBITAL Performed at [facility] [address] [place], [place] [address]    Special Requests   Final    BOTTLES DRAWN AEROBIC AND ANAEROBIC Blood Culture adequate volume Performed at [place] [facility] [address], [place], [place] [address]    Culture   Final    NO GROWTH 1 DAY Performed at [facility], [place] [address], [place],  [address]    Report Status PENDING  Incomplete     Labs: BNP (last 3 results) No results for input(s): BNP in the last 8760 hours. Basic Metabolic Panel: Recent Labs  Lab 07/05/18 2318 07/06/18 0030 07/06/18 0626 07/07/18 0507 07/08/18 0638  NA 131* 131* 135 139 141  K 5.2* 5.4* 4.3 3.6 4.0  CL 101 100 108 113* 117*  CO2 21* 23 17* 19* 21*  GLUCOSE 128* 124* 98 75 93  BUN 72* 71* 63* 46* 35*  CREATININE 3.11* 3.03* 2.53* 1.58* 1.28*  CALCIUM 13.0* 13.1* 11.3* 9.8 9.4  MG  --   --   --   --  1.9  PHOS  --  3.7  --   --   --    Liver Function Tests: Recent Labs  Lab 07/05/18 2318  AST 17  ALT 21  ALKPHOS 112  BILITOT 0.4  PROT 7.1  ALBUMIN 3.9   No results for input(s): LIPASE, AMYLASE in the last 168 hours. No results for input(s): AMMONIA in the last 168 hours. CBC: Recent Labs  Lab 07/05/18 2318 07/06/18 0446 07/08/18 0638  WBC 9.4 8.3 4.8  NEUTROABS 7.8*  --   --   HGB 11.7* 12.3 8.8*  HCT 34.2* 35.7* 27.3*  MCV 88.4 87.7 91.9  PLT 330 242 192   Cardiac Enzymes: Recent Labs  Lab 07/05/18 2318  TROPONINI <0.03   BNP: Invalid input(s): POCBNP CBG: No results for input(s): GLUCAP in the last 168  hours. D-Dimer No results for input(s): DDIMER in the last 72 hours. Hgb A1c No results for input(s): HGBA1C in the last 72  hours. Lipid Profile No results for input(s): CHOL, HDL, LDLCALC, TRIG, CHOLHDL, LDLDIRECT in the last 72 hours. Thyroid function studies No results for input(s): TSH, T4TOTAL, T3FREE, THYROIDAB in the last 72 hours.  Invalid input(s): FREET3 Anemia work up No results for input(s): VITAMINB12, FOLATE, FERRITIN, TIBC, IRON, RETICCTPCT in the last 72 hours. Urinalysis    Component Value Date/Time   COLORURINE YELLOW 07/05/2018 2318   APPEARANCEUR HAZY (A) 07/05/2018 2318   LABSPEC 1.014 07/05/2018 2318   PHURINE 6.0 07/05/2018 2318   GLUCOSEU NEGATIVE 07/05/2018 2318   HGBUR MODERATE (A) 07/05/2018 2318   [place] NEGATIVE 07/05/2018 2318   [place] 07/05/2018 2318   PROTEINUR 30 (A) 07/05/2018 2318   UROBILINOGEN 0.2 06/25/2011 1028   NITRITE NEGATIVE 07/05/2018 2318   LEUKOCYTESUR LARGE (A) 07/05/2018 2318   Sepsis Labs Invalid input(s): PROCALCITONIN,  WBC,  LACTICIDVEN Microbiology Recent Results (from the past 240 hour(s))  Urine culture     Status: Abnormal   Collection Time: 07/05/18 11:18 PM  Result Value Ref Range Status   Specimen Description   Final    URINE, RANDOM Performed at [facility], [place] [address], [place], [place] [address]    Special Requests   Final    NONE Performed at [facility], [employer] [address] [place], [place] [address]    Culture 50,000 COLONIES/mL ESCHERICHIA COLI (A)  Final   Report Status 07/08/2018 FINAL  Final   Organism ID, Bacteria ESCHERICHIA COLI (A)  Final      Susceptibility   Escherichia coli - MIC*    AMPICILLIN 8 SENSITIVE Sensitive     CEFAZOLIN <=4 SENSITIVE Sensitive     CEFTRIAXONE <=1 SENSITIVE Sensitive     CIPROFLOXACIN 1 SENSITIVE Sensitive     GENTAMICIN <=1 SENSITIVE Sensitive     IMIPENEM <=0.25 SENSITIVE Sensitive     NITROFURANTOIN <=16 SENSITIVE Sensitive     TRIMETH/SULFA >=320 RESISTANT Resistant     AMPICILLIN/SULBACTAM <=2 SENSITIVE Sensitive      PIP/TAZO <=4 SENSITIVE Sensitive     Extended ESBL NEGATIVE Sensitive     * 50,000 COLONIES/mL ESCHERICHIA COLI  Culture, blood (Routine X 2) w Reflex to ID Panel     Status: None (Preliminary result)   Collection Time: 07/06/18 12:36 AM  Result Value Ref Range Status   Specimen Description   Final    BLOOD RIGHT WRIST Performed at [facility], [address] [place], [place] [address]    Special Requests   Final    BOTTLES DRAWN AEROBIC AND ANAEROBIC Blood Culture adequate volume Performed at [place] [address] [place], [place] [address]    Culture   Final    NO GROWTH 1 DAY Performed at [facility], [place] [address] [place], [place] [address]    Report Status PENDING  Incomplete  Culture, blood (Routine X 2) w Reflex to ID Panel     Status: None (Preliminary result)   Collection Time: 07/06/18 12:41 AM  Result Value Ref Range Status   Specimen Description   Final    BLOOD LEFT ANTECUBITAL Performed at [place] [address], [place], [place] [address]    Special Requests   Final    BOTTLES DRAWN AEROBIC AND ANAEROBIC Blood Culture adequate volume Performed at [facility], [place] [address] [place], [place] [address]    Culture   Final    NO GROWTH 1 DAY Performed at [facility], [place] [address], [place], [place] [address]    Report Status PENDING  Incomplete     Time coordinating discharge: Over 30 minutes  SIGNED:   Donnamarie Poag [place] DO  [facility] 07/08/2018, 9:54 AM

## 2018-07-08 NOTE — Progress Notes (Signed)
Physical Therapy Treatment Patient Details Name: Shannon Blair MRN: [id number] DOB: 04/11/1931 Today's Date: 07/08/2018    History of Present Illness 85 y.o. female with medical history significant of dementia, hypertension, anal cancer 2004, skin squamous cell cancer, varicose veins in leg, and admitted for acute metabolic encephalopathy and Acute cystitis without hematuria    PT Comments    Pt pleasant but remains moderately confused.  OOB in recliner self feeding.  Pt asked for sugar for her tea.  When given she started to put sugar in her large water pitcher.  Required redirection.  Assisted out of recliner pt required 75% VC's on proper tech and was present with Mod posterior lean.  Unsteady.  Upond standing, pt was incont urine and unaware.  Assisted to amb to bathroom required Min Assist and 75% VC's for direction esp with turns.  Near fall in bathroom attepmting to sit on toilet before she was close enough. Required assist for peri care and donning new underpants/pad.   Near fall rising from toilet present with posterior LOB and poor/delayed correction reaction.  Poor standing balance at sink with hips and knees flexed and tendency to fall backward.  General Gait Details: poor dynamaic balance esp with turns.  Impaired safety awareness when attempted to sit in recliner prior to turn completion.  Pleasantly confused and required 50% direction on task.   Pt plans to return home with 24/7 care.  Pt will need assist with ALL mobility.  HIGH FALL RISK.    Follow Up Recommendations  SNF;Supervision/Assistance - 24 hour(per chart review pt plans to return home with 24/7 care )     Equipment Recommendations  None recommended by PT(has a rollator at home)    Recommendations for Other Services       Precautions / Restrictions Precautions Precautions: Fall Precaution Comments:    Restrictions Weight Bearing Restrictions: No    Mobility  Bed Mobility Overal bed mobility: Needs  Assistance Bed Mobility: Supine to Sit     Supine to sit: Supervision     General bed mobility comments: OOB in recliner   Transfers Overall transfer level: Needs assistance Equipment used: Rolling walker (2 wheeled) Transfers: Sit to/from [facility] Sit to Stand: Min assist Stand pivot transfers: Min assist       General transfer comment: required 75% VC's for direction, turn completion and safety.  Near fall in bathroom attepmting to sit on toilet before she was close enough.  Near fall rising from toilet present with posterior LOB and poor/delayed correction reaction.  Poor standing balance at sink with hips and knees flexed and tendency to fall backward.    Ambulation/Gait Ambulation/Gait assistance: Min assist Gait Distance (Feet): 35 Feet Assistive device: Rolling walker (2 wheeled) Gait Pattern/deviations: Step-through pattern;Decreased stride length;Narrow base of support;Trunk flexed;Drifts right/left     General Gait Details: poor dynamaic balance esp with turns.  Impaired safety awareness when attempted to sit in recliner prior to turn completion.  Pleasantly confused and required 50% direction on task.     Stairs             Wheelchair Mobility    Modified Rankin (Stroke Patients Only)       Balance Overall balance assessment: Needs assistance Sitting-balance support: Feet supported Sitting balance-Leahy Scale: Good     Standing balance support: Bilateral upper extremity supported Standing balance-Leahy Scale: Fair  Cognition Arousal/Alertness: Awake/alert Behavior During Therapy: WFL for tasks assessed/performed Overall Cognitive Status: No family/caregiver present to determine baseline cognitive functioning                                 General Comments: impaired safety judgement and memory.       Exercises      General Comments        Pertinent Vitals/Pain  Pain Assessment: No/denies pain    Home Living                      Prior Function            PT Goals (current goals can now be found in the care plan section) Progress towards PT goals: Progressing toward goals    Frequency    Min 3X/week      PT Plan Current plan remains appropriate    Co-evaluation              AM-PAC PT "6 Clicks" Mobility   Outcome Measure  Help needed turning from your back to your side while in a flat bed without using bedrails?: A Little Help needed moving from lying on your back to sitting on the side of a flat bed without using bedrails?: A Little Help needed moving to and from a bed to a chair (including a wheelchair)?: A Little Help needed standing up from a chair using your arms (e.g., wheelchair or bedside chair)?: A Little Help needed to walk in hospital room?: A Little Help needed climbing 3-5 steps with a railing? : A Lot 6 Click Score: 17    End of Session Equipment Utilized During Treatment: Gait belt Activity Tolerance: Patient tolerated treatment well Patient left: in chair;with call bell/phone within reach;with chair alarm set Nurse Communication: Mobility status PT Visit Diagnosis: Other abnormalities of gait and mobility (R26.89);Muscle weakness (generalized) (M62.81)     Time: 7824-2353 PT Time Calculation (min) (ACUTE ONLY): 25 min  Charges:  $Gait Training: 8-22 mins $Therapeutic Activity: 8-22 mins                     Rica Koyanagi  PTA Acute  Rehabilitation Services Pager      [phone] Office      [phone]

## 2018-07-08 NOTE — Discharge Instructions (Signed)
Acute Kidney Injury, Adult  Acute kidney injury is a sudden worsening of kidney function. The kidneys are organs that have several jobs. They filter the blood to remove waste products and extra fluid. They also maintain a healthy balance of minerals and hormones in the body, which helps control blood pressure and keep bones strong. With this condition, your kidneys do not do their jobs as well as they should. This condition ranges from mild to severe. Over time it may develop into long-lasting (chronic) kidney disease. Early detection and treatment may prevent acute kidney injury from developing into a chronic condition. What are the causes? Common causes of this condition include:  A problem with blood flow to the kidneys. This may be caused by: ? Low blood pressure (hypotension) or shock. ? Blood loss. ? Heart and blood vessel (cardiovascular) disease. ? Severe burns. ? Liver disease.  Direct damage to the kidneys. This may be caused by: ? Certain medicines. ? A kidney infection. ? Poisoning. ? Being around or in contact with toxic substances. ? A surgical wound. ? A hard, direct hit to the kidney area.  A sudden blockage of urine flow. This may be caused by: ? Cancer. ? Kidney stones. ? An enlarged prostate in males. What are the signs or symptoms? Symptoms of this condition may not be obvious until the condition becomes severe. Symptoms of this condition can include:  Tiredness (lethargy), or difficulty staying awake.  Nausea or vomiting.  Swelling (edema) of the face, legs, ankles, or feet.  Problems with urination, such as: ? Abdominal pain, or pain along the side of your stomach (flank). ? Decreased urine production. ? Decrease in the force of urine flow.  Muscle twitches and cramps, especially in the legs.  Confusion or trouble concentrating.  Loss of appetite.  Fever. How is this diagnosed? This condition may be diagnosed with tests, including:  Blood  tests.  Urine tests.  Imaging tests.  A test in which a sample of tissue is removed from the kidneys to be examined under a microscope (kidney biopsy). How is this treated? Treatment for this condition depends on the cause and how severe the condition is. In mild cases, treatment may not be needed. The kidneys may heal on their own. In more severe cases, treatment will involve:  Treating the cause of the kidney injury. This may involve changing any medicines you are taking or adjusting your dosage.  Fluids. You may need specialized IV fluids to balance your body's needs.  Having a catheter placed to drain urine and prevent blockages.  Preventing problems from occurring. This may mean avoiding certain medicines or procedures that can cause further injury to the kidneys. In some cases treatment may also require:  A procedure to remove toxic wastes from the body (dialysis or continuous renal replacement therapy - CRRT).  Surgery. This may be done to repair a torn kidney, or to remove the blockage from the urinary system. Follow these instructions at home: Medicines  Take over-the-counter and prescription medicines only as told by your health care provider.  Do not take any new medicines without your health care provider's approval. Many medicines can worsen your kidney damage.  Do not take any vitamin and mineral supplements without your health care provider's approval. Many nutritional supplements can worsen your kidney damage. Lifestyle  If your health care provider prescribed changes to your diet, follow them. You may need to decrease the amount of protein you eat.  Achieve and maintain a  healthy weight. If you need help with this, ask your health care provider.  Start or continue an exercise plan. Try to exercise at least 30 minutes a day, 5 days a week.  Do not use any tobacco products, such as cigarettes, chewing tobacco, and e-cigarettes. If you need help quitting, ask your  health care provider. General instructions  Keep track of your blood pressure. Report changes in your blood pressure as told by your health care provider.  Stay up to date with immunizations. Ask your health care provider which immunizations you need.  Keep all follow-up visits as told by your health care provider. This is important. Where to find more information  American Association of Kidney Patients: BombTimer.gl  National Kidney Foundation: www.kidney.[place]: https://mathis.com/  Life Options [facility]: ? www.lifeoptions.org ? www.kidneyschool.org Contact a health care provider if:  Your symptoms get worse.  You develop new symptoms. Get help right away if:  You develop symptoms of worsening kidney disease, which include: ? Headaches. ? Abnormally dark or light skin. ? Easy bruising. ? Frequent hiccups. ? Chest pain. ? Shortness of breath. ? End of menstruation in women. ? Seizures. ? Confusion or altered mental status. ? Abdominal or back pain. ? Itchiness.  You have a fever.  Your body is producing less urine.  You have pain or bleeding when you urinate. Summary  Acute kidney injury is a sudden worsening of kidney function.  Acute kidney injury can be caused by problems with blood flow to the kidneys, direct damage to the kidneys, and sudden blockage of urine flow.  Symptoms of this condition may not be obvious until it becomes severe. Symptoms may include edema, lethargy, confusion, nausea or vomiting, and problems passing urine.  This condition can usually be diagnosed with blood tests, urine tests, and imaging tests. Sometimes a kidney biopsy is done to diagnose this condition.  Treatment for this condition often involves treating the underlying cause. It is treated with fluids, medicines, dialysis, diet changes, or surgery. This information is not intended to replace advice given to you by your health care provider. Make  sure you discuss any questions you have with your health care provider. Document Released: 09/19/2010 Document Revised: 07/06/2016 Document Reviewed: 02/25/2016 Elsevier Interactive Patient Education  2019 Elsevier Inc. Urinary Tract Infection, Adult A urinary tract infection (UTI) is an infection of any part of the urinary tract. The urinary tract includes:  The kidneys.  The ureters.  The bladder.  The urethra. These organs make, store, and get rid of pee (urine) in the body. What are the causes? This is caused by germs (bacteria) in your genital area. These germs grow and cause swelling (inflammation) of your urinary tract. What increases the risk? You are more likely to develop this condition if:  You have a small, thin tube (catheter) to drain pee.  You cannot control when you pee or poop (incontinence).  You are female, and: ? You use these methods to prevent pregnancy: ? A medicine that kills sperm (spermicide). ? A device that blocks sperm (diaphragm). ? You have low levels of a female hormone (estrogen). ? You are pregnant.  You have genes that add to your risk.  You are sexually active.  You take antibiotic medicines.  You have trouble peeing because of: ? A prostate that is bigger than normal, if you are female. ? A blockage in the part of your body that drains pee from the bladder (urethra). ? A kidney stone. ?  A nerve condition that affects your bladder (neurogenic bladder). ? Not getting enough to drink. ? Not peeing often enough.  You have other conditions, such as: ? Diabetes. ? A weak disease-fighting system (immune system). ? Sickle cell disease. ? Gout. ? Injury of the spine. What are the signs or symptoms? Symptoms of this condition include:  Needing to pee right away (urgently).  Peeing often.  Peeing small amounts often.  Pain or burning when peeing.  Blood in the pee.  Pee that smells bad or not like normal.  Trouble  peeing.  Pee that is cloudy.  Fluid coming from the vagina, if you are female.  Pain in the belly or lower back. Other symptoms include:  Throwing up (vomiting).  No urge to eat.  Feeling mixed up (confused).  Being tired and grouchy (irritable).  A fever.  Watery poop (diarrhea). How is this treated? This condition may be treated with:  Antibiotic medicine.  Other medicines.  Drinking enough water. Follow these instructions at home:  Medicines  Take over-the-counter and prescription medicines only as told by your doctor.  If you were prescribed an antibiotic medicine, take it as told by your doctor. Do not stop taking it even if you start to feel better. General instructions  Make sure you: ? Pee until your bladder is empty. ? Do not hold pee for a long time. ? Empty your bladder after sex. ? Wipe from front to back after pooping if you are a female. Use each tissue one time when you wipe.  Drink enough fluid to keep your pee pale yellow.  Keep all follow-up visits as told by your doctor. This is important. Contact a doctor if:  You do not get better after 1-2 days.  Your symptoms go away and then come back. Get help right away if:  You have very bad back pain.  You have very bad pain in your lower belly.  You have a fever.  You are sick to your stomach (nauseous).  You are throwing up. Summary  A urinary tract infection (UTI) is an infection of any part of the urinary tract.  This condition is caused by germs in your genital area.  There are many risk factors for a UTI. These include having a small, thin tube to drain pee and not being able to control when you pee or poop.  Treatment includes antibiotic medicines for germs.  Drink enough fluid to keep your pee pale yellow. This information is not intended to replace advice given to you by your health care provider. Make sure you discuss any questions you have with your health care  provider. Document Released: 08/23/2007 Document Revised: 09/13/2017 Document Reviewed: 09/13/2017 Elsevier Interactive Patient Education  2019 [place].  Cephalexin tablets or capsules What is this medicine? CEPHALEXIN (sef a LEX in) is a cephalosporin antibiotic. It is used to treat certain kinds of bacterial infections It will not work for colds, flu, or other viral infections. This medicine may be used for other purposes; ask your health care provider or pharmacist if you have questions. COMMON BRAND NAME(S): Biocef, Daxbia, Keflex, Keftab What should I tell my health care provider before I take this medicine? They need to know if you have any of these conditions: -kidney disease -stomach or intestine problems, especially colitis -an unusual or allergic reaction to cephalexin, other cephalosporins, penicillins, other antibiotics, medicines, foods, dyes or preservatives -pregnant or trying to get pregnant -breast-feeding How should I use this medicine? Take  this medicine by mouth with a full glass of water. Follow the directions on the prescription label. This medicine can be taken with or without food. Take your medicine at regular intervals. Do not take your medicine more often than directed. Take all of your medicine as directed even if you think you are better. Do not skip doses or stop your medicine early. Talk to your pediatrician regarding the use of this medicine in children. While this drug may be prescribed for selected conditions, precautions do apply. Overdosage: If you think you have taken too much of this medicine contact a poison control center or emergency room at once. NOTE: This medicine is only for you. Do not share this medicine with others. What if I miss a dose? If you miss a dose, take it as soon as you can. If it is almost time for your next dose, take only that dose. Do not take double or extra doses. There should be at least 4 to 6 hours between doses. What  may interact with this medicine? -probenecid -some other antibiotics This list may not describe all possible interactions. Give your health care provider a list of all the medicines, herbs, non-prescription drugs, or dietary supplements you use. Also tell them if you smoke, drink alcohol, or use illegal drugs. Some items may interact with your medicine. What should I watch for while using this medicine? Tell your doctor or health care professional if your symptoms do not begin to improve in a few days. Do not treat diarrhea with over the counter products. Contact your doctor if you have diarrhea that lasts more than 2 days or if it is severe and watery. If you have diabetes, you may get a false-positive result for sugar in your urine. Check with your doctor or health care professional. What side effects may I notice from receiving this medicine? Side effects that you should report to your doctor or health care professional as soon as possible: -allergic reactions like skin rash, itching or hives, swelling of the face, lips, or tongue -breathing problems -pain or trouble passing urine -redness, blistering, peeling or loosening of the skin, including inside the mouth -severe or watery diarrhea -unusually weak or tired -yellowing of the eyes, skin Side effects that usually do not require medical attention (report to your doctor or health care professional if they continue or are bothersome): -gas or heartburn -genital or anal irritation -headache -joint or muscle pain -nausea, vomiting This list may not describe all possible side effects. Call your doctor for medical advice about side effects. You may report side effects to FDA at 1-800-FDA-1088. Where should I keep my medicine? Keep out of the reach of children. Store at room temperature between 59 and 86 degrees F (15 and 30 degrees C). Throw away any unused medicine after the expiration date. NOTE: This sheet is a summary. It may not cover  all possible information. If you have questions about this medicine, talk to your doctor, pharmacist, or health care provider.  2019 Elsevier/Gold Standard (2007-06-10 17:09:13)

## 2018-07-08 NOTE — Progress Notes (Signed)
Occupational Therapy Treatment Patient Details Name: Shannon Blair MRN: [id number] DOB: 12-10-1931 Today's Date: 07/08/2018    History of present illness 85 y.o. female with medical history significant of dementia, hypertension, anal cancer 2004, skin squamous cell cancer, varicose veins in leg, and admitted for acute metabolic encephalopathy and Acute cystitis without hematuria   OT comments  Pt much improved this day. Plan is DC home with 24/7 A  Follow Up Recommendations  Supervision/Assistance - 24 hour;Home health OT    Equipment Recommendations  None recommended by OT    Recommendations for Other Services      Precautions / Restrictions Precautions Precautions: Fall Precaution Comments:    Restrictions Weight Bearing Restrictions: No       Mobility Bed Mobility Overal bed mobility: Needs Assistance Bed Mobility: Supine to Sit     Supine to sit: Supervision        Transfers Overall transfer level: Needs assistance Equipment used: Rolling walker (2 wheeled) Transfers: Sit to/from [facility] Sit to Stand: Min guard Stand pivot transfers: Min guard       General transfer comment: 1 person hand held A    Balance Overall balance assessment: Needs assistance Sitting-balance support: Feet supported Sitting balance-Leahy Scale: Good     Standing balance support: Bilateral upper extremity supported Standing balance-Leahy Scale: Fair                             ADL either performed or assessed with clinical judgement   ADL Overall ADL's : Needs assistance/impaired Eating/Feeding: Set up;Sitting   Grooming: Sitting;Minimal assistance   Upper Body Bathing: Minimal assistance;Sitting   Lower Body Bathing: Sit to/from stand;Cueing for sequencing;Cueing for safety;Minimal assistance   Upper Body Dressing : Sitting;Set up   Lower Body Dressing: Sit to/from stand;Cueing for sequencing;Cueing for safety;Minimal assistance    Toilet Transfer: Comfort height toilet;Cueing for safety;Cueing for sequencing;Stand-pivot;Min guard Toilet Transfer Details (indicate cue type and reason): one person hand held A Toileting- Clothing Manipulation and Hygiene: Sit to/from stand;Cueing for sequencing;Cueing for safety;Min guard       Functional mobility during ADLs: Cueing for safety;Cueing for sequencing;Minimal assistance General ADL Comments: Per RN pt has 24/7 A at home     Vision Patient Visual Report: No change from baseline            Cognition Arousal/Alertness: Awake/alert Behavior During Therapy: WFL for tasks assessed/performed Overall Cognitive Status: No family/caregiver present to determine baseline cognitive functioning                                 General Comments: mild dementia per chart, pt with slow processing and requiring safety cues                   Pertinent Vitals/ Pain       Pain Assessment: No/denies pain     Prior Functioning/Environment              Frequency  Min 2X/week        Progress Toward Goals  OT Goals(current goals can now be found in the care plan section)  Progress towards OT goals: Progressing toward goals     Plan Discharge plan needs to be updated       AM-PAC OT "6 Clicks" Daily Activity     Outcome Measure   Help from another person eating meals?:  A Little Help from another person taking care of personal grooming?: A Little Help from another person toileting, which includes using toliet, bedpan, or urinal?: A Little Help from another person bathing (including washing, rinsing, drying)?: A Little Help from another person to put on and taking off regular upper body clothing?: A Little Help from another person to put on and taking off regular lower body clothing?: A Little 6 Click Score: 18    End of Session Equipment Utilized During Treatment: Gait belt  OT Visit Diagnosis: Unsteadiness on feet (R26.81);Other  abnormalities of gait and mobility (R26.89);Repeated falls (R29.6);Muscle weakness (generalized) (M62.81);History of falling (Z91.81)   Activity Tolerance Patient tolerated treatment well   Patient Left in chair;with call bell/phone within reach;with chair alarm set   Nurse Communication Mobility status        Time: 1100-1117 OT Time Calculation (min): 17 min  Charges: OT General Charges $OT Visit: 1 Visit OT Treatments $Self Care/Home Management : 8-22 mins  Kari Baars, [place] [phone] Office- [phone]      Tesia Lybrand, Edwena Felty D 07/08/2018, 11:32 AM

## 2018-07-08 NOTE — TOC Transition Note (Signed)
Transition of Care [facility]) - CM/SW Discharge Note   Patient Details  Name: CORRENE LALANI MRN: [id number] Date of Birth: July 01, 1931  [facility]) CM/SW Contact:  Leeroy Cha, RN Phone Number: 07/08/2018, 10:37 AM   Clinical Narrative:    Discharged to home with 24 hour care through the poa.  [place] is doing the hhc and is aware of discharge.   Final next level of care: [place] Barriers to Discharge: No Barriers Identified   Patient Goals and CMS Choice Patient states their goals for this hospitalization and ongoing recovery are:: patient confused, unable to participate in goal setting; POA goal is to get patient stabilized and back home CMS Medicare.gov Compare Post Acute Care list provided to:: Patient Represenative (must comment) Choice offered to / list presented to : [place] / [place]  Discharge Placement                       Discharge Plan and Services     Post Acute Care Choice: Home Health              HH Arranged: PT, OT, Nurse's Aide [place] Agency: [facility]   Social Determinants of Health (SDOH) Interventions     Readmission Risk Interventions No flowsheet data found.

## 2018-07-08 NOTE — TOC Transition Note (Signed)
Transition of Care [facility]) - CM/SW Discharge Note   Patient Details  Name: LEIDI ASTLE MRN: [id number] Date of Birth: 1931-06-29  [facility]) CM/SW Contact:  Leeroy Cha, RN Phone Number: 07/08/2018, 9:22 AM   Clinical Narrative:   [facility]     Barriers to Discharge: Requiring sitter/restraints, Continued Medical Work up   Patient Goals and CMS Choice Patient states their goals for this hospitalization and ongoing recovery are:: patient confused, unable to participate in goal setting; POA goal is to get patient stabilized and back home CMS Medicare.gov Compare Post Acute Care list provided to:: Patient Represenative (must comment) Choice offered to / list presented to : [place] / [place]  Discharge Placement                       Discharge Plan and Services     Post Acute Care Choice: Home Health              HH Arranged: PT, OT, Nurse's Aide [place] Agency: [facility]   Social Determinants of Health (SDOH) Interventions     Readmission Risk Interventions No flowsheet data found.

## 2018-07-11 LAB — CULTURE, BLOOD (ROUTINE X 2)
Culture: NO GROWTH
Culture: NO GROWTH
Special Requests: ADEQUATE
Special Requests: ADEQUATE

## 2018-07-11 LAB — PTH-RELATED PEPTIDE: PTH-related peptide: <2 pmol/L

## 2018-07-16 DIAGNOSIS — I10 Essential (primary) hypertension: Secondary | ICD-10-CM | POA: Diagnosis not present

## 2018-07-16 DIAGNOSIS — N390 Urinary tract infection, site not specified: Secondary | ICD-10-CM | POA: Diagnosis not present

## 2018-07-19 DIAGNOSIS — Z7189 Other specified counseling: Secondary | ICD-10-CM | POA: Diagnosis not present

## 2018-07-19 DIAGNOSIS — N390 Urinary tract infection, site not specified: Secondary | ICD-10-CM | POA: Diagnosis not present

## 2018-07-19 DIAGNOSIS — E782 Mixed hyperlipidemia: Secondary | ICD-10-CM | POA: Diagnosis not present

## 2018-07-19 DIAGNOSIS — N179 Acute kidney failure, unspecified: Secondary | ICD-10-CM | POA: Diagnosis not present

## 2018-07-19 DIAGNOSIS — G9341 Metabolic encephalopathy: Secondary | ICD-10-CM | POA: Diagnosis not present

## 2018-08-05 ENCOUNTER — Inpatient Hospital Stay (HOSPITAL_COMMUNITY)
Admission: EM | Admit: 2018-08-05 | Discharge: 2018-08-09 | DRG: 690 | Disposition: A | Discharge status: Skilled Nursing Facility | Payer: Medicare HMO | Attending: Internal Medicine | Admitting: Internal Medicine

## 2018-08-05 DIAGNOSIS — N132 Hydronephrosis with renal and ureteral calculous obstruction: Secondary | ICD-10-CM | POA: Diagnosis not present

## 2018-08-05 DIAGNOSIS — M1990 Unspecified osteoarthritis, unspecified site: Secondary | ICD-10-CM | POA: Diagnosis present

## 2018-08-05 DIAGNOSIS — Z800 Family history of malignant neoplasm of digestive organs: Secondary | ICD-10-CM

## 2018-08-05 DIAGNOSIS — I700 Atherosclerosis of aorta: Secondary | ICD-10-CM | POA: Diagnosis present

## 2018-08-05 DIAGNOSIS — I129 Hypertensive chronic kidney disease with stage 1 through stage 4 chronic kidney disease, or unspecified chronic kidney disease: Secondary | ICD-10-CM | POA: Diagnosis present

## 2018-08-05 DIAGNOSIS — Z8249 Family history of ischemic heart disease and other diseases of the circulatory system: Secondary | ICD-10-CM

## 2018-08-05 DIAGNOSIS — N3000 Acute cystitis without hematuria: Secondary | ICD-10-CM | POA: Diagnosis not present

## 2018-08-05 DIAGNOSIS — R531 Weakness: Secondary | ICD-10-CM | POA: Diagnosis not present

## 2018-08-05 DIAGNOSIS — K8020 Calculus of gallbladder without cholecystitis without obstruction: Secondary | ICD-10-CM | POA: Diagnosis present

## 2018-08-05 DIAGNOSIS — F0390 Unspecified dementia without behavioral disturbance: Secondary | ICD-10-CM | POA: Diagnosis present

## 2018-08-05 DIAGNOSIS — G9349 Other encephalopathy: Secondary | ICD-10-CM | POA: Diagnosis not present

## 2018-08-05 DIAGNOSIS — Z87440 Personal history of urinary (tract) infections: Secondary | ICD-10-CM | POA: Diagnosis not present

## 2018-08-05 DIAGNOSIS — I10 Essential (primary) hypertension: Secondary | ICD-10-CM | POA: Diagnosis not present

## 2018-08-05 DIAGNOSIS — Z20828 Contact with and (suspected) exposure to other viral communicable diseases: Secondary | ICD-10-CM | POA: Diagnosis not present

## 2018-08-05 DIAGNOSIS — R7989 Other specified abnormal findings of blood chemistry: Secondary | ICD-10-CM

## 2018-08-05 DIAGNOSIS — R410 Disorientation, unspecified: Secondary | ICD-10-CM | POA: Diagnosis not present

## 2018-08-05 DIAGNOSIS — E860 Dehydration: Secondary | ICD-10-CM | POA: Diagnosis present

## 2018-08-05 DIAGNOSIS — R29898 Other symptoms and signs involving the musculoskeletal system: Secondary | ICD-10-CM | POA: Diagnosis not present

## 2018-08-05 DIAGNOSIS — G9340 Encephalopathy, unspecified: Secondary | ICD-10-CM | POA: Diagnosis present

## 2018-08-05 DIAGNOSIS — I959 Hypotension, unspecified: Secondary | ICD-10-CM | POA: Diagnosis not present

## 2018-08-05 DIAGNOSIS — N179 Acute kidney failure, unspecified: Secondary | ICD-10-CM | POA: Diagnosis present

## 2018-08-05 DIAGNOSIS — N136 Pyonephrosis: Principal | ICD-10-CM | POA: Diagnosis present

## 2018-08-05 DIAGNOSIS — R2689 Other abnormalities of gait and mobility: Secondary | ICD-10-CM | POA: Diagnosis not present

## 2018-08-05 DIAGNOSIS — R945 Abnormal results of liver function studies: Secondary | ICD-10-CM | POA: Diagnosis present

## 2018-08-05 DIAGNOSIS — N12 Tubulo-interstitial nephritis, not specified as acute or chronic: Secondary | ICD-10-CM

## 2018-08-05 DIAGNOSIS — D631 Anemia in chronic kidney disease: Secondary | ICD-10-CM | POA: Diagnosis present

## 2018-08-05 DIAGNOSIS — Z905 Acquired absence of kidney: Secondary | ICD-10-CM

## 2018-08-05 DIAGNOSIS — W19XXXA Unspecified fall, initial encounter: Secondary | ICD-10-CM | POA: Diagnosis not present

## 2018-08-05 DIAGNOSIS — Z923 Personal history of irradiation: Secondary | ICD-10-CM

## 2018-08-05 DIAGNOSIS — Z90710 Acquired absence of both cervix and uterus: Secondary | ICD-10-CM | POA: Diagnosis not present

## 2018-08-05 DIAGNOSIS — Z66 Do not resuscitate: Secondary | ICD-10-CM | POA: Diagnosis not present

## 2018-08-05 DIAGNOSIS — R279 Unspecified lack of coordination: Secondary | ICD-10-CM | POA: Diagnosis not present

## 2018-08-05 DIAGNOSIS — R338 Other retention of urine: Secondary | ICD-10-CM | POA: Diagnosis not present

## 2018-08-05 DIAGNOSIS — I159 Secondary hypertension, unspecified: Secondary | ICD-10-CM | POA: Diagnosis not present

## 2018-08-05 DIAGNOSIS — E872 Acidosis: Secondary | ICD-10-CM | POA: Diagnosis present

## 2018-08-05 DIAGNOSIS — R52 Pain, unspecified: Secondary | ICD-10-CM

## 2018-08-05 DIAGNOSIS — Z7982 Long term (current) use of aspirin: Secondary | ICD-10-CM | POA: Diagnosis not present

## 2018-08-05 DIAGNOSIS — Z743 Need for continuous supervision: Secondary | ICD-10-CM | POA: Diagnosis not present

## 2018-08-05 DIAGNOSIS — N390 Urinary tract infection, site not specified: Secondary | ICD-10-CM | POA: Diagnosis not present

## 2018-08-05 DIAGNOSIS — N183 Chronic kidney disease, stage 3 (moderate): Secondary | ICD-10-CM | POA: Diagnosis present

## 2018-08-05 DIAGNOSIS — R278 Other lack of coordination: Secondary | ICD-10-CM | POA: Diagnosis not present

## 2018-08-05 DIAGNOSIS — R4020 Unspecified coma: Secondary | ICD-10-CM | POA: Diagnosis not present

## 2018-08-05 DIAGNOSIS — N1330 Unspecified hydronephrosis: Secondary | ICD-10-CM | POA: Diagnosis present

## 2018-08-05 DIAGNOSIS — R2681 Unsteadiness on feet: Secondary | ICD-10-CM | POA: Diagnosis not present

## 2018-08-05 DIAGNOSIS — Z85048 Personal history of other malignant neoplasm of rectum, rectosigmoid junction, and anus: Secondary | ICD-10-CM

## 2018-08-05 DIAGNOSIS — M5136 Other intervertebral disc degeneration, lumbar region: Secondary | ICD-10-CM | POA: Diagnosis present

## 2018-08-05 DIAGNOSIS — Z1159 Encounter for screening for other viral diseases: Secondary | ICD-10-CM

## 2018-08-05 DIAGNOSIS — Z79899 Other long term (current) drug therapy: Secondary | ICD-10-CM

## 2018-08-05 DIAGNOSIS — R402411 Glasgow coma scale score 13-15, in the field [EMT or ambulance]: Secondary | ICD-10-CM | POA: Diagnosis not present

## 2018-08-05 DIAGNOSIS — R41841 Cognitive communication deficit: Secondary | ICD-10-CM | POA: Diagnosis not present

## 2018-08-05 NOTE — ED Notes (Signed)
Bed: WA19 Expected date:  Expected time:  Means of arrival:  Comments: EMS 

## 2018-08-05 NOTE — ED Triage Notes (Signed)
Patient is from home and transported via [facility]. According to [facility], patient was diagnosed with a UTI on last Monday. Patient continues to experiencing weakness.

## 2018-08-06 ENCOUNTER — Observation Stay (HOSPITAL_COMMUNITY): Payer: Medicare HMO

## 2018-08-06 ENCOUNTER — Emergency Department (HOSPITAL_COMMUNITY): Payer: Medicare HMO

## 2018-08-06 ENCOUNTER — Encounter (HOSPITAL_COMMUNITY): Payer: Self-pay | Admitting: Family Medicine

## 2018-08-06 ENCOUNTER — Other Ambulatory Visit: Payer: Self-pay

## 2018-08-06 DIAGNOSIS — G9340 Encephalopathy, unspecified: Secondary | ICD-10-CM | POA: Diagnosis present

## 2018-08-06 DIAGNOSIS — N1330 Unspecified hydronephrosis: Secondary | ICD-10-CM | POA: Diagnosis present

## 2018-08-06 DIAGNOSIS — I10 Essential (primary) hypertension: Secondary | ICD-10-CM

## 2018-08-06 DIAGNOSIS — N3000 Acute cystitis without hematuria: Secondary | ICD-10-CM | POA: Diagnosis not present

## 2018-08-06 DIAGNOSIS — N179 Acute kidney failure, unspecified: Secondary | ICD-10-CM | POA: Diagnosis not present

## 2018-08-06 DIAGNOSIS — N12 Tubulo-interstitial nephritis, not specified as acute or chronic: Secondary | ICD-10-CM

## 2018-08-06 DIAGNOSIS — R4020 Unspecified coma: Secondary | ICD-10-CM | POA: Diagnosis not present

## 2018-08-06 DIAGNOSIS — N132 Hydronephrosis with renal and ureteral calculous obstruction: Secondary | ICD-10-CM | POA: Diagnosis not present

## 2018-08-06 LAB — COMPREHENSIVE METABOLIC PANEL
ALT: 81 U/L — ABNORMAL HIGH (ref 0–44)
ALT: 67 U/L — ABNORMAL HIGH (ref 0–44)
AST: 51 U/L — ABNORMAL HIGH (ref 15–41)
AST: 44 U/L — ABNORMAL HIGH (ref 15–41)
Albumin: 3 g/dL — ABNORMAL LOW (ref 3.5–5.0)
Albumin: 2.6 g/dL — ABNORMAL LOW (ref 3.5–5.0)
Alkaline Phosphatase: 253 U/L — ABNORMAL HIGH (ref 38–126)
Alkaline Phosphatase: 225 U/L — ABNORMAL HIGH (ref 38–126)
Anion gap: 11 (ref 5–15)
Anion gap: 6 (ref 5–15)
BUN: 46 mg/dL — ABNORMAL HIGH (ref 8–23)
BUN: 42 mg/dL — ABNORMAL HIGH (ref 8–23)
CO2: 20 mmol/L — ABNORMAL LOW (ref 22–32)
CO2: 20 mmol/L — ABNORMAL LOW (ref 22–32)
Calcium: 9.4 mg/dL (ref 8.9–10.3)
Calcium: 8.6 mg/dL — ABNORMAL LOW (ref 8.9–10.3)
Chloride: 104 mmol/L (ref 98–111)
Chloride: 110 mmol/L (ref 98–111)
Creatinine, Ser: 2.07 mg/dL — ABNORMAL HIGH (ref 0.44–1.00)
Creatinine, Ser: 1.81 mg/dL — ABNORMAL HIGH (ref 0.44–1.00)
GFR calc Af Amer: 25 mL/min — ABNORMAL LOW (ref >60)
GFR calc Af Amer: 29 mL/min — ABNORMAL LOW (ref >60)
GFR calc non Af Amer: 21 mL/min — ABNORMAL LOW (ref >60)
GFR calc non Af Amer: 25 mL/min — ABNORMAL LOW (ref >60)
Glucose, Bld: 113 mg/dL — ABNORMAL HIGH (ref 70–99)
Glucose, Bld: 98 mg/dL (ref 70–99)
Potassium: 4.2 mmol/L (ref 3.5–5.1)
Potassium: 4.1 mmol/L (ref 3.5–5.1)
Sodium: 135 mmol/L (ref 135–145)
Sodium: 136 mmol/L (ref 135–145)
Total Bilirubin: 0.4 mg/dL (ref 0.3–1.2)
Total Bilirubin: 0.2 mg/dL — ABNORMAL LOW (ref 0.3–1.2)
Total Protein: 6.5 g/dL (ref 6.5–8.1)
Total Protein: 5.7 g/dL — ABNORMAL LOW (ref 6.5–8.1)

## 2018-08-06 LAB — CBC WITH DIFFERENTIAL/PLATELET
Abs Immature Granulocytes: 0.61 10*3/uL — ABNORMAL HIGH (ref 0.00–0.07)
Abs Immature Granulocytes: 0.46 10*3/uL — ABNORMAL HIGH (ref 0.00–0.07)
Basophils Absolute: 0.1 10*3/uL (ref 0.0–0.1)
Basophils Absolute: 0.1 10*3/uL (ref 0.0–0.1)
Basophils Relative: 1 %
Basophils Relative: 1 %
Eosinophils Absolute: 0.2 10*3/uL (ref 0.0–0.5)
Eosinophils Absolute: 0.2 10*3/uL (ref 0.0–0.5)
Eosinophils Relative: 2 %
Eosinophils Relative: 2 %
HCT: 32.8 % — ABNORMAL LOW (ref 36.0–46.0)
HCT: 27.8 % — ABNORMAL LOW (ref 36.0–46.0)
Hemoglobin: 10.6 g/dL — ABNORMAL LOW (ref 12.0–15.0)
Hemoglobin: 8.9 g/dL — ABNORMAL LOW (ref 12.0–15.0)
Immature Granulocytes: 5 %
Immature Granulocytes: 4 %
Lymphocytes Relative: 11 %
Lymphocytes Relative: 9 %
Lymphs Abs: 1.4 10*3/uL (ref 0.7–4.0)
Lymphs Abs: 0.9 10*3/uL (ref 0.7–4.0)
MCH: 29.1 pg (ref 26.0–34.0)
MCH: 29 pg (ref 26.0–34.0)
MCHC: 32.3 g/dL (ref 30.0–36.0)
MCHC: 32 g/dL (ref 30.0–36.0)
MCV: 90.1 fL (ref 80.0–100.0)
MCV: 90.6 fL (ref 80.0–100.0)
Monocytes Absolute: 0.9 10*3/uL (ref 0.1–1.0)
Monocytes Absolute: 0.8 10*3/uL (ref 0.1–1.0)
Monocytes Relative: 7 %
Monocytes Relative: 8 %
Neutro Abs: 10 10*3/uL — ABNORMAL HIGH (ref 1.7–7.7)
Neutro Abs: 8 10*3/uL — ABNORMAL HIGH (ref 1.7–7.7)
Neutrophils Relative %: 74 %
Neutrophils Relative %: 76 %
Platelets: 214 10*3/uL (ref 150–400)
Platelets: 199 10*3/uL (ref 150–400)
RBC: 3.64 MIL/uL — ABNORMAL LOW (ref 3.87–5.11)
RBC: 3.07 MIL/uL — ABNORMAL LOW (ref 3.87–5.11)
RDW: 13 % (ref 11.5–15.5)
RDW: 13 % (ref 11.5–15.5)
WBC: 13.3 10*3/uL — ABNORMAL HIGH (ref 4.0–10.5)
WBC: 10.4 10*3/uL (ref 4.0–10.5)
nRBC: 0 % (ref 0.0–0.2)
nRBC: 0 % (ref 0.0–0.2)

## 2018-08-06 LAB — ACETAMINOPHEN LEVEL: Acetaminophen (Tylenol), Serum: <10 ug/mL — ABNORMAL LOW (ref 10–30)

## 2018-08-06 LAB — URINALYSIS, ROUTINE W REFLEX MICROSCOPIC
Bilirubin Urine: NEGATIVE
Glucose, UA: NEGATIVE mg/dL
Ketones, ur: NEGATIVE mg/dL
Nitrite: POSITIVE — AB
Protein, ur: 30 mg/dL — AB
Specific Gravity, Urine: 1.012 (ref 1.005–1.030)
WBC, UA: >50 WBC/hpf — ABNORMAL HIGH (ref 0–5)
pH: 5 (ref 5.0–8.0)

## 2018-08-06 LAB — FERRITIN: Ferritin: 180 ng/mL (ref 11–307)

## 2018-08-06 LAB — RETICULOCYTES
Immature Retic Fract: 19.5 % — ABNORMAL HIGH (ref 2.3–15.9)
RBC.: 3.07 MIL/uL — ABNORMAL LOW (ref 3.87–5.11)
Retic Count, Absolute: 27 10*3/uL (ref 19.0–186.0)
Retic Ct Pct: 0.9 % (ref 0.4–3.1)

## 2018-08-06 LAB — TROPONIN I: Troponin I: 0.03 ng/mL (ref <0.03)

## 2018-08-06 LAB — IRON AND TIBC
Iron: 12 ug/dL — ABNORMAL LOW (ref 28–170)
Saturation Ratios: 8 % — ABNORMAL LOW (ref 10.4–31.8)
TIBC: 151 ug/dL — ABNORMAL LOW (ref 250–450)
UIBC: 139 ug/dL

## 2018-08-06 LAB — FOLATE: Folate: 24.1 ng/mL (ref >5.9)

## 2018-08-06 LAB — AMMONIA: Ammonia: <9 umol/L — ABNORMAL LOW (ref 9–35)

## 2018-08-06 LAB — VITAMIN B12: Vitamin B-12: 2259 pg/mL — ABNORMAL HIGH (ref 180–914)

## 2018-08-06 LAB — TSH: TSH: 1.031 u[IU]/mL (ref 0.350–4.500)

## 2018-08-06 LAB — SARS CORONAVIRUS 2 BY RT PCR (HOSPITAL ORDER, PERFORMED IN ~~LOC~~ HOSPITAL LAB): SARS Coronavirus 2: NEGATIVE

## 2018-08-06 MED ORDER — SODIUM CHLORIDE 0.9 % IV SOLN
1.0000 g | INTRAVENOUS | Status: DC
  Administered 2018-08-06 – 2018-08-08 (×3): 1 g via INTRAVENOUS
  Filled 2018-08-06 (×2): qty 1
  Filled 2018-08-06 (×2): qty 10
  Filled 2018-08-06: qty 1

## 2018-08-06 MED ORDER — SODIUM CHLORIDE 0.9 % IV BOLUS
1000.0000 mL | Freq: Once | INTRAVENOUS | Status: AC
  Administered 2018-08-06: 1000 mL via INTRAVENOUS

## 2018-08-06 MED ORDER — HYDRALAZINE HCL 20 MG/ML IJ SOLN: 10.0000 mg | INTRAMUSCULAR | Status: DC | PRN

## 2018-08-06 MED ORDER — VITAMIN C 500 MG PO TABS
500.0000 mg | ORAL_TABLET | Freq: Every day | ORAL | Status: DC
  Administered 2018-08-06 – 2018-08-09 (×4): 500 mg via ORAL
  Filled 2018-08-06 (×4): qty 1

## 2018-08-06 MED ORDER — ONDANSETRON HCL 4 MG/2ML IJ SOLN: 4.0000 mg | Freq: Four times a day (QID) | INTRAMUSCULAR | Status: DC | PRN

## 2018-08-06 MED ORDER — ENSURE ENLIVE PO LIQD
237.0000 mL | Freq: Two times a day (BID) | ORAL | Status: DC
  Administered 2018-08-06 – 2018-08-09 (×5): 237 mL via ORAL

## 2018-08-06 MED ORDER — OXYBUTYNIN CHLORIDE 5 MG PO TABS
5.0000 mg | ORAL_TABLET | Freq: Two times a day (BID) | ORAL | Status: DC
  Administered 2018-08-06 – 2018-08-09 (×7): 5 mg via ORAL
  Filled 2018-08-06 (×7): qty 1

## 2018-08-06 MED ORDER — ONDANSETRON HCL 4 MG PO TABS: 4.0000 mg | ORAL_TABLET | Freq: Four times a day (QID) | ORAL | Status: DC | PRN

## 2018-08-06 MED ORDER — ACETAMINOPHEN 650 MG RE SUPP: 650.0000 mg | Freq: Four times a day (QID) | RECTAL | Status: DC | PRN

## 2018-08-06 MED ORDER — SODIUM CHLORIDE 0.9 % IV SOLN
1.0000 g | Freq: Once | INTRAVENOUS | Status: AC
  Administered 2018-08-06: 1 g via INTRAVENOUS
  Filled 2018-08-06: qty 10

## 2018-08-06 MED ORDER — MEMANTINE HCL 10 MG PO TABS
10.0000 mg | ORAL_TABLET | Freq: Two times a day (BID) | ORAL | Status: DC
  Administered 2018-08-06 – 2018-08-09 (×7): 10 mg via ORAL
  Filled 2018-08-06 (×7): qty 1

## 2018-08-06 MED ORDER — VITAMIN E 45 MG (100 UNIT) PO CAPS
200.0000 [IU] | ORAL_CAPSULE | Freq: Three times a day (TID) | ORAL | Status: DC
  Administered 2018-08-06 – 2018-08-09 (×10): 200 [IU] via ORAL
  Filled 2018-08-06 (×11): qty 2

## 2018-08-06 MED ORDER — ASPIRIN EC 81 MG PO TBEC
81.0000 mg | DELAYED_RELEASE_TABLET | Freq: Every day | ORAL | Status: DC
  Administered 2018-08-06 – 2018-08-09 (×4): 81 mg via ORAL
  Filled 2018-08-06 (×4): qty 1

## 2018-08-06 MED ORDER — OMEGA-3-ACID ETHYL ESTERS 1 G PO CAPS
1.0000 | ORAL_CAPSULE | Freq: Every day | ORAL | Status: DC
  Administered 2018-08-06 – 2018-08-09 (×4): 1 g via ORAL
  Filled 2018-08-06 (×4): qty 1

## 2018-08-06 MED ORDER — VENLAFAXINE HCL ER 75 MG PO CP24
75.0000 mg | ORAL_CAPSULE | Freq: Every day | ORAL | Status: DC
  Administered 2018-08-06 – 2018-08-09 (×4): 75 mg via ORAL
  Filled 2018-08-06 (×4): qty 1

## 2018-08-06 MED ORDER — SODIUM CHLORIDE 0.9 % IV SOLN
INTRAVENOUS | Status: AC
  Administered 2018-08-06: 07:00:00 via INTRAVENOUS

## 2018-08-06 MED ORDER — DONEPEZIL HCL 10 MG PO TABS
10.0000 mg | ORAL_TABLET | Freq: Every day | ORAL | Status: DC
  Administered 2018-08-06 – 2018-08-08 (×3): 10 mg via ORAL
  Filled 2018-08-06 (×3): qty 1

## 2018-08-06 MED ORDER — ACETAMINOPHEN 325 MG PO TABS: 650.0000 mg | ORAL_TABLET | Freq: Four times a day (QID) | ORAL | Status: DC | PRN

## 2018-08-06 NOTE — Plan of Care (Signed)
  Problem: Acute Rehab PT Goals(only PT should resolve) Goal: Pt Will Go Supine/Side To Sit Outcome: Progressing Flowsheets (Taken 08/06/2018 1338) Pt will go Supine/Side to Sit: with supervision Goal: Pt Will Go Sit To Supine/Side Outcome: Progressing Flowsheets (Taken 08/06/2018 1338) Pt will go Sit to Supine/Side: with supervision Goal: Patient Will Transfer Sit To/From Stand Outcome: Progressing Flowsheets (Taken 08/06/2018 1338) Patient will transfer sit to/from stand: with min guard assist Goal: Pt Will Transfer Bed To Chair/Chair To Bed Outcome: Progressing Flowsheets (Taken 08/06/2018 1338) Pt will Transfer Bed to Chair/Chair to Bed: with min assist Note:  With LRAD Goal: Pt Will Ambulate Outcome: Progressing Flowsheets (Taken 08/06/2018 1338) Pt will Ambulate: 15 feet; with minimal assist; with least restrictive assistive device; with rolling walker

## 2018-08-06 NOTE — ED Notes (Addendum)
Arville Go, legal guardian, called and wanted an update on pt. Told her I was in the process of providing care and don't have an update at this time. She said to call with an update at any time.

## 2018-08-06 NOTE — TOC Initial Note (Signed)
Transition of Care [facility]) - Initial/Assessment Note    Patient Details  Name: Shannon Blair MRN: [id number] Date of Birth: 02-Aug-1931  Transition of Care [facility]) CM/SW Contact:    Nila Nephew, LCSW Phone Number: [phone] coverage for [phone] 08/06/2018, 4:16 PM  Clinical Narrative:    Pt admitted from home where she resides alone. Pt's sister and friend/POA Arville Go help her at home daily as she has dementia and needs prompting to bathe and help to prepare meals, etc. Is fairly ambulatory at baseline, POA reports she has been unable to get OOB without assistance the past couple of days, and generally more confused than usual (typically will recognize close family friends, understand her surroundings). Pt was recently admitted with kidney failure and UTI, DC'd home and home health was following ([facility]).  Discussed disposition planning with POA (chart has her described as legal guardian but she reports pt is own guardian and that she provided POA paperwork to hospital during last admission). POA reports she and pt's sister who are her primary supports have been researching what it would take to have pt move into a supervised setting like assisted living, as they feel it is unlikely that pt will be able to manage much longer living alone. States she has been told pt's income is too great to qualify for medicaid currently but she is going to continue pursuing alternatives. Pt makes about $2100 monthly in SS and retirement benefits. Have also been looking into hiring caregivers for several hours per day to assist with supervision. For short term plans, CSW explained therapy recommendation for SNF. POA in agreement to begin referral process and she was understanding of covid19-related restrictions on admissions and visitors currently. Also understanding of insurance authorization process.            Completed FL2 and referrals, POA open to [facility] and [place] area  referrals.  Expected Discharge Plan: Skilled Nursing Facility Barriers to Discharge: Continued Medical Work up, Ship broker   Patient Goals and CMS Choice Patient states their goals for this hospitalization and ongoing recovery are:: unable to state CMS Medicare.gov Compare Post Acute Care list provided to:: Other (Comment Required)(will provide to POA once offers are made) Choice offered to / list presented to : [place] / Guardian  Expected Discharge Plan and Services Expected Discharge Plan: [place] In-house Referral: Clinical Social Work Discharge Planning Services: CM Consult Post Acute Care Choice: [place] arrangements for the past 2 months: Single Family Home Expected Discharge Date: (unknown)                                    Prior Living Arrangements/Services Living arrangements for the past 2 months: Single Family Home Lives with:: Self Patient language and need for interpreter reviewed:: No Do you feel safe going back to the place where you live?: Yes      Need for Family Participation in Patient Care: Yes (Comment)(pt with dementia, family and POA make care decisions) Care giver support system in place?: Yes (comment)(has sister, POA, and HH) Current home services: DME, Home OT, Home PT, Homehealth aide Criminal Activity/Legal Involvement Pertinent to Current Situation/Hospitalization: No - Comment as needed  Activities of Daily Living Home Assistive Devices/Equipment: Eyeglasses, Dentures (specify type), Walker (specify type), Cane (specify quad or straight)(rollator, single point cane, upper/lower partial plates) ADL Screening (condition at time of admission) Patient's cognitive ability adequate  to safely complete daily activities?: No Is the patient deaf or have difficulty hearing?: No Does the patient have difficulty seeing, even when wearing glasses/contacts?: No Does the patient have difficulty  concentrating, remembering, or making decisions?: Yes Patient able to express need for assistance with ADLs?: Yes Does the patient have difficulty dressing or bathing?: No Independently performs ADLs?: No Communication: Independent Dressing (OT): Needs assistance Is this a change from baseline?: Change from baseline, expected to last >3 days Grooming: Needs assistance Is this a change from baseline?: Change from baseline, expected to last >3 days Feeding: Needs assistance Is this a change from baseline?: Change from baseline, expected to last >3 days Bathing: Needs assistance Is this a change from baseline?: Change from baseline, expected to last >3 days Toileting: Dependent Is this a change from baseline?: Change from baseline, expected to last >3days In/Out Bed: Dependent Is this a change from baseline?: Change from baseline, expected to last >3 days Walks in Home: Dependent Is this a change from baseline?: Change from baseline, expected to last >3 days Does the patient have difficulty walking or climbing stairs?: Yes Weakness of Legs: Both Weakness of Arms/Hands: Both  Permission Sought/Granted Permission sought to share information with : Other (comment) Permission granted to share information with : Yes, Verbal Permission Granted  Share Information with NAME: Manya Silvas, friend/POA [phone]           Emotional Assessment Appearance:: Appears stated age     Orientation: : Oriented to Self Alcohol / Substance Use: Not Applicable Psych Involvement: No (comment)  Admission diagnosis:  Pyelonephritis [N12] Patient Active Problem List   Diagnosis Date Noted  . Acute encephalopathy 08/06/2018  . Pyelonephritis 08/06/2018  . ARF (acute renal failure) ([place]) 08/06/2018  . Hydronephrosis 08/06/2018  . UTI (urinary tract infection) 07/06/2018  . Dementia ([place])   . Spinal stenosis, lumbar region with neurogenic claudication 07/26/2016  . Pseudoaphakia 11/13/2011  .  Nonspecific (abnormal) findings on radiological and other examination of gastrointestinal tract 06/26/2011  . Colitis 06/25/2011  . Rectal bleeding 06/25/2011  . History of rectal or anal cancer 06/25/2011  . HTN (hypertension) 06/25/2011   PCP:  Merrilee Seashore, MD Pharmacy:   [place], [place] - [address] [place] [address] Phone: [phone] Fax: [phone]     Social Determinants of Health (SDOH) Interventions    Readmission Risk Interventions No flowsheet data found.

## 2018-08-06 NOTE — ED Notes (Signed)
ED TO INPATIENT HANDOFF REPORT  ED Nurse Name and Phone #: Judye Bos Name/Age/Gender Shannon Blair 85 y.o. female Room/Bed: WA19/WA19  Code Status   Code Status: Prior  Home/SNF/Other Home Patient oriented to: self Is this baseline? Unable to assess bc i am uncertain of her baseline d/t dementia  Triage Complete: Triage complete  Chief Complaint Weakness  Triage Note Patient is from home and transported via [place]. According to PTAR, patient was diagnosed with a UTI on last Monday. Patient continues to experiencing weakness.    Allergies No Known Allergies  Level of Care/Admitting Diagnosis ED Disposition    ED Disposition Condition Comment   Admit  Hospital Area: [place] [[id number]]  Level of Care: Telemetry [5]  Admit to tele based on following criteria: Monitor for Ischemic changes  Covid Evaluation: Screening Protocol (No Symptoms)  Diagnosis: Acute encephalopathy [950932]  Admitting Physician: Rise Patience [phone]  Attending Physician: Rise Patience Lei.Right  PT Class (Do Not Modify): Observation [104]  PT Acc Code (Do Not Modify): Observation [10022]       B Medical/Surgery History Past Medical History:  Diagnosis Date  . Anal cancer ([place]) 2004   Non-surgical mgt with chemo-radiation.  Dr Alen Blew follows.   . Anal fissure   . Colon, diverticulosis    Sigmoid. With associated stenosis.  Colonoscopy failed to intubate beyond stenosis in 08/2010.  rectal bx then negative for recurrent cancer.   . Dementia ([place])   . Hypertension   . OA (osteoarthritis)   . Squamous cell carcinoma    skin  . Varicose veins of left lower extremity with pain    Past Surgical History:  Procedure Laterality Date  . ABDOMINAL HYSTERECTOMY    . APPENDECTOMY    . BLADDER REPAIR    . CATARACT EXTRACTION  05/2004    dr Ishmael Holter  . LUMBAR LAMINECTOMY/DECOMPRESSION MICRODISCECTOMY N/A 07/26/2016   Procedure: Central decompression, lumbar laminectomy  L4-L5 and L5-S1, foraminotmy L5-S1 and L4-L5 bilateral;  Surgeon: Latanya Maudlin, MD;  Location: [facility];  Service: Orthopedics;  Laterality: N/A;  . NEPHRECTOMY  1970's   Rt. "Shrivelled up" ,  no cancer.   Marland Kitchen POLYPECTOMY       A IV Location/Drains/Wounds Patient Lines/Drains/Airways Status   Active Line/Drains/Airways    Name:   Placement date:   Placement time:   Site:   Days:   Peripheral IV 08/06/18 Left Forearm   08/06/18    0113    Forearm   less than 1   Incision (Closed) 07/26/16 Back Other (Comment)   07/26/16    1416     741          Intake/Output Last 24 hours  Intake/Output Summary (Last 24 hours) at 08/06/2018 0443 Last data filed at 08/06/2018 0319 Gross per 24 hour  Intake 1090 ml  Output -  Net 1090 ml    Labs/Imaging Results for orders placed or performed during the hospital encounter of 08/05/18 (from the past 48 hour(s))  CBC with Differential/Platelet     Status: Abnormal   Collection Time: 08/06/18  1:11 AM  Result Value Ref Range   WBC 13.3 (H) 4.0 - 10.5 K/uL   RBC 3.64 (L) 3.87 - 5.11 MIL/uL   Hemoglobin 10.6 (L) 12.0 - 15.0 g/dL   HCT 32.8 (L) 36.0 - 46.0 %   MCV 90.1 80.0 - 100.0 fL   MCH 29.1 26.0 - 34.0 pg   MCHC 32.3 30.0 - 36.0  g/dL   RDW 13.0 11.5 - 15.5 %   Platelets 214 150 - 400 K/uL   nRBC 0.0 0.0 - 0.2 %   Neutrophils Relative % 74 %   Neutro Abs 10.0 (H) 1.7 - 7.7 K/uL   Lymphocytes Relative 11 %   Lymphs Abs 1.4 0.7 - 4.0 K/uL   Monocytes Relative 7 %   Monocytes Absolute 0.9 0.1 - 1.0 K/uL   Eosinophils Relative 2 %   Eosinophils Absolute 0.2 0.0 - 0.5 K/uL   Basophils Relative 1 %   Basophils Absolute 0.1 0.0 - 0.1 K/uL   Immature Granulocytes 5 %   Abs Immature Granulocytes 0.61 (H) 0.00 - 0.07 K/uL    Comment: Performed at [facility], [place] [address], [place], [place] [address]  Comprehensive metabolic panel     Status: Abnormal   Collection Time: 08/06/18  1:11 AM  Result Value Ref Range   Sodium  135 135 - 145 mmol/L   Potassium 4.2 3.5 - 5.1 mmol/L   Chloride 104 98 - 111 mmol/L   CO2 20 (L) 22 - 32 mmol/L   Glucose, Bld 113 (H) 70 - 99 mg/dL   BUN 46 (H) 8 - 23 mg/dL   Creatinine, Ser 2.07 (H) 0.44 - 1.00 mg/dL   Calcium 9.4 8.9 - 10.3 mg/dL   Total Protein 6.5 6.5 - 8.1 g/dL   Albumin 3.0 (L) 3.5 - 5.0 g/dL   AST 51 (H) 15 - 41 U/L   ALT 81 (H) 0 - 44 U/L   Alkaline Phosphatase 253 (H) 38 - 126 U/L   Total Bilirubin 0.4 0.3 - 1.2 mg/dL   GFR calc non Af Amer 21 (L) >60 mL/min   GFR calc Af Amer 25 (L) >60 mL/min   Anion gap 11 5 - 15    Comment: Performed at [facility], [place] [address], [place], [place] [address]  Urinalysis, Routine w reflex microscopic     Status: Abnormal   Collection Time: 08/06/18  1:45 AM  Result Value Ref Range   Color, Urine YELLOW YELLOW   APPearance CLOUDY (A) CLEAR   Specific Gravity, Urine 1.012 1.005 - 1.030   pH 5.0 5.0 - 8.0   Glucose, UA NEGATIVE NEGATIVE mg/dL   Hgb urine dipstick SMALL (A) NEGATIVE   Bilirubin Urine NEGATIVE NEGATIVE   Ketones, ur NEGATIVE NEGATIVE mg/dL   Protein, ur 30 (A) NEGATIVE mg/dL   Nitrite POSITIVE (A) NEGATIVE   Leukocytes,Ua LARGE (A) NEGATIVE   RBC / HPF 11-20 0 - 5 RBC/hpf   WBC, UA >50 (H) 0 - 5 WBC/hpf   Bacteria, UA RARE (A) NONE SEEN   WBC Clumps PRESENT     Comment: Performed at [facility], [place] [address], [place], [place] [address]   Ct Renal Stone Study  Result Date: 08/06/2018 CLINICAL DATA:  Hematuria with unknown cause EXAM: CT ABDOMEN AND PELVIS WITHOUT CONTRAST TECHNIQUE: Multidetector CT imaging of the abdomen and pelvis was performed following the standard protocol without IV contrast. COMPARISON:  None. FINDINGS: Lower chest: Atelectasis or scarring in the lower lungs that is mild. Hepatobiliary: No focal liver abnormality.No evidence of biliary obstruction or stone. Pancreas: Generalized atrophy Spleen: Generous splenic size, stable from 2017  chest CT. Adrenals/Urinary Tract: Negative adrenals. Right nephrectomy. Prominent left hydroureteronephrosis to the level of the urinary bladder which is circumferentially thick walled. There is dependent soft tissue density with fluid fluid level in the bladder. Punctate  left renal calculus. No obstructing stone. Unremarkable bladder. Stomach/Bowel: No obstruction. No evidence of acute bowel inflammation. No gross mass in this patient with history of remote anal cancer. Sigmoid diverticulosis. Vascular/Lymphatic: Diffuse atherosclerotic calcification. No mass or adenopathy. Reproductive:Hysterectomy. Other: Stranding in the pelvis attributed to scarring from prior radiotherapy. No ascites or pneumoperitoneum Musculoskeletal: Spinal degeneration and bilateral hip osteoarthritis. Remote bilateral eleventh rib fractures. Remote right twelfth rib fracture. IMPRESSION: 1. Hydroureteronephrosis above the thick walled bladder which contains debris. There is history of UTI; the patient has also had prior pelvic radiotherapy. The left kidney is solitary. 2. Punctate left renal calculus. Electronically Signed   By: Monte Fantasia M.D.   On: 08/06/2018 04:29    Pending Labs Unresulted Labs (From admission, onward)    Start     Ordered   08/06/18 0353  SARS Coronavirus 2 (CEPHEID - Performed in [place] hospital lab), [facility] Order  (Asymptomatic Patients Labs)  ONCE - STAT,   R    Question:  Rule Out  Answer:  Yes   08/06/18 0352          Vitals/Pain Today's Vitals   08/06/18 0315 08/06/18 0330 08/06/18 0430 08/06/18 0432  BP:  (!) 121/51 (!) 114/43   Pulse: 74 78 71   Resp: (!) 29 (!) 25 (!) 23   Temp:      TempSrc:      SpO2: 96% 96% 98%   Weight:      Height:      PainSc:    0-No pain    Isolation Precautions No active isolations  Medications Medications  sodium chloride 0.9 % bolus 1,000 mL (0 mLs Intravenous Stopped 08/06/18 0238)  cefTRIAXone (ROCEPHIN) 1 g in sodium chloride 0.9 % 100  mL IVPB (0 g Intravenous Stopped 08/06/18 0319)    Mobility walks with device High fall risk   Focused Assessments NA   R Recommendations: See Admitting Provider Note  Report given to:   Additional Notes: NA

## 2018-08-06 NOTE — Consult Note (Addendum)
Reason for Consult:Left Hydronephrosis, Left Solitary Kidney, Acute Renal Failure, Recurrent Cystitis  Referring Physician: Addison [place] MD  Shannon Blair is an 85 y.o. female.   HPI:   1 - Left Hydronephrosis - mild-mod left hydro by ER CT 07/2018 on eval recurrent cystitis, new since 2013. She has h/o pelvic radiation for anal cancer. She is NOT a candidate for any major surgery.    2 - Left Solitary Kidney - s/p open right nephrectomy for atrophic kidney.   3 -Acute Renal Failure - baseline Cr around 1.1. Several episodes recurrent ART 2020 during episodes of cystitis with peaks as high as 3. Mile left hydro as per above.   4 - Recurrent Cystitis - few episodes of cystitis 2020. Symptoms worsening dementia / mental status. Mot recetnly e. Coli Res Bactrim 06/2018. [facility] 5/19 pending, placed on empiric rocephin.   5 - Living Situation / Dementia - pt with severe dementia at baseline and needs help with ADL's. Her sister Shannon Blair [phone] who lives close has offered to help care for her or even have pt live with her, but pt has  "guardian" who lives out of town and has limited day to day involvment.   PMH sig for severe dementia (pt currently living with a guardian who may not be taking appropriate care of her), HTN, Anal cancer s/p chemo-XRT 2018, back surgery. Her PCP is Shannon Seashore MD.  Today " Shannon [place]" is seen in consultation for above.   Past Medical History:  Diagnosis Date  . Anal cancer ([place]) 2004   Non-surgical mgt with chemo-radiation.  Dr Alen Blew follows.   . Anal fissure   . Colon, diverticulosis    Sigmoid. With associated stenosis.  Colonoscopy failed to intubate beyond stenosis in 08/2010.  rectal bx then negative for recurrent cancer.   . Dementia ([place])   . Hypertension   . OA (osteoarthritis)   . Squamous cell carcinoma    skin  . Varicose veins of left lower extremity with pain     Past Surgical History:  Procedure Laterality Date  . ABDOMINAL  HYSTERECTOMY    . APPENDECTOMY    . BLADDER REPAIR    . CATARACT EXTRACTION  05/2004    dr Ishmael Holter  . LUMBAR LAMINECTOMY/DECOMPRESSION MICRODISCECTOMY N/A 07/26/2016   Procedure: Central decompression, lumbar laminectomy L4-L5 and L5-S1, foraminotmy L5-S1 and L4-L5 bilateral;  Surgeon: Latanya Maudlin, MD;  Location: [facility];  Service: Orthopedics;  Laterality: N/A;  . NEPHRECTOMY  1970's   Rt. "Shrivelled up" ,  no cancer.   Marland Kitchen POLYPECTOMY      Family History  Problem Relation Age of Onset  . Coronary artery disease Father   . Leukemia Brother   . Colon cancer Brother   . Breast cancer Sister   . Ovarian cancer Sister   . Diabetes Daughter     Social History:  reports that she has never smoked. She has never used smokeless tobacco. She reports that she does not drink alcohol or use drugs.  Allergies: No Known Allergies  Medications: I have reviewed the patient's current medications.  Results for orders placed or performed during the hospital encounter of 08/05/18 (from the past 48 hour(s))  CBC with Differential/Platelet     Status: Abnormal   Collection Time: 08/06/18  1:11 AM  Result Value Ref Range   WBC 13.3 (H) 4.0 - 10.5 K/uL   RBC 3.64 (L) 3.87 - 5.11 MIL/uL   Hemoglobin 10.6 (L) 12.0 - 15.0 g/dL  HCT 32.8 (L) 36.0 - 46.0 %   MCV 90.1 80.0 - 100.0 fL   MCH 29.1 26.0 - 34.0 pg   MCHC 32.3 30.0 - 36.0 g/dL   RDW 13.0 11.5 - 15.5 %   Platelets 214 150 - 400 K/uL   nRBC 0.0 0.0 - 0.2 %   Neutrophils Relative % 74 %   Neutro Abs 10.0 (H) 1.7 - 7.7 K/uL   Lymphocytes Relative 11 %   Lymphs Abs 1.4 0.7 - 4.0 K/uL   Monocytes Relative 7 %   Monocytes Absolute 0.9 0.1 - 1.0 K/uL   Eosinophils Relative 2 %   Eosinophils Absolute 0.2 0.0 - 0.5 K/uL   Basophils Relative 1 %   Basophils Absolute 0.1 0.0 - 0.1 K/uL   Immature Granulocytes 5 %   Abs Immature Granulocytes 0.61 (H) 0.00 - 0.07 K/uL    Comment: Performed at [facility], [place] [address], [place], [place] [address]  Comprehensive metabolic panel     Status: Abnormal   Collection Time: 08/06/18  1:11 AM  Result Value Ref Range   Sodium 135 135 - 145 mmol/L   Potassium 4.2 3.5 - 5.1 mmol/L   Chloride 104 98 - 111 mmol/L   CO2 20 (L) 22 - 32 mmol/L   Glucose, Bld 113 (H) 70 - 99 mg/dL   BUN 46 (H) 8 - 23 mg/dL   Creatinine, Ser 2.07 (H) 0.44 - 1.00 mg/dL   Calcium 9.4 8.9 - 10.3 mg/dL   Total Protein 6.5 6.5 - 8.1 g/dL   Albumin 3.0 (L) 3.5 - 5.0 g/dL   AST 51 (H) 15 - 41 U/L   ALT 81 (H) 0 - 44 U/L   Alkaline Phosphatase 253 (H) 38 - 126 U/L   Total Bilirubin 0.4 0.3 - 1.2 mg/dL   GFR calc non Af Amer 21 (L) >60 mL/min   GFR calc Af Amer 25 (L) >60 mL/min   Anion gap 11 5 - 15    Comment: Performed at [facility], [place] [address] [place], [place] [address]  Urinalysis, Routine w reflex microscopic     Status: Abnormal   Collection Time: 08/06/18  1:45 AM  Result Value Ref Range   Color, Urine YELLOW YELLOW   APPearance CLOUDY (A) CLEAR   Specific Gravity, Urine 1.012 1.005 - 1.030   pH 5.0 5.0 - 8.0   Glucose, UA NEGATIVE NEGATIVE mg/dL   Hgb urine dipstick SMALL (A) NEGATIVE   Bilirubin Urine NEGATIVE NEGATIVE   Ketones, ur NEGATIVE NEGATIVE mg/dL   Protein, ur 30 (A) NEGATIVE mg/dL   Nitrite POSITIVE (A) NEGATIVE   Leukocytes,Ua LARGE (A) NEGATIVE   RBC / HPF 11-20 0 - 5 RBC/hpf   WBC, UA >50 (H) 0 - 5 WBC/hpf   Bacteria, UA RARE (A) NONE SEEN   WBC Clumps PRESENT     Comment: Performed at [facility], [place] [address] [place], [place] [address]  SARS Coronavirus 2 (CEPHEID - Performed in [place] hospital lab), Hosp Order     Status: None   Collection Time: 08/06/18  4:31 AM  Result Value Ref Range   SARS Coronavirus 2 NEGATIVE NEGATIVE    Comment: (NOTE) If result is NEGATIVE SARS-CoV-2 target nucleic acids are NOT DETECTED. The SARS-CoV-2 RNA is generally detectable in upper and lower  respiratory specimens  during the acute phase of infection. The lowest  concentration of SARS-CoV-2 viral copies this assay can  detect is 250  copies / mL. A negative result does not preclude SARS-CoV-2 infection  and should not be used as the sole basis for treatment or other  patient management decisions.  A negative result may occur with  improper specimen collection / handling, submission of specimen other  than nasopharyngeal swab, presence of viral mutation(s) within the  areas targeted by this assay, and inadequate number of viral copies  (<250 copies / mL). A negative result must be combined with clinical  observations, patient history, and epidemiological information. If result is POSITIVE SARS-CoV-2 target nucleic acids are DETECTED. The SARS-CoV-2 RNA is generally detectable in upper and lower  respiratory specimens dur ing the acute phase of infection.  Positive  results are indicative of active infection with SARS-CoV-2.  Clinical  correlation with patient history and other diagnostic information is  necessary to determine patient infection status.  Positive results do  not rule out bacterial infection or co-infection with other viruses. If result is PRESUMPTIVE POSTIVE SARS-CoV-2 nucleic acids MAY BE PRESENT.   A presumptive positive result was obtained on the submitted specimen  and confirmed on repeat testing.  While 2019 novel coronavirus  (SARS-CoV-2) nucleic acids may be present in the submitted sample  additional confirmatory testing may be necessary for epidemiological  and / or clinical management purposes  to differentiate between  SARS-CoV-2 and other Sarbecovirus currently known to infect humans.  If clinically indicated additional testing with an alternate test  methodology [phone]) is advised. The SARS-CoV-2 RNA is generally  detectable in upper and lower respiratory sp ecimens during the acute  phase of infection. The expected result is Negative. Fact Sheet for Patients:   StrictlyIdeas.no Fact Sheet for Healthcare Providers: BankingDealers.co.za This test is not yet approved or cleared by the [place] FDA and has been authorized for detection and/or diagnosis of SARS-CoV-2 by FDA under an Emergency Use Authorization (EUA).  This EUA will remain in effect (meaning this test can be used) for the duration of the COVID-19 declaration under Section 564(b)(1) of the Act, 21 U.S.C. section 360bbb-3(b)(1), unless the authorization is terminated or revoked sooner. Performed at [facility], [place] [address], [place], [place] [address]     Ct Renal Stone Study  Result Date: 08/06/2018 CLINICAL DATA:  Hematuria with unknown cause EXAM: CT ABDOMEN AND PELVIS WITHOUT CONTRAST TECHNIQUE: Multidetector CT imaging of the abdomen and pelvis was performed following the standard protocol without IV contrast. COMPARISON:  None. FINDINGS: Lower chest: Atelectasis or scarring in the lower lungs that is mild. Hepatobiliary: No focal liver abnormality.No evidence of biliary obstruction or stone. Pancreas: Generalized atrophy Spleen: Generous splenic size, stable from 2017 chest CT. Adrenals/Urinary Tract: Negative adrenals. Right nephrectomy. Prominent left hydroureteronephrosis to the level of the urinary bladder which is circumferentially thick walled. There is dependent soft tissue density with fluid fluid level in the bladder. Punctate left renal calculus. No obstructing stone. Unremarkable bladder. Stomach/Bowel: No obstruction. No evidence of acute bowel inflammation. No gross mass in this patient with history of remote anal cancer. Sigmoid diverticulosis. Vascular/Lymphatic: Diffuse atherosclerotic calcification. No mass or adenopathy. Reproductive:Hysterectomy. Other: Stranding in the pelvis attributed to scarring from prior radiotherapy. No ascites or pneumoperitoneum Musculoskeletal: Spinal degeneration and  bilateral hip osteoarthritis. Remote bilateral eleventh rib fractures. Remote right twelfth rib fracture. IMPRESSION: 1. Hydroureteronephrosis above the thick walled bladder which contains debris. There is history of UTI; the patient has also had prior pelvic radiotherapy. The left kidney is solitary. 2. Punctate  left renal calculus. Electronically Signed   By: Monte Fantasia M.D.   On: 08/06/2018 04:29    Review of Systems  Unable to perform ROS: Dementia  All other systems reviewed and are negative.  Blood pressure (!) 128/51, pulse 71, temperature 98.4 F (36.9 C), temperature source Oral, resp. rate 19, height 5\' 4"  (1.626 m), weight 59 kg, SpO2 91 %. Physical Exam  Constitutional:  AO x 1. Stigmata of severe dementia and parkinsonism with resting tremmors.   HENT:  Head: Normocephalic.  Eyes: Pupils are equal, round, and reactive to light.  Neck: Normal range of motion.  Cardiovascular: Normal rate.  Respiratory: Effort normal.  GI:  Mild obesity. Rt flank scar w/o hernias.   Genitourinary:    Genitourinary Comments: Foley in place with yellow urine that is non-foul.    Skin: Skin is warm.  Psychiatric: She has a normal mood and affect.    Assessment/Plan:  1 - Left Hydronephrosis - likely 2/2 cystitis (transient) v. Distal stricture from prior radiation. She is very poor operative candidate with little functional capacity. Pt is unable to understand her problem. Very high threshold for procedural intervention. If GFR fails to improve, consider nuc med renogram v. Palliative transition.   2 - Left Solitary Kidney - stable.   3 -Acute Renal Failure - likely multifactorial 2/2 dehydration and some possible partial obstruction of left kidney.   4 - Recurrent Cystitis - agree with current ABX based on prior CX data. She is not septic.   5 - Living Situation / Dementia - there are significant social barriers to safe discharge. Pt's sister Shannon Blair has understandable reservation  about pt's ability to care for herself and I agree. May need facility placement or DC to live with sister when appropriate.   Alexis Frock 08/06/2018, 7:46 AM

## 2018-08-06 NOTE — Plan of Care (Signed)
  Problem: Clinical Measurements: Goal: Diagnostic test results will improve Outcome: Progressing   Problem: Clinical Measurements: Goal: Respiratory complications will improve Outcome: Progressing   Problem: Clinical Measurements: Goal: Cardiovascular complication will be avoided Outcome: Progressing   Problem: Activity: Goal: Risk for activity intolerance will decrease Outcome: Progressing   Problem: Skin Integrity: Goal: Risk for impaired skin integrity will decrease Outcome: Progressing

## 2018-08-06 NOTE — ED Provider Notes (Addendum)
[facility] DEPT Provider Note  CSN: [id number] Arrival date & time: 08/05/18 2347  Chief Complaint(s) Weakness  HPI Shannon Blair is a 85 y.o. female with a past medical history of dementia, anal cancer, who presents to the emergency department with several days of fatigue.  Patient lives at home alone but has power of attorney and family that come by and assist when they can.  Patient was recently admitted last month for similar presentation and noted to be dehydrated with a urinary tract infection and hypercalcemia.  Power of attorney reported that the patient been having difficulty ambulating and has been feeling more fatigued.  She reported that patient typically is able to get up and do things herself except cooking which they do not allow her to do.  She has not been able to do this for several days.  Otherwise patient is at her baseline mental status.  Power of attorney did report that patient has been complaining of lower back pain.  Remainder of history, ROS, and physical exam limited due to patient's condition (dementia). Additional information was obtained from [place].   Level V Caveat.    HPI  Past Medical History Past Medical History:  Diagnosis Date  . Anal cancer ([place]) 2004   Non-surgical mgt with chemo-radiation.  Dr Alen Blew follows.   . Anal fissure   . Colon, diverticulosis    Sigmoid. With associated stenosis.  Colonoscopy failed to intubate beyond stenosis in 08/2010.  rectal bx then negative for recurrent cancer.   . Dementia ([place])   . Hypertension   . OA (osteoarthritis)   . Squamous cell carcinoma    skin  . Varicose veins of left lower extremity with pain    Patient Active Problem List   Diagnosis Date Noted  . UTI (urinary tract infection) 07/06/2018  . Dementia ([place])   . Spinal stenosis, lumbar region with neurogenic claudication 07/26/2016  . Pseudoaphakia 11/13/2011  . Nonspecific (abnormal) findings on radiological  and other examination of gastrointestinal tract 06/26/2011  . Colitis 06/25/2011  . Rectal bleeding 06/25/2011  . History of rectal or anal cancer 06/25/2011  . HTN (hypertension) 06/25/2011   Home Medication(s) Prior to Admission medications   Medication Sig Start Date End Date Taking? Authorizing Provider  acetaminophen (TYLENOL) 325 MG tablet Take 650 mg by mouth every 6 (six) hours as needed for mild pain.    Yes [provider]  aspirin EC 81 MG tablet Take 81 mg by mouth daily.   Yes [provider]  donepezil (ARICEPT) 10 MG tablet Take 10 mg by mouth at bedtime.  09/25/14  Yes [provider]  irbesartan-hydrochlorothiazide (AVALIDE) 300-12.5 MG tablet Take 1 tablet by mouth daily. 05/07/18  Yes [provider]  memantine (NAMENDA) 10 MG tablet Take 10 mg by mouth 2 (two) times daily. 04/20/16  Yes [provider]  Misc Natural Products (OSTEO BI-FLEX ADV JOINT SHIELD PO) Take 1 tablet by mouth daily.     Yes [provider]  Multiple Vitamin (MULITIVITAMIN WITH MINERALS) TABS Take 1 tablet by mouth daily.   Yes [provider]  naproxen sodium (ANAPROX) 220 MG tablet Take 440 mg by mouth 2 (two) times daily as needed (for pain).   Yes [provider]  Omega-3 Fatty Acids (FISH OIL) 1000 MG CAPS Take 1 capsule by mouth daily.     Yes [provider]  oxybutynin (DITROPAN) 5 MG tablet Take 5 mg by mouth 2 (two) times  daily. 09/08/14  Yes [provider]  venlafaxine XR (EFFEXOR-XR) 75 MG 24 hr capsule Take 75 mg by mouth daily with breakfast.   Yes [provider]  vitamin C (ASCORBIC ACID) 500 MG tablet Take 500 mg by mouth daily.     Yes [provider]  vitamin E 200 UNIT capsule Take 200 Units by mouth 3 (three) times daily.     Yes [provider]                                                                                                                                     Past Surgical History Past Surgical History:  Procedure Laterality Date  . ABDOMINAL HYSTERECTOMY    . APPENDECTOMY    . BLADDER REPAIR    . CATARACT EXTRACTION  05/2004    dr Ishmael Holter  . LUMBAR LAMINECTOMY/DECOMPRESSION MICRODISCECTOMY N/A 07/26/2016   Procedure: Central decompression, lumbar laminectomy L4-L5 and L5-S1, foraminotmy L5-S1 and L4-L5 bilateral;  Surgeon: Latanya Maudlin, MD;  Location: [facility];  Service: Orthopedics;  Laterality: N/A;  . NEPHRECTOMY  1970's   Rt. "Shrivelled up" ,  no cancer.   Marland Kitchen POLYPECTOMY     Family History Family History  Problem Relation Age of Onset  . Coronary artery disease Father   . Leukemia Brother   . Colon cancer Brother   . Breast cancer Sister   . Ovarian cancer Sister   . Diabetes Daughter     Social History Social History   Tobacco Use  . Smoking status: Never Smoker  . Smokeless tobacco: Never Used  Substance Use Topics  . Alcohol use: No  . Drug use: No   Allergies Patient has no known allergies.  Review of Systems Review of Systems  Unable to perform ROS: Dementia    Physical Exam Vital Signs  I have reviewed the triage vital signs BP 117/64   Pulse 76   Temp 98.3 F (36.8 C) (Oral)   Resp 20   Ht 5\' 4"  (1.626 m)   Wt 59 kg   SpO2 98%   BMI 22.31 kg/m   Physical Exam Vitals signs reviewed.  Constitutional:      General: She is not in acute distress.    Appearance: She is well-developed. She is not diaphoretic.  HENT:     Head: Normocephalic and atraumatic.     Nose: Nose normal.  Eyes:     General: No scleral icterus.       Right eye: No discharge.        Left eye: No discharge.     Conjunctiva/sclera: Conjunctivae normal.     Pupils: Pupils are equal, round, and reactive to light.  Neck:     Musculoskeletal: Normal range of motion and neck supple.  Cardiovascular:     Rate and Rhythm: Normal rate and regular rhythm.     Heart sounds: No murmur. No friction rub.  No gallop.   Pulmonary:      Effort: Pulmonary effort is normal. No respiratory distress.     Breath sounds: Normal breath sounds. No stridor. No rales.  Abdominal:     General: There is no distension.     Palpations: Abdomen is soft.     Tenderness: There is no abdominal tenderness.  Musculoskeletal:        General: No tenderness.  Skin:    General: Skin is warm and dry.     Findings: No erythema or rash.  Neurological:     Mental Status: She is alert. She is disoriented.     Comments: Oriented to self     ED Results and Treatments Labs (all labs ordered are listed, but only abnormal results are displayed) Labs Reviewed  CBC WITH DIFFERENTIAL/PLATELET - Abnormal; Notable for the following components:      Result Value   WBC 13.3 (*)    RBC 3.64 (*)    Hemoglobin 10.6 (*)    HCT 32.8 (*)    Neutro Abs 10.0 (*)    Abs Immature Granulocytes 0.61 (*)    All other components within normal limits  COMPREHENSIVE METABOLIC PANEL - Abnormal; Notable for the following components:   CO2 20 (*)    Glucose, Bld 113 (*)    BUN 46 (*)    Creatinine, Ser 2.07 (*)    Albumin 3.0 (*)    AST 51 (*)    ALT 81 (*)    Alkaline Phosphatase 253 (*)    GFR calc non Af Amer 21 (*)    GFR calc Af Amer 25 (*)    All other components within normal limits  URINALYSIS, ROUTINE W REFLEX MICROSCOPIC - Abnormal; Notable for the following components:   APPearance CLOUDY (*)    Hgb urine dipstick SMALL (*)    Protein, ur 30 (*)    Nitrite POSITIVE (*)    Leukocytes,Ua LARGE (*)    WBC, UA >50 (*)    Bacteria, UA RARE (*)    All other components within normal limits                                                                                                                         EKG  EKG Interpretation  Date/Time:    Ventricular Rate:    PR Interval:    QRS Duration:   QT Interval:    QTC Calculation:   R Axis:     Text Interpretation:        Radiology No results found. Pertinent labs & imaging results that  were available during my care of the patient were reviewed by me and considered in my medical decision making (see chart for details).  Medications Ordered in ED Medications  sodium chloride 0.9 % bolus 1,000 mL (0 mLs Intravenous Stopped 08/06/18 0238)  cefTRIAXone (ROCEPHIN) 1 g in sodium chloride 0.9 % 100 mL IVPB (1 g Intravenous New Bag/Given 08/06/18 0249)  Procedures Procedures  (including critical care time)  Medical Decision Making / ED Course I have reviewed the nursing notes for this encounter and the patient's prior records (if available in EHR or on provided paperwork).    Patient presents with increased fatigue with decrease in ADLs.  UA concerning for possible urinary tract infection.  Patient is afebrile with stable vital signs.  Work-up notable for leukocytosis and urinary tract infection with mild AKI.  AKI likely from dehydration.  Given the reported complaint of pain, will treat for pyelonephritis.  Patient does not appear to be septic at this time.   On review of records, patient grew out E. coli susceptible to cephalosporins.  Given IV Rocephin.  Will discuss admission with medicine.   Final Clinical Impression(s) / ED Diagnoses Final diagnoses:  Pyelonephritis      This chart was dictated using voice recognition software.  Despite best efforts to proofread,  errors can occur which can change the documentation meaning.     Fatima Blank, MD 08/06/18 [id number]

## 2018-08-06 NOTE — H&P (Addendum)
History and Physical    IMONI KOHEN WRU:[id number] DOB: May 26, 1931 DOA: 08/05/2018  PCP: Merrilee Seashore, MD  Patient coming from: Home.  Chief Complaint: Increasing weakness and confusion.  HPI: TENNILE STYLES is a 85 y.o. female with history of hypertension, dementia, and anal cancer was recently admitted last month for confusion and at that time patient was found to be having hypercalcemia renal failure and UTI was subsequently discharged home was found by family that the last few days patient has become increasingly lethargic poor p.o. intake and decreased ambulation.  Patient was finding it to ambulate because patient found weak to walk and her legs were giving way.  Patient also complained of low back pain.  Has not had any chest pain nausea vomiting or diarrhea.  Most of the history was obtained from the patient's healthcare power of attorney who discussed with the ER physician.  ED Course: In the ER patient is oriented to her name follows commands moves all extremities labs show creatinine of 2.07 which is increased from 1.2 when patient was discharged last month.  AST is 51 ALT 81 anion gap is 11.  WBC count is 13.3 hemoglobin 10.6 platelets 214.  Urine shows more than 50 WBC large leukocyte esterase nitrite is positive bacteria rare RBC 11-20.  Given the patient has been recurrent UTI with renal failure CT renal study was done which shows left kidney with hydroureteronephrosis with thick-walled bladder and debris's.  Prior radiation changes seen.  Patient was started on fluids 1 L normal saline as a bolus and ceftriaxone started after urine cultures obtained.  Patient admitted for further management of weakness encephalopathy and acute renal failure with UTI.  Review of Systems: As per HPI, rest all negative.   Past Medical History:  Diagnosis Date  . Anal cancer ([place]) 2004   Non-surgical mgt with chemo-radiation.  Dr Alen Blew follows.   . Anal fissure   . Colon,  diverticulosis    Sigmoid. With associated stenosis.  Colonoscopy failed to intubate beyond stenosis in 08/2010.  rectal bx then negative for recurrent cancer.   . Dementia ([place])   . Hypertension   . OA (osteoarthritis)   . Squamous cell carcinoma    skin  . Varicose veins of left lower extremity with pain     Past Surgical History:  Procedure Laterality Date  . ABDOMINAL HYSTERECTOMY    . APPENDECTOMY    . BLADDER REPAIR    . CATARACT EXTRACTION  05/2004    dr Ishmael Holter  . LUMBAR LAMINECTOMY/DECOMPRESSION MICRODISCECTOMY N/A 07/26/2016   Procedure: Central decompression, lumbar laminectomy L4-L5 and L5-S1, foraminotmy L5-S1 and L4-L5 bilateral;  Surgeon: Latanya Maudlin, MD;  Location: [facility];  Service: Orthopedics;  Laterality: N/A;  . NEPHRECTOMY  1970's   Rt. "Shrivelled up" ,  no cancer.   Marland Kitchen POLYPECTOMY       reports that she has never smoked. She has never used smokeless tobacco. She reports that she does not drink alcohol or use drugs.  No Known Allergies  Family History  Problem Relation Age of Onset  . Coronary artery disease Father   . Leukemia Brother   . Colon cancer Brother   . Breast cancer Sister   . Ovarian cancer Sister   . Diabetes Daughter     Prior to Admission medications   Medication Sig Start Date End Date Taking? Authorizing Provider  acetaminophen (TYLENOL) 325 MG tablet Take 650 mg by mouth every 6 (six) hours as needed for  mild pain.    Yes [provider]  aspirin EC 81 MG tablet Take 81 mg by mouth daily.   Yes [provider]  donepezil (ARICEPT) 10 MG tablet Take 10 mg by mouth at bedtime.  09/25/14  Yes [provider]  irbesartan-hydrochlorothiazide (AVALIDE) 300-12.5 MG tablet Take 1 tablet by mouth daily. 05/07/18  Yes [provider]  memantine (NAMENDA) 10 MG tablet Take 10 mg by mouth 2 (two) times daily. 04/20/16  Yes [provider]  Misc Natural Products (OSTEO BI-FLEX ADV JOINT SHIELD PO) Take 1  tablet by mouth daily.     Yes [provider]  Multiple Vitamin (MULITIVITAMIN WITH MINERALS) TABS Take 1 tablet by mouth daily.   Yes [provider]  naproxen sodium (ANAPROX) 220 MG tablet Take 440 mg by mouth 2 (two) times daily as needed (for pain).   Yes [provider]  Omega-3 Fatty Acids (FISH OIL) 1000 MG CAPS Take 1 capsule by mouth daily.     Yes [provider]  oxybutynin (DITROPAN) 5 MG tablet Take 5 mg by mouth 2 (two) times daily. 09/08/14  Yes [provider]  venlafaxine XR (EFFEXOR-XR) 75 MG 24 hr capsule Take 75 mg by mouth daily with breakfast.   Yes [provider]  vitamin C (ASCORBIC ACID) 500 MG tablet Take 500 mg by mouth daily.     Yes [provider]  vitamin E 200 UNIT capsule Take 200 Units by mouth 3 (three) times daily.     Yes [provider]    Physical Exam: Vitals:   08/06/18 0315 08/06/18 0330 08/06/18 0430 08/06/18 0502  BP:  (!) 121/51 (!) 114/43 (!) 128/51  Pulse: 74 78 71 71  Resp: (!) 29 (!) 25 (!) 23 19  Temp:    98.4 F (36.9 C)  TempSrc:    Oral  SpO2: 96% 96% 98% 91%  Weight:      Height:          Constitutional: Moderately built and nourished. Vitals:   08/06/18 0315 08/06/18 0330 08/06/18 0430 08/06/18 0502  BP:  (!) 121/51 (!) 114/43 (!) 128/51  Pulse: 74 78 71 71  Resp: (!) 29 (!) 25 (!) 23 19  Temp:    98.4 F (36.9 C)  TempSrc:    Oral  SpO2: 96% 96% 98% 91%  Weight:      Height:       Eyes: Anicteric no pallor. ENMT: No discharge from the ears eyes nose and mouth. Neck: No mass or.  No neck rigidity. Respiratory: No rhonchi or crepitations. Cardiovascular: S1-S2 heard. Abdomen: Soft nontender bowel sounds present. Musculoskeletal: No edema.  No joint effusion. Skin: No rash. Neurologic: Alert awake oriented to her name.  Follows commands moves all extremities. Psychiatric: Oriented to name only.   Labs on Admission: I have personally reviewed  following labs and imaging studies  CBC: Recent Labs  Lab 08/06/18 0111  WBC 13.3*  NEUTROABS 10.0*  HGB 10.6*  HCT 32.8*  MCV 90.1  PLT 412   Basic Metabolic Panel: Recent Labs  Lab 08/06/18 0111  NA 135  K 4.2  CL 104  CO2 20*  GLUCOSE 113*  BUN 46*  CREATININE 2.07*  CALCIUM 9.4   GFR: Estimated Creatinine Clearance: 17.5 mL/min (A) (by C-G formula based on SCr of 2.07 mg/dL (H)). Liver Function Tests: Recent Labs  Lab 08/06/18 0111  AST 51*  ALT 81*  ALKPHOS 253*  BILITOT  0.4  PROT 6.5  ALBUMIN 3.0*   No results for input(s): LIPASE, AMYLASE in the last 168 hours. No results for input(s): AMMONIA in the last 168 hours. Coagulation Profile: No results for input(s): INR, PROTIME in the last 168 hours. Cardiac Enzymes: No results for input(s): CKTOTAL, CKMB, CKMBINDEX, TROPONINI in the last 168 hours. BNP (last 3 results) No results for input(s): PROBNP in the last 8760 hours. HbA1C: No results for input(s): HGBA1C in the last 72 hours. CBG: No results for input(s): GLUCAP in the last 168 hours. Lipid Profile: No results for input(s): CHOL, HDL, LDLCALC, TRIG, CHOLHDL, LDLDIRECT in the last 72 hours. Thyroid Function Tests: No results for input(s): TSH, T4TOTAL, FREET4, T3FREE, THYROIDAB in the last 72 hours. Anemia Panel: No results for input(s): VITAMINB12, FOLATE, FERRITIN, TIBC, IRON, RETICCTPCT in the last 72 hours. Urine analysis:    Component Value Date/Time   COLORURINE YELLOW 08/06/2018 0145   APPEARANCEUR CLOUDY (A) 08/06/2018 0145   LABSPEC 1.012 08/06/2018 0145   PHURINE 5.0 08/06/2018 0145   GLUCOSEU NEGATIVE 08/06/2018 0145   HGBUR SMALL (A) 08/06/2018 0145   BILIRUBINUR NEGATIVE 08/06/2018 0145   KETONESUR NEGATIVE 08/06/2018 0145   PROTEINUR 30 (A) 08/06/2018 0145   UROBILINOGEN 0.2 06/25/2011 1028   NITRITE POSITIVE (A) 08/06/2018 0145   LEUKOCYTESUR LARGE (A) 08/06/2018 0145   Sepsis Labs:  @LABRCNTIP (procalcitonin:4,lacticidven:4) ) Recent Results (from the past 240 hour(s))  SARS Coronavirus 2 (CEPHEID - Performed in [place] hospital lab), Hosp Order     Status: None   Collection Time: 08/06/18  4:31 AM  Result Value Ref Range Status   SARS Coronavirus 2 NEGATIVE NEGATIVE Final    Comment: (NOTE) If result is NEGATIVE SARS-CoV-2 target nucleic acids are NOT DETECTED. The SARS-CoV-2 RNA is generally detectable in upper and lower  respiratory specimens during the acute phase of infection. The lowest  concentration of SARS-CoV-2 viral copies this assay can detect is 250  copies / mL. A negative result does not preclude SARS-CoV-2 infection  and should not be used as the sole basis for treatment or other  patient management decisions.  A negative result may occur with  improper specimen collection / handling, submission of specimen other  than nasopharyngeal swab, presence of viral mutation(s) within the  areas targeted by this assay, and inadequate number of viral copies  (<250 copies / mL). A negative result must be combined with clinical  observations, patient history, and epidemiological information. If result is POSITIVE SARS-CoV-2 target nucleic acids are DETECTED. The SARS-CoV-2 RNA is generally detectable in upper and lower  respiratory specimens dur ing the acute phase of infection.  Positive  results are indicative of active infection with SARS-CoV-2.  Clinical  correlation with patient history and other diagnostic information is  necessary to determine patient infection status.  Positive results do  not rule out bacterial infection or co-infection with other viruses. If result is PRESUMPTIVE POSTIVE SARS-CoV-2 nucleic acids MAY BE PRESENT.   A presumptive positive result was obtained on the submitted specimen  and confirmed on repeat testing.  While 2019 novel coronavirus  (SARS-CoV-2) nucleic acids may be present in the submitted sample  additional  confirmatory testing may be necessary for epidemiological  and / or clinical management purposes  to differentiate between  SARS-CoV-2 and other Sarbecovirus currently known to infect humans.  If clinically indicated additional testing with an alternate test  methodology [phone]) is advised. The SARS-CoV-2 RNA is generally  detectable in upper and  lower respiratory sp ecimens during the acute  phase of infection. The expected result is Negative. Fact Sheet for Patients:  StrictlyIdeas.no Fact Sheet for Healthcare Providers: BankingDealers.co.za This test is not yet approved or cleared by the [place] FDA and has been authorized for detection and/or diagnosis of SARS-CoV-2 by FDA under an Emergency Use Authorization (EUA).  This EUA will remain in effect (meaning this test can be used) for the duration of the COVID-19 declaration under Section 564(b)(1) of the Act, 21 U.S.C. section 360bbb-3(b)(1), unless the authorization is terminated or revoked sooner. Performed at [facility], [place] [address], [place], [place] [address]      Radiological Exams on Admission: Ct Renal Stone Study  Result Date: 08/06/2018 CLINICAL DATA:  Hematuria with unknown cause EXAM: CT ABDOMEN AND PELVIS WITHOUT CONTRAST TECHNIQUE: Multidetector CT imaging of the abdomen and pelvis was performed following the standard protocol without IV contrast. COMPARISON:  None. FINDINGS: Lower chest: Atelectasis or scarring in the lower lungs that is mild. Hepatobiliary: No focal liver abnormality.No evidence of biliary obstruction or stone. Pancreas: Generalized atrophy Spleen: Generous splenic size, stable from 2017 chest CT. Adrenals/Urinary Tract: Negative adrenals. Right nephrectomy. Prominent left hydroureteronephrosis to the level of the urinary bladder which is circumferentially thick walled. There is dependent soft tissue density with fluid fluid  level in the bladder. Punctate left renal calculus. No obstructing stone. Unremarkable bladder. Stomach/Bowel: No obstruction. No evidence of acute bowel inflammation. No gross mass in this patient with history of remote anal cancer. Sigmoid diverticulosis. Vascular/Lymphatic: Diffuse atherosclerotic calcification. No mass or adenopathy. Reproductive:Hysterectomy. Other: Stranding in the pelvis attributed to scarring from prior radiotherapy. No ascites or pneumoperitoneum Musculoskeletal: Spinal degeneration and bilateral hip osteoarthritis. Remote bilateral eleventh rib fractures. Remote right twelfth rib fracture. IMPRESSION: 1. Hydroureteronephrosis above the thick walled bladder which contains debris. There is history of UTI; the patient has also had prior pelvic radiotherapy. The left kidney is solitary. 2. Punctate left renal calculus. Electronically Signed   By: Monte Fantasia M.D.   On: 08/06/2018 04:29     Assessment/Plan Principal Problem:   Acute encephalopathy Active Problems:   HTN (hypertension)   Dementia (HCC)   UTI (urinary tract infection)   Pyelonephritis   ARF (acute renal failure) (HCC)   Hydronephrosis    1. Acute encephalopathy and weakness and lethargy likely could be from poor oral intake and UTI.  Will gently hydrate and recheck metabolic panel.  Follow urine cultures.  CT head is pending.  Given that patient also has mildly elevated LFTs will check ammonia levels.  Physical therapy consult. 2. Left-sided hydroureteronephrosis with solitary kidney will discuss with urologist.  Patient is presently afebrile does not have any signs of sepsis. 3. Elevated LFTs -cause not clear.  Follow LFTs will check acute hepatitis panel and Tylenol levels.  Follow ammonia levels. 4. Acute on chronic renal disease stage III will hold ARB and diuretics and keep patient on PRN IV hydralazine gently hydrate.  Patient also has hydroureteronephrosis for which we will get urology opinion. 5.  Dementia on Aricept and Namenda. 6. Chronic anemia check anemia panel given the patient's weakness and increasing confusion.  Addendum -discussed with Dr. Tresa Moore urologist about the CAT scan finding will be seeing patient in consult.   DVT prophylaxis: SCDs until we get opinion from urology and CT head. Code Status: DNR. Family Communication: Discussed with patient's power of attorney Ms. Tish Men who confirmed history and CODE STATUS. Disposition Plan: To be  determined. Consults called: Physical therapy. Admission status: Observation.   Rise Patience MD [facility] Pager [phone].  If 7PM-7AM, please contact night-coverage [email or web address] Password [email or web address]  08/06/2018, 6:20 AM

## 2018-08-06 NOTE — Progress Notes (Signed)
Initial Nutrition Assessment  INTERVENTION:   -Provide Ensure Enlive po BID, each supplement provides 350 kcal and 20 grams of protein -Provide Magic cup TID with meals, each supplement provides 290 kcal and 9 grams of protein  NUTRITION DIAGNOSIS:   Inadequate oral intake related to lethargy/confusion as evidenced by per patient/family report.  GOAL:   Patient will meet greater than or equal to 90% of their needs  MONITOR:   PO intake, Supplement acceptance, Labs, Weight trends, I & O's, Skin  REASON FOR ASSESSMENT:   Malnutrition Screening Tool    ASSESSMENT:   85 y.o. female with history of hypertension, dementia, and anal cancer was recently admitted last month for confusion and at that time patient was found to be having hypercalcemia renal failure and UTI was subsequently discharged home was found by family that the last few days patient has become increasingly lethargic poor p.o. intake and decreased ambulation. Patient admitted for further management of weakness encephalopathy and acute renal failure with UTI.  **RD working remotely**  Patient with dementia and AMS, not able to provide history at this time. Pt recently admitted in April 2020, at that time RD was unable to full assess given no weight was measured and pt had AMS then. Family reported in H&P, pt had decreased PO intake d/t lethargy. Will order Ensure supplements and Magic cups to accompany house trays. Will monitor PO intakes.  Per weight records, last weight recorded PTA was in 2018.   Medications: LOVAZA capsule daily, Vitamin C tablet daily, Vitamin E capsule TID Labs reviewed: GFR: 25   NUTRITION - FOCUSED PHYSICAL EXAM:  Unable to perform per department requirements to work remotely.  Diet Order:   Diet Order            Diet Heart Room service appropriate? Yes; Fluid consistency: Thin  Diet effective now              EDUCATION NEEDS:   Not appropriate for education at this time  Skin:   Skin Assessment: Skin Integrity Issues: Skin Integrity Issues:: Other (Comment) Other: MASD: buttocks, perineum  Last BM:  PTA  Height:   Ht Readings from Last 1 Encounters:  08/06/18 5\' 4"  (1.626 m)    Weight:   Wt Readings from Last 1 Encounters:  08/06/18 59 kg    Ideal Body Weight:  54.5 kg  BMI:  Body mass index is 22.31 kg/m.  Estimated Nutritional Needs:   Kcal:  2035-5974  Protein:  65-75g  Fluid:  1.6L/day  Clayton Bibles, MS, RD, LDN [place] Diet[facility] Pager: [phone] After Hours Pager: [phone]

## 2018-08-06 NOTE — Evaluation (Signed)
Physical Therapy Evaluation Patient Details Name: Shannon Blair MRN: [id number] DOB: 08/10/1931 Today's Date: 08/06/2018   History of Present Illness  Shannon Blair is a 85 y.o. female with history of hypertension, dementia, and anal cancer was recently admitted last month for confusion and at that time patient was found to be having hypercalcemia renal failure and UTI was subsequently discharged home was found by family that the last few days patient has become increasingly lethargic poor p.o. intake and decreased ambulation.  Patient was finding it to ambulate because patient found weak to walk and her legs were giving way.  Patient also complained of low back pain.  Has not had any chest pain nausea vomiting or diarrhea.  Most of the history was obtained from the patient's healthcare power of attorney who discussed with the ER physician.    Clinical Impression  Shannon Blair is a 85 y.o. presenting for PT evaluation with recent decrease in functional mobility secondary to increased weakness and confusion. At baseline Shannon Blair has history of dementia and is a poor historian, no family present to confirm patient's PLOF. She is moderately confused and has difficulty maintaining attention on task during evaluation requiring redirection. Subjective taken from pt report and prior admission. She requires min assist for bed mobility with cuing for use of bed rails and min assist with RW to stand from EOB. Pt is unsteady in standing has has flexed trunk posture with slight posterior trunk lean. She was incontinent in standing and unaware. She required verbal cues to attempt stepping at side up bed with RW and was unsafe with picking up walker and placing it farther away from herself each time and did not take any advancing steps. Attempted to redirect and take forward steps but patient unable to focus and sat back on bed. 2 more attempts at standing gait at EOB, pt unable to sequence lateral steps or  take more than 1 forward step. Deferred gait at this time due to safety concerns. She will benefit from skilled PT to address current impairments and from follow up PT at below venue to address mobility and medical management. Acute PT will follow.     Follow Up Recommendations SNF          Precautions / Restrictions Precautions Precautions: Fall Restrictions Weight Bearing Restrictions: No      Mobility  Bed Mobility Overal bed mobility: Needs Assistance Bed Mobility: Supine to Sit;Sit to Supine     Supine to sit: Min assist Sit to supine: Min assist   General bed mobility comments: pt required cues for use of bed rail and assistance to push up from Lt sidelying  Transfers Overall transfer level: Needs assistance Equipment used: Rolling walker (2 wheeled) Transfers: Sit to/from Stand Sit to Stand: Min assist      General transfer comment: Patient required assistance to initiate, cues needed for safe hand placement on bed and RW.  Ambulation/Gait    General Gait Details: attempted lateral stepping. Deferred at this time, pt incontinent and unsafe with RW.      Balance Overall balance assessment: Needs assistance Sitting-balance support: No upper extremity supported;Feet supported Sitting balance-Leahy Scale: Fair     Standing balance support: Bilateral upper extremity supported Standing balance-Leahy Scale: Poor Standing balance comment: pt requires external support for balance, slight anterior lean posturally requiring cues to straighten upright to maintain standing.      High Level Balance Comments: Marching: pt requires external support for pre-gait stepping. She requried  cues for safe use of RW throughout standing activities as pt has tendency to move RW around and away from herself.          Pertinent Vitals/Pain Pain Assessment: No/denies pain    Home Living Family/patient expects to be discharged to:: Private residence Living Arrangements:  Alone Available Help at Discharge: Other (Comment)(pt unable to recall) Type of Home: House Home Access: Stairs to enter   Entrance Stairs-Number of Steps: 1 Home Layout: Able to live on main level with bedroom/bathroom;One level Home Equipment: [place] - 2 wheels;Cane - single point Additional Comments: above per previous admission, pt reports she lives alone and states she has 1 step to get in home and 1 level house. She reports she has a dog at home with her "Tanner".     Prior Function Level of Independence: Independent                  Extremity/Trunk Assessment   Upper Extremity Assessment Upper Extremity Assessment: Generalized weakness    Lower Extremity Assessment Lower Extremity Assessment: Generalized weakness    Cervical / Trunk Assessment Cervical / Trunk Assessment: Normal  Communication   Communication: Other (comment);No difficulties(pt unclear historian, history of dimentia and AMS at admission)  Cognition Arousal/Alertness: Awake/alert Behavior During Therapy: WFL for tasks assessed/performed Overall Cognitive Status: Impaired/Different from baseline Area of Impairment: Safety/judgement;Attention;Memory;Awareness          General Comments: Pt aware of person, place when asked at start of session. Throughout session she asks for "Tanner" to come here, her dog. Re-oriented patient to place and time.              Assessment/Plan    PT Assessment Patient needs continued PT services  PT Problem List Decreased strength;Decreased activity tolerance;Decreased balance;Decreased mobility;Decreased cognition;Decreased safety awareness;Decreased knowledge of use of DME       PT Treatment Interventions DME instruction;Therapeutic exercise;Gait training;Balance training;Stair training;Functional mobility training;Therapeutic activities;Patient/family education    PT Goals (Current goals can be found in the Care Plan section)  Acute Rehab PT Goals Patient  Stated Goal: discussed goal to improve strength be able to walk with assistive device (pt did not verbalize goal of her own) PT Goal Formulation: With patient Time For Goal Achievement: 08/13/18 Potential to Achieve Goals: Fair    Frequency Min 3X/week   Barriers to discharge Decreased caregiver support pt lives alone    Co-evaluation               AM-PAC PT "6 Clicks" Mobility  Outcome Measure Help needed turning from your back to your side while in a flat bed without using bedrails?: A Little Help needed moving from lying on your back to sitting on the side of a flat bed without using bedrails?: A Little Help needed moving to and from a bed to a chair (including a wheelchair)?: A Lot Help needed standing up from a chair using your arms (e.g., wheelchair or bedside chair)?: A Little Help needed to walk in hospital room?: A Lot Help needed climbing 3-5 steps with a railing? : A Lot 6 Click Score: 15    End of Session Equipment Utilized During Treatment: Gait belt Activity Tolerance: Patient tolerated treatment well Patient left: in bed;with call bell/phone within reach;with bed alarm set Nurse Communication: Mobility status(educated on concerns for safety ) PT Visit Diagnosis: Unsteadiness on feet (R26.81);Other abnormalities of gait and mobility (R26.89);Difficulty in walking, not elsewhere classified (R26.2)    Time: 9604-5409 PT Time Calculation (min) (  ACUTE ONLY): 25 min   Charges:   PT Evaluation $PT Eval Moderate Complexity: 1 Mod          Kipp Brood, PT, DPT, [facility] Physical Therapist with [facility]  08/06/2018 1:37 PM

## 2018-08-06 NOTE — NC FL2 (Signed)
[place] LEVEL OF CARE SCREENING TOOL     IDENTIFICATION  Patient Name: Shannon Blair Birthdate: Sep 29, 1931 Sex: female Admission Date (Current Location): 08/05/2018  [facility] and [place] Number:  Herbalist and Address:  [facility],  [place], [place]      Provider Number: [id number]  Attending Physician Name and Address:  Oswald Hillock, MD  Relative Name and Phone Number:  [place]    Current Level of Care: Hospital Recommended Level of Care: [place] Prior Approval Number:    Date Approved/Denied:   PASRR Number: [id number]  Discharge Plan: SNF    Current Diagnoses: Patient Active Problem List   Diagnosis Date Noted  . Acute encephalopathy 08/06/2018  . Pyelonephritis 08/06/2018  . ARF (acute renal failure) ([place]) 08/06/2018  . Hydronephrosis 08/06/2018  . UTI (urinary tract infection) 07/06/2018  . Dementia ([place])   . Spinal stenosis, lumbar region with neurogenic claudication 07/26/2016  . Pseudoaphakia 11/13/2011  . Nonspecific (abnormal) findings on radiological and other examination of gastrointestinal tract 06/26/2011  . Colitis 06/25/2011  . Rectal bleeding 06/25/2011  . History of rectal or anal cancer 06/25/2011  . HTN (hypertension) 06/25/2011    Orientation RESPIRATION BLADDER Height & Weight     Self, Place  Normal Incontinent Weight: 130 lb (59 kg) Height:  5\' 4"  (162.6 cm)  BEHAVIORAL SYMPTOMS/MOOD NEUROLOGICAL BOWEL NUTRITION STATUS      Continent Diet(heart healthy)  AMBULATORY STATUS COMMUNICATION OF NEEDS Skin   Extensive Assist Verbally Normal                       Personal Care Assistance Level of Assistance  Bathing, Feeding, Dressing Bathing Assistance: Limited assistance(needs prompting and supervision) Feeding assistance: Limited assistance(prompting) Dressing Assistance: Maximum assistance     Functional Limitations Info  Sight,  Hearing, Speech Sight Info: Adequate Hearing Info: Adequate Speech Info: Adequate    SPECIAL CARE FACTORS FREQUENCY  PT (By licensed PT), OT (By licensed OT)     PT Frequency: 5x OT Frequency: 5x            Contractures Contractures Info: Not present    Additional Factors Info  Code Status, Allergies Code Status Info: DNR Allergies Info: nka           Current Medications (08/06/2018):  This is the current hospital active medication list Current Facility-Administered Medications  Medication Dose Route Frequency Provider Last Rate Last Dose  . 0.9 %  sodium chloride infusion   Intravenous Continuous Rise Patience, MD 75 mL/hr at 08/06/18 [id number]    . aspirin EC tablet 81 mg  81 mg Oral Daily Rise Patience, MD   81 mg at 08/06/18 0956  . cefTRIAXone (ROCEPHIN) 1 g in sodium chloride 0.9 % 100 mL IVPB  1 g Intravenous Q24H Rise Patience, MD      . donepezil (ARICEPT) tablet 10 mg  10 mg Oral QHS Rise Patience, MD      . feeding supplement (ENSURE ENLIVE) (ENSURE ENLIVE) liquid 237 mL  237 mL Oral BID BM Darrick Meigs, Marge Duncans, MD      . hydrALAZINE (APRESOLINE) injection 10 mg  10 mg Intravenous Q4H PRN Rise Patience, MD      . memantine [facility]) tablet 10 mg  10 mg Oral BID Rise Patience, MD   10 mg at 08/06/18 0956  . omega-3 acid ethyl esters (LOVAZA) capsule  1 g  1 capsule Oral Daily Rise Patience, MD   1 g at 08/06/18 0956  . ondansetron (ZOFRAN) tablet 4 mg  4 mg Oral Q6H PRN Rise Patience, MD       Or  . ondansetron [facility]) injection 4 mg  4 mg Intravenous Q6H PRN Rise Patience, MD      . oxybutynin (DITROPAN) tablet 5 mg  5 mg Oral BID Rise Patience, MD   5 mg at 08/06/18 0956  . venlafaxine XR (EFFEXOR-XR) 24 hr capsule 75 mg  75 mg Oral Q breakfast Rise Patience, MD   75 mg at 08/06/18 0747  . vitamin C (ASCORBIC ACID) tablet 500 mg  500 mg Oral Daily Rise Patience, MD   500 mg at 08/06/18 0956   . vitamin E capsule 200 Units  200 Units Oral TID Rise Patience, MD   200 Units at 08/06/18 3094     Discharge Medications: Please see discharge summary for a list of discharge medications.  Relevant Imaging Results:  Relevant Lab Results:   Additional Information SS#[id number]  Nila Nephew, LCSW

## 2018-08-06 NOTE — Progress Notes (Signed)
Subjective: Patient admitted this morning, see detailed H&P by Dr. Hal Hope 85 year old female with history of hypertension, dementia, anal cancer who was recently omitted for confusion at that time she was found to have hypercalcemia, renal failure, UTI and was discharged home.  As per family she has become increasingly lethargic, poor p.o. intake and decreased ambulation.  In the ED CT stone protocol showed left hydro-nephrosis, patient has solitary kidney.  CT head was unremarkable. This morning patient continues to be pleasantly confused. Urology has been consulted.  Vitals:   08/06/18 1324 08/06/18 1400  BP: (!) 133/57   Pulse: 85   Resp: (!) 22   Temp: 98.2 F (36.8 C)   SpO2: 97% 95%      A/P Encephalopathy Left-sided hydroureteronephrosis Transaminitis Acute on chronic kidney disease stage III Dementia  Continue ceftriaxone Follow urine cultures Urology consultation Continue [place] Hospitalist Pager- [phone]

## 2018-08-06 NOTE — ED Notes (Signed)
Pt found with soiled brief, pt provided with peri care and a clean brief.

## 2018-08-07 DIAGNOSIS — I159 Secondary hypertension, unspecified: Secondary | ICD-10-CM | POA: Diagnosis not present

## 2018-08-07 DIAGNOSIS — N1330 Unspecified hydronephrosis: Secondary | ICD-10-CM

## 2018-08-07 DIAGNOSIS — Z8249 Family history of ischemic heart disease and other diseases of the circulatory system: Secondary | ICD-10-CM | POA: Diagnosis not present

## 2018-08-07 DIAGNOSIS — I700 Atherosclerosis of aorta: Secondary | ICD-10-CM | POA: Diagnosis present

## 2018-08-07 DIAGNOSIS — G9340 Encephalopathy, unspecified: Secondary | ICD-10-CM | POA: Diagnosis not present

## 2018-08-07 DIAGNOSIS — Z800 Family history of malignant neoplasm of digestive organs: Secondary | ICD-10-CM | POA: Diagnosis not present

## 2018-08-07 DIAGNOSIS — N179 Acute kidney failure, unspecified: Secondary | ICD-10-CM | POA: Diagnosis present

## 2018-08-07 DIAGNOSIS — I129 Hypertensive chronic kidney disease with stage 1 through stage 4 chronic kidney disease, or unspecified chronic kidney disease: Secondary | ICD-10-CM | POA: Diagnosis present

## 2018-08-07 DIAGNOSIS — M5136 Other intervertebral disc degeneration, lumbar region: Secondary | ICD-10-CM | POA: Diagnosis present

## 2018-08-07 DIAGNOSIS — N12 Tubulo-interstitial nephritis, not specified as acute or chronic: Secondary | ICD-10-CM

## 2018-08-07 DIAGNOSIS — D631 Anemia in chronic kidney disease: Secondary | ICD-10-CM | POA: Diagnosis present

## 2018-08-07 DIAGNOSIS — G9349 Other encephalopathy: Secondary | ICD-10-CM | POA: Diagnosis present

## 2018-08-07 DIAGNOSIS — E860 Dehydration: Secondary | ICD-10-CM | POA: Diagnosis present

## 2018-08-07 DIAGNOSIS — R945 Abnormal results of liver function studies: Secondary | ICD-10-CM | POA: Diagnosis present

## 2018-08-07 DIAGNOSIS — E872 Acidosis: Secondary | ICD-10-CM | POA: Diagnosis present

## 2018-08-07 DIAGNOSIS — K8020 Calculus of gallbladder without cholecystitis without obstruction: Secondary | ICD-10-CM | POA: Diagnosis present

## 2018-08-07 DIAGNOSIS — Z85048 Personal history of other malignant neoplasm of rectum, rectosigmoid junction, and anus: Secondary | ICD-10-CM | POA: Diagnosis not present

## 2018-08-07 DIAGNOSIS — Z87440 Personal history of urinary (tract) infections: Secondary | ICD-10-CM | POA: Diagnosis not present

## 2018-08-07 DIAGNOSIS — N3000 Acute cystitis without hematuria: Secondary | ICD-10-CM

## 2018-08-07 DIAGNOSIS — R531 Weakness: Secondary | ICD-10-CM | POA: Diagnosis present

## 2018-08-07 DIAGNOSIS — Z7982 Long term (current) use of aspirin: Secondary | ICD-10-CM | POA: Diagnosis not present

## 2018-08-07 DIAGNOSIS — Z90710 Acquired absence of both cervix and uterus: Secondary | ICD-10-CM | POA: Diagnosis not present

## 2018-08-07 DIAGNOSIS — Z1159 Encounter for screening for other viral diseases: Secondary | ICD-10-CM | POA: Diagnosis not present

## 2018-08-07 DIAGNOSIS — F0390 Unspecified dementia without behavioral disturbance: Secondary | ICD-10-CM

## 2018-08-07 DIAGNOSIS — N183 Chronic kidney disease, stage 3 (moderate): Secondary | ICD-10-CM | POA: Diagnosis present

## 2018-08-07 DIAGNOSIS — M1990 Unspecified osteoarthritis, unspecified site: Secondary | ICD-10-CM | POA: Diagnosis present

## 2018-08-07 DIAGNOSIS — Z66 Do not resuscitate: Secondary | ICD-10-CM | POA: Diagnosis present

## 2018-08-07 DIAGNOSIS — Z905 Acquired absence of kidney: Secondary | ICD-10-CM | POA: Diagnosis not present

## 2018-08-07 DIAGNOSIS — N136 Pyonephrosis: Secondary | ICD-10-CM | POA: Diagnosis present

## 2018-08-07 DIAGNOSIS — Z923 Personal history of irradiation: Secondary | ICD-10-CM | POA: Diagnosis not present

## 2018-08-07 LAB — CBC
HCT: 26.7 % — ABNORMAL LOW (ref 36.0–46.0)
Hemoglobin: 8.6 g/dL — ABNORMAL LOW (ref 12.0–15.0)
MCH: 29.5 pg (ref 26.0–34.0)
MCHC: 32.2 g/dL (ref 30.0–36.0)
MCV: 91.4 fL (ref 80.0–100.0)
Platelets: 172 10*3/uL (ref 150–400)
RBC: 2.92 MIL/uL — ABNORMAL LOW (ref 3.87–5.11)
RDW: 13.1 % (ref 11.5–15.5)
WBC: 10.6 10*3/uL — ABNORMAL HIGH (ref 4.0–10.5)
nRBC: 0 % (ref 0.0–0.2)

## 2018-08-07 LAB — COMPREHENSIVE METABOLIC PANEL
ALT: 47 U/L — ABNORMAL HIGH (ref 0–44)
AST: 18 U/L (ref 15–41)
Albumin: 2.4 g/dL — ABNORMAL LOW (ref 3.5–5.0)
Alkaline Phosphatase: 197 U/L — ABNORMAL HIGH (ref 38–126)
Anion gap: 8 (ref 5–15)
BUN: 36 mg/dL — ABNORMAL HIGH (ref 8–23)
CO2: 20 mmol/L — ABNORMAL LOW (ref 22–32)
Calcium: 8.6 mg/dL — ABNORMAL LOW (ref 8.9–10.3)
Chloride: 111 mmol/L (ref 98–111)
Creatinine, Ser: 1.67 mg/dL — ABNORMAL HIGH (ref 0.44–1.00)
GFR calc Af Amer: 32 mL/min — ABNORMAL LOW (ref >60)
GFR calc non Af Amer: 28 mL/min — ABNORMAL LOW (ref >60)
Glucose, Bld: 87 mg/dL (ref 70–99)
Potassium: 4.2 mmol/L (ref 3.5–5.1)
Sodium: 139 mmol/L (ref 135–145)
Total Bilirubin: 0.4 mg/dL (ref 0.3–1.2)
Total Protein: 5.3 g/dL — ABNORMAL LOW (ref 6.5–8.1)

## 2018-08-07 LAB — HEPATITIS PANEL, ACUTE
HCV Ab: 0.3 s/co ratio (ref 0.0–0.9)
Hep A IgM: NEGATIVE
Hep B C IgM: NEGATIVE
Hepatitis B Surface Ag: NEGATIVE

## 2018-08-07 MED ORDER — HEPARIN SODIUM (PORCINE) 5000 UNIT/ML IJ SOLN
5000.0000 [IU] | Freq: Three times a day (TID) | INTRAMUSCULAR | Status: DC
  Administered 2018-08-07 – 2018-08-09 (×6): 5000 [IU] via SUBCUTANEOUS
  Filled 2018-08-07 (×6): qty 1

## 2018-08-07 MED ORDER — ACETAMINOPHEN 325 MG PO TABS
650.0000 mg | ORAL_TABLET | Freq: Four times a day (QID) | ORAL | Status: DC | PRN
  Administered 2018-08-07: 650 mg via ORAL
  Filled 2018-08-07: qty 2

## 2018-08-07 MED ORDER — SODIUM CHLORIDE 0.9 % IV SOLN
INTRAVENOUS | Status: AC
  Administered 2018-08-07: 11:00:00 via INTRAVENOUS

## 2018-08-07 NOTE — Progress Notes (Signed)
Pt stable overall with no needs. No changes to note overall. Pt continues to be weak with limited mobility. Pt denies pain. No signs of distress. rn will continue to monitor.

## 2018-08-07 NOTE — Progress Notes (Signed)
PROGRESS NOTE    Shannon Blair  WKM:[id number] DOB: [birth year] DOA: 08/05/2018 PCP: Merrilee Seashore, MD   Brief Narrative:  HPI per Dr. Gean Birchwood on 08/06/2018 Shannon Blair is a 85 y.o. female with history of hypertension, dementia, and anal cancer was recently admitted last month for confusion and at that time patient was found to be having hypercalcemia renal failure and UTI was subsequently discharged home was found by family that the last few days patient has become increasingly lethargic poor p.o. intake and decreased ambulation.  Patient was finding it to ambulate because patient found weak to walk and her legs were giving way.  Patient also complained of low back pain.  Has not had any chest pain nausea vomiting or diarrhea.  Most of the history was obtained from the patient's healthcare power of attorney who discussed with the ER physician.  ED Course: In the ER patient is oriented to her name follows commands moves all extremities labs show creatinine of 2.07 which is increased from 1.2 when patient was discharged last month.  AST is 51 ALT 81 anion gap is 11.  WBC count is 13.3 hemoglobin 10.6 platelets 214.  Urine shows more than 50 WBC large leukocyte esterase nitrite is positive bacteria rare RBC 11-20.  Given the patient has been recurrent UTI with renal failure CT renal study was done which shows left kidney with hydroureteronephrosis with thick-walled bladder and debris's.  Prior radiation changes seen.  Patient was started on fluids 1 L normal saline as a bolus and ceftriaxone started after urine cultures obtained.  Patient admitted for further management of weakness encephalopathy and acute renal failure with UTI.  **Interim History Still remains weak.  PT OT recommending SNF.  We will continue IV fluid hydration and IV antibiotics.  Assessment & Plan:   Principal Problem:   Acute encephalopathy Active Problems:   HTN (hypertension)   Dementia (HCC)   UTI  (urinary tract infection)   Pyelonephritis   ARF (acute renal failure) (HCC)   Hydronephrosis   Acute Infectious encephalopathy worsened in the setting of urinary tract infection on concomitant longstanding dementia -Discussed with patient's legal guardian and feels that her urinary tract infection is significantly altered patient's mental status.  Normally patient is a poor historian however acutely she became more confused -We will change to inpatient status given continued confusion -I discussed with the patient's legal guardian and normally she is ambulatory baseline with a rolling walker.  PT evaluation done and she was moderately confused and had difficulty maintaining attention on task evaluation requiring significant redirection. -Patient has poor p.o. intake at home and has not been eating so we will continue IV fluid hydration with normal saline at 75 mL's per hour -Encephalopathy is improving but will continue monitor -SARS-CoV-2 is negative -Head CT w/o Contrast showed "No acute finding or change from prior. Chronic small vessel ischemia with moderate atrophy." -Delirium precautions -Continue to treat infection and continue with IV fluid hydration  Acute Urinary tract Infection -Urinalysis showed cloudy appearance with small hemoglobin, large leukocytes, positive nitrites, rare bacteria, 11-20 red blood cells per high-power field, greater than 50 WBCs present, urine culture was never done so we will order one now -Blood cultures were never done as well -Continue with empiric antibiotics with IV ceftriaxone for now -Follow Cx's   Generalized weakness in the setting of infection along with poor p.o. intake -Continue gentle IV fluid hydration with normal saline at a rate of 75 mL's per hour -  Repeat CMP in a.m. -PT OT to evaluate and treat  Left-Sided hydroureteronephrosis and a history of a patient with a solitary kidney -Currently afebrile does not have any signs of  sepsis -Urology was consulted and feels that the left hydronephrosis is likely secondary to cystitis and is transient versus a distal stricture from prior radiation -C/w Oxybutynin 5 mg po BID  -Urology feels that she is a poor operative candidate given her functional capacity and states that the GFR feels improve we will consider a nuclear medicine renogram versus palliative transition  Chronic Dementia -Continue with donepezil 10 mg p.o. nightly along with memantine 10 mg p.o. twice daily  Abnormal LFTs -Likely reactive and improving -Hepatitis panel is negative -AST is trended down now 18 (was 51 on admission).  ALT is now trended down as well and is 47 (81). -Repeat CMP in the a.m. and Continue monitor and trend LFTs  AKI on CKD -Improving.  Likely in the setting of poor p.o. intake and acute urinary tract infection -Baseline Cr around 0.8-1.2 -BUN/Cr went from 46/2.07 is now 36/1.67 -Continue with IV fluid hydration with normal saline -Continue to Monitor and Trend Renal Fxn -Repeat CMP in AM   Poor Oral Intake and Dehydration -Continue with normal saline at a rate of 75 mils per hour for 1 more day -Continue to Monitor  -Consult Nutritionist for further evaluation and recommendations -C/w Ensure Enlive 237 mL po BID   Non-Gap Metabolic Acidosis -Mild as CO2 was 20. AG was 8 -Continue IVF Hydration as above  -Repeat CMP in AM   Normocytic Anemia/Anemia of Chronic Kidney Disease -Patient's hemoglobin/hematocrit went from 10.6/32.8 is now 8.6/26.7 -Likely dilutional drop in the setting of IV fluid hydration -Anemia panel done showed an iron level of 12, U IBC 139, TIBC 151, saturation ratios of 8%, ferritin level 180, folate level 24.1, vitamin B12 of 2259  Back Pain -Check Lumbar X-Ray -Has a Hx of Microdiscetomy in 2018  Leukocytosis -Patient's WBC went from 13.3 -> 10.4 -> 10.6 -Continue to Treat Infection and monitor for any worsening -Continue to Monitor and  repeat CBC in AM   DVT prophylaxis: Heparin 5,000 units sq q8h Code Status: DO NOT RESUSCITATE Family Communication: Discussed with Legal Guardian via Telephone Disposition Plan: SNF when medically stable. Changed to inpatient status given continued IVF, IV Ceftriaxone, and Continued Weakness   Consultants:   None   Procedures: None   Antimicrobials:  Anti-infectives (From admission, onward)   Start     Dose/Rate Route Frequency Ordered Stop   08/06/18 2200  cefTRIAXone (ROCEPHIN) 1 g in sodium chloride 0.9 % 100 mL IVPB     1 g 200 mL/hr over 30 Minutes Intravenous Every 24 hours 08/06/18 0620     08/06/18 0245  cefTRIAXone (ROCEPHIN) 1 g in sodium chloride 0.9 % 100 mL IVPB     1 g 200 mL/hr over 30 Minutes Intravenous  Once 08/06/18 0235 08/06/18 0319     Subjective: Seen and examined at bedside and she was awake and not complaining of anything.  She was feeling with her facemask.  No chest pain, lightheadedness or dizziness.  No other concerns or complaints at this time.  No nursing events reported overnight  Objective: Vitals:   08/06/18 2117 08/07/18 0503 08/07/18 1031 08/07/18 1315  BP: 137/66 (!) 133/49 (!) 129/46 115/88  Pulse: 74 66 72 79  Resp: 20 15 16 19   Temp: 98.8 F (37.1 C) 98.7 F (37.1 C)  98 F (  36.7 C)  TempSrc: Oral Oral  Oral  SpO2: 97% 96% 97% 99%  Weight:      Height:        Intake/Output Summary (Last 24 hours) at 08/07/2018 1537 Last data filed at 08/07/2018 1319 Gross per 24 hour  Intake 1450.31 ml  Output 1100 ml  Net 350.31 ml   Filed Weights   08/06/18 0000  Weight: 59 kg   Examination: Physical Exam:  Constitutional: Elderly Caucasian female who is pleasantly demented in NAD and appears calm  Eyes: PERRL, lids and conjunctivae normal, sclerae anicteric  ENMT: External Ears, Nose appear normal. Grossly normal hearing.  Neck: Appears normal, supple, no cervical masses, normal ROM, no appreciable thyromegaly; no  JVD Respiratory: Diminished to auscultation bilaterally, no wheezing, rales, rhonchi or crackles.  Cardiovascular: RRR, no murmurs / rubs / gallops. S1 and S2 auscultated. No extremity edema on a limited skin evaluation. 2+ pedal pulses. No carotid bruits.  Abdomen: Soft, non-tender, non-distended. No masses palpated. No appreciable hepatosplenomegaly.  Musculoskeletal: No clubbing / cyanosis of digits/nails. No joint deformity upper and lower extremities.  Skin: No rashes, lesions, ulcers on a limited skin evaluation. No induration; Warm and dry.  Neurologic: CN 2-12 grossly intact with no focal deficits. Romberg sign cerebellar reflexes not assessed. Psychiatric: Impaired judgment and insight. Alert and awake. Normal mood and appropriate affect.   Data Reviewed: I have personally reviewed following labs and imaging studies  CBC: Recent Labs  Lab 08/06/18 0111 08/06/18 0743 08/07/18 0412  WBC 13.3* 10.4 10.6*  NEUTROABS 10.0* 8.0*  --   HGB 10.6* 8.9* 8.6*  HCT 32.8* 27.8* 26.7*  MCV 90.1 90.6 91.4  PLT 214 199 161   Basic Metabolic Panel: Recent Labs  Lab 08/06/18 0111 08/06/18 0743 08/07/18 0412  NA 135 136 139  K 4.2 4.1 4.2  CL 104 110 111  CO2 20* 20* 20*  GLUCOSE 113* 98 87  BUN 46* 42* 36*  CREATININE 2.07* 1.81* 1.67*  CALCIUM 9.4 8.6* 8.6*   GFR: Estimated Creatinine Clearance: 21.7 mL/min (A) (by C-G formula based on SCr of 1.67 mg/dL (H)). Liver Function Tests: Recent Labs  Lab 08/06/18 0111 08/06/18 0743 08/07/18 0412  AST 51* 44* 18  ALT 81* 67* 47*  ALKPHOS 253* 225* 197*  BILITOT 0.4 0.2* 0.4  PROT 6.5 5.7* 5.3*  ALBUMIN 3.0* 2.6* 2.4*   No results for input(s): LIPASE, AMYLASE in the last 168 hours. Recent Labs  Lab 08/06/18 0743  AMMONIA <9*   Coagulation Profile: No results for input(s): INR, PROTIME in the last 168 hours. Cardiac Enzymes: Recent Labs  Lab 08/06/18 0743  TROPONINI 0.03*   BNP (last 3 results) No results for  input(s): PROBNP in the last 8760 hours. HbA1C: No results for input(s): HGBA1C in the last 72 hours. CBG: No results for input(s): GLUCAP in the last 168 hours. Lipid Profile: No results for input(s): CHOL, HDL, LDLCALC, TRIG, CHOLHDL, LDLDIRECT in the last 72 hours. Thyroid Function Tests: Recent Labs    08/06/18 0743  TSH 1.031   Anemia Panel: Recent Labs    08/06/18 0743  VITAMINB12 2,259*  FOLATE 24.1  FERRITIN 180  TIBC 151*  IRON 12*  RETICCTPCT 0.9   Sepsis Labs: No results for input(s): PROCALCITON, LATICACIDVEN in the last 168 hours.  Recent Results (from the past 240 hour(s))  SARS Coronavirus 2 (CEPHEID - Performed in [facility] hospital lab), [facility] Order     Status: None   Collection  Time: 08/06/18  4:31 AM  Result Value Ref Range Status   SARS Coronavirus 2 NEGATIVE NEGATIVE Final    Comment: (NOTE) If result is NEGATIVE SARS-CoV-2 target nucleic acids are NOT DETECTED. The SARS-CoV-2 RNA is generally detectable in upper and lower  respiratory specimens during the acute phase of infection. The lowest  concentration of SARS-CoV-2 viral copies this assay can detect is 250  copies / mL. A negative result does not preclude SARS-CoV-2 infection  and should not be used as the sole basis for treatment or other  patient management decisions.  A negative result may occur with  improper specimen collection / handling, submission of specimen other  than nasopharyngeal swab, presence of viral mutation(s) within the  areas targeted by this assay, and inadequate number of viral copies  (<250 copies / mL). A negative result must be combined with clinical  observations, patient history, and epidemiological information. If result is POSITIVE SARS-CoV-2 target nucleic acids are DETECTED. The SARS-CoV-2 RNA is generally detectable in upper and lower  respiratory specimens dur ing the acute phase of infection.  Positive  results are indicative of active infection with  SARS-CoV-2.  Clinical  correlation with patient history and other diagnostic information is  necessary to determine patient infection status.  Positive results do  not rule out bacterial infection or co-infection with other viruses. If result is PRESUMPTIVE POSTIVE SARS-CoV-2 nucleic acids MAY BE PRESENT.   A presumptive positive result was obtained on the submitted specimen  and confirmed on repeat testing.  While 2019 novel coronavirus  (SARS-CoV-2) nucleic acids may be present in the submitted sample  additional confirmatory testing may be necessary for epidemiological  and / or clinical management purposes  to differentiate between  SARS-CoV-2 and other Sarbecovirus currently known to infect humans.  If clinically indicated additional testing with an alternate test  methodology [phone]) is advised. The SARS-CoV-2 RNA is generally  detectable in upper and lower respiratory sp ecimens during the acute  phase of infection. The expected result is Negative. Fact Sheet for Patients:  StrictlyIdeas.no Fact Sheet for Healthcare Providers: BankingDealers.co.za This test is not yet approved or cleared by the [place] FDA and has been authorized for detection and/or diagnosis of SARS-CoV-2 by FDA under an Emergency Use Authorization (EUA).  This EUA will remain in effect (meaning this test can be used) for the duration of the COVID-19 declaration under Section 564(b)(1) of the Act, 21 U.S.C. section 360bbb-3(b)(1), unless the authorization is terminated or revoked sooner. Performed at [facility], [place] [address] [place], [place] [address]     Radiology Studies: Ct Head Wo Contrast  Result Date: 08/06/2018 CLINICAL DATA:  Altered level of consciousness EXAM: CT HEAD WITHOUT CONTRAST TECHNIQUE: Contiguous axial images were obtained from the base of the skull through the vertex without intravenous contrast. COMPARISON:   07/05/2018 FINDINGS: Brain: No evidence of acute infarction, hemorrhage, hydrocephalus, extra-axial collection or mass lesion/mass effect. Confluent low-density in the cerebral white matter attributed to chronic small vessel ischemia. Cerebral volume loss with congruent ventriculomegaly. Vascular: Atherosclerotic calcification Skull: No acute or aggressive finding Sinuses/Orbits: Bilateral cataract resection. IMPRESSION: 1. No acute finding or change from prior. 2. Chronic small vessel ischemia with moderate atrophy. Electronically Signed   By: Monte Fantasia M.D.   On: 08/06/2018 09:37   Ct Renal Stone Study  Result Date: 08/06/2018 CLINICAL DATA:  Hematuria with unknown cause EXAM: CT ABDOMEN AND PELVIS WITHOUT CONTRAST TECHNIQUE: Multidetector CT imaging of the abdomen  and pelvis was performed following the standard protocol without IV contrast. COMPARISON:  None. FINDINGS: Lower chest: Atelectasis or scarring in the lower lungs that is mild. Hepatobiliary: No focal liver abnormality.No evidence of biliary obstruction or stone. Pancreas: Generalized atrophy Spleen: Generous splenic size, stable from 2017 chest CT. Adrenals/Urinary Tract: Negative adrenals. Right nephrectomy. Prominent left hydroureteronephrosis to the level of the urinary bladder which is circumferentially thick walled. There is dependent soft tissue density with fluid fluid level in the bladder. Punctate left renal calculus. No obstructing stone. Unremarkable bladder. Stomach/Bowel: No obstruction. No evidence of acute bowel inflammation. No gross mass in this patient with history of remote anal cancer. Sigmoid diverticulosis. Vascular/Lymphatic: Diffuse atherosclerotic calcification. No mass or adenopathy. Reproductive:Hysterectomy. Other: Stranding in the pelvis attributed to scarring from prior radiotherapy. No ascites or pneumoperitoneum Musculoskeletal: Spinal degeneration and bilateral hip osteoarthritis. Remote bilateral eleventh rib  fractures. Remote right twelfth rib fracture. IMPRESSION: 1. Hydroureteronephrosis above the thick walled bladder which contains debris. There is history of UTI; the patient has also had prior pelvic radiotherapy. The left kidney is solitary. 2. Punctate left renal calculus. Electronically Signed   By: [place] Fantasia M.D.   On: 08/06/2018 04:29   Scheduled Meds:  aspirin EC  81 mg Oral Daily   donepezil  10 mg Oral QHS   feeding supplement (ENSURE ENLIVE)  237 mL Oral BID BM   memantine  10 mg Oral BID   omega-3 acid ethyl esters  1 capsule Oral Daily   oxybutynin  5 mg Oral BID   venlafaxine XR  75 mg Oral Q breakfast   vitamin C  500 mg Oral Daily   vitamin E  200 Units Oral TID   Continuous Infusions:  sodium chloride 75 mL/hr at 08/07/18 1037   cefTRIAXone (ROCEPHIN)  IV Stopped (08/06/18 2159)    LOS: 0 days   Kerney Elbe, DO [facility] PAGER is on AMION  If 7PM-7AM, please contact night-coverage [email or web address] Password [facility] 08/07/2018, 3:37 PM

## 2018-08-08 ENCOUNTER — Inpatient Hospital Stay (HOSPITAL_COMMUNITY): Payer: Medicare HMO

## 2018-08-08 DIAGNOSIS — R338 Other retention of urine: Secondary | ICD-10-CM

## 2018-08-08 LAB — COMPREHENSIVE METABOLIC PANEL
ALT: 67 U/L — ABNORMAL HIGH (ref 0–44)
AST: 47 U/L — ABNORMAL HIGH (ref 15–41)
Albumin: 2.1 g/dL — ABNORMAL LOW (ref 3.5–5.0)
Alkaline Phosphatase: 263 U/L — ABNORMAL HIGH (ref 38–126)
Anion gap: 5 (ref 5–15)
BUN: 36 mg/dL — ABNORMAL HIGH (ref 8–23)
CO2: 21 mmol/L — ABNORMAL LOW (ref 22–32)
Calcium: 8.8 mg/dL — ABNORMAL LOW (ref 8.9–10.3)
Chloride: 113 mmol/L — ABNORMAL HIGH (ref 98–111)
Creatinine, Ser: 1.34 mg/dL — ABNORMAL HIGH (ref 0.44–1.00)
GFR calc Af Amer: 42 mL/min — ABNORMAL LOW (ref >60)
GFR calc non Af Amer: 36 mL/min — ABNORMAL LOW (ref >60)
Glucose, Bld: 99 mg/dL (ref 70–99)
Potassium: 4.5 mmol/L (ref 3.5–5.1)
Sodium: 139 mmol/L (ref 135–145)
Total Bilirubin: 0.3 mg/dL (ref 0.3–1.2)
Total Protein: 5.2 g/dL — ABNORMAL LOW (ref 6.5–8.1)

## 2018-08-08 LAB — CBC WITH DIFFERENTIAL/PLATELET
Abs Immature Granulocytes: 0.56 10*3/uL — ABNORMAL HIGH (ref 0.00–0.07)
Basophils Absolute: 0.1 10*3/uL (ref 0.0–0.1)
Basophils Relative: 1 %
Eosinophils Absolute: 0.3 10*3/uL (ref 0.0–0.5)
Eosinophils Relative: 3 %
HCT: 26.7 % — ABNORMAL LOW (ref 36.0–46.0)
Hemoglobin: 8.5 g/dL — ABNORMAL LOW (ref 12.0–15.0)
Immature Granulocytes: 6 %
Lymphocytes Relative: 13 %
Lymphs Abs: 1.1 10*3/uL (ref 0.7–4.0)
MCH: 29.4 pg (ref 26.0–34.0)
MCHC: 31.8 g/dL (ref 30.0–36.0)
MCV: 92.4 fL (ref 80.0–100.0)
Monocytes Absolute: 0.6 10*3/uL (ref 0.1–1.0)
Monocytes Relative: 6 %
Neutro Abs: 6.3 10*3/uL (ref 1.7–7.7)
Neutrophils Relative %: 71 %
Platelets: 174 10*3/uL (ref 150–400)
RBC: 2.89 MIL/uL — ABNORMAL LOW (ref 3.87–5.11)
RDW: 13.1 % (ref 11.5–15.5)
WBC: 8.8 10*3/uL (ref 4.0–10.5)
nRBC: 0 % (ref 0.0–0.2)

## 2018-08-08 LAB — MAGNESIUM: Magnesium: 1.8 mg/dL (ref 1.7–2.4)

## 2018-08-08 LAB — URINE CULTURE: Culture: <10000 — AB

## 2018-08-08 LAB — PHOSPHORUS: Phosphorus: 2.9 mg/dL (ref 2.5–4.6)

## 2018-08-08 MED ORDER — SODIUM CHLORIDE 0.9 % IV SOLN
INTRAVENOUS | Status: AC
  Administered 2018-08-08: 18:00:00 via INTRAVENOUS

## 2018-08-08 NOTE — Progress Notes (Signed)
Physical Therapy Treatment Patient Details Name: Shannon Blair MRN: [id number] DOB: March 09, 1932 Today's Date: 08/08/2018    History of Present Illness Shannon Blair is a 85 y.o. female with history of hypertension, dementia, and anal cancer was recently admitted last month for confusion and at that time patient was found to be having hypercalcemia renal failure and UTI was subsequently discharged home was found by family that the last few days patient has become increasingly lethargic poor p.o. intake and decreased ambulation.  Patient was finding it to ambulate because patient found weak to walk and her legs were giving way.  Patient also complained of low back pain.  Has not had any chest pain nausea vomiting or diarrhea.  Most of the history was obtained from the patient's healthcare power of attorney who discussed with the ER physician.    PT Comments    Pt with confusion this session, stating upon PT arrival that she was going home and needed to get dressed. PT reoriented her to situation, pt in disbelief that she was not d/cing today. Pt ambulated short room distance with use of RW this session, required min assist for steadying and safe navigation in room. Pt additionally performed LE exercises for strengthening, limited by fatigue. PT continuing to strongly recommend SNF post-d/c, will continue to follow acutely.     Follow Up Recommendations  SNF     Equipment Recommendations  Other (comment)(defer to next venue)    Recommendations for Other Services       Precautions / Restrictions Precautions Precautions: Fall Restrictions Weight Bearing Restrictions: No    Mobility  Bed Mobility Overal bed mobility: Needs Assistance Bed Mobility: Supine to Sit     Supine to sit: Min assist     General bed mobility comments: Min assist for LE management, trunk elevation. Pt with use of bedrails and increased time.   Transfers Overall transfer level: Needs  assistance Equipment used: Rolling walker (2 wheeled) Transfers: Sit to/from Stand Sit to Stand: Min assist         General transfer comment: Min assist for power up, steadying, VC for hand placement when rising. Sit to stand x2, once from bed and once from [facility] after toileting.   Ambulation/Gait Ambulation/Gait assistance: Min assist Gait Distance (Feet): 6 Feet Assistive device: Rolling walker (2 wheeled) Gait Pattern/deviations: Step-through pattern;Decreased stride length;Trunk flexed Gait velocity: decr    General Gait Details: Min assist for steadying, holding RW as pt with tendency to push RW far in front of her, steadying with turning, and eccentric lowering into recliner after ambulation   Stairs             Wheelchair Mobility    Modified Rankin (Stroke Patients Only)       Balance Overall balance assessment: Needs assistance Sitting-balance support: No upper extremity supported;Feet supported Sitting balance-Leahy Scale: Fair     Standing balance support: Bilateral upper extremity supported Standing balance-Leahy Scale: Poor Standing balance comment: pt requires external support for balance                            Cognition Arousal/Alertness: Awake/alert Behavior During Therapy: WFL for tasks assessed/performed Overall Cognitive Status: Impaired/Different from baseline Area of Impairment: Safety/judgement;Attention;Memory;Awareness;Orientation;Problem solving                 Orientation Level: Situation Current Attention Level: Sustained Memory: Decreased short-term memory   Safety/Judgement: Decreased awareness of deficits;Decreased awareness of safety  Problem Solving: Requires tactile cues;Requires verbal cues;Difficulty sequencing General Comments: Pt states she does not know why she is in the hospital, states that "nobody knows". Pt stating she was going home today, and was asking for assist to dress, leave, and "check  out".      Exercises General Exercises - Lower Extremity Long Arc Quad: AROM;Both;10 reps;Seated Straight Leg Raises: AROM;Both;5 reps;Seated Hip Flexion/Marching: AROM;Both;10 reps;Seated    General Comments        Pertinent Vitals/Pain Pain Assessment: No/denies pain    Home Living                      Prior Function            PT Goals (current goals can now be found in the care plan section) Acute Rehab PT Goals Patient Stated Goal: discussed goal to improve strength be able to walk with assistive device (pt did not verbalize goal of her own) PT Goal Formulation: With patient Time For Goal Achievement: 08/20/18 Potential to Achieve Goals: Fair Progress towards PT goals: Progressing toward goals    Frequency    Min 2X/week      PT Plan Frequency needs to be updated    Co-evaluation              AM-PAC PT "6 Clicks" Mobility   Outcome Measure  Help needed turning from your back to your side while in a flat bed without using bedrails?: A Little Help needed moving from lying on your back to sitting on the side of a flat bed without using bedrails?: A Little Help needed moving to and from a bed to a chair (including a wheelchair)?: A Lot Help needed standing up from a chair using your arms (e.g., wheelchair or bedside chair)?: A Little Help needed to walk in hospital room?: A Lot Help needed climbing 3-5 steps with a railing? : A Lot 6 Click Score: 15    End of Session Equipment Utilized During Treatment: Gait belt Activity Tolerance: Patient tolerated treatment well Patient left: with call bell/phone within reach;in chair;with chair alarm set Nurse Communication: Mobility status PT Visit Diagnosis: Unsteadiness on feet (R26.81);Other abnormalities of gait and mobility (R26.89);Difficulty in walking, not elsewhere classified (R26.2)     Time: 7628-3151 PT Time Calculation (min) (ACUTE ONLY): 22 min  Charges:  $Therapeutic Activity: 8-22  mins                    Stephaie Dardis Conception Chancy, PT Acute Rehabilitation Services Pager [phone]  Office [phone]   Roxine Caddy D Elonda Husky 08/08/2018, 4:25 PM

## 2018-08-08 NOTE — Progress Notes (Signed)
PROGRESS NOTE    Shannon Blair  YBO:[id number] DOB: 1932-03-02 DOA: 08/05/2018 PCP: Merrilee Seashore, MD   Brief Narrative:  HPI per Dr. Gean Birchwood on 08/06/2018 Shannon Blair is a 85 y.o. female with history of hypertension, dementia, and anal cancer was recently admitted last month for confusion and at that time patient was found to be having hypercalcemia renal failure and UTI was subsequently discharged home was found by family that the last few days patient has become increasingly lethargic poor p.o. intake and decreased ambulation.  Patient was finding it to ambulate because patient found weak to walk and her legs were giving way.  Patient also complained of low back pain.  Has not had any chest pain nausea vomiting or diarrhea.  Most of the history was obtained from the patient's healthcare power of attorney who discussed with the ER physician.  ED Course: In the ER patient is oriented to her name follows commands moves all extremities labs show creatinine of 2.07 which is increased from 1.2 when patient was discharged last month.  AST is 51 ALT 81 anion gap is 11.  WBC count is 13.3 hemoglobin 10.6 platelets 214.  Urine shows more than 50 WBC large leukocyte esterase nitrite is positive bacteria rare RBC 11-20.  Given the patient has been recurrent UTI with renal failure CT renal study was done which shows left kidney with hydroureteronephrosis with thick-walled bladder and debris's.  Prior radiation changes seen.  Patient was started on fluids 1 L normal saline as a bolus and ceftriaxone started after urine cultures obtained.  Patient admitted for further management of weakness encephalopathy and acute renal failure with UTI.  **Interim History Still remains weak.  PT OT recommending SNF and PoA to decide about which SNF.  We will continue IV fluid hydration but reduce rate and will continue IV antibiotics. Hospitalization has been complicated by Abnormal LFTs so RUQ U/S was  ordered.   Assessment & Plan:   Principal Problem:   Acute encephalopathy Active Problems:   HTN (hypertension)   Dementia (HCC)   UTI (urinary tract infection)   Pyelonephritis   ARF (acute renal failure) (HCC)   Hydronephrosis   Acute Infectious encephalopathy worsened in the setting of urinary tract infection on concomitant longstanding dementia, improving  -Discussed with patient's legal guardian and feels that her urinary tract infection is significantly altered patient's mental status.  Normally patient is a poor historian however acutely she became more confused but now is getting better  -Changed to inpatient status given continued confusion -I discussed with the patient's legal guardian and normally she is ambulatory baseline with a rolling walker.  PT evaluation done and she was moderately confused and had difficulty maintaining attention on task evaluation requiring significant redirection. PT Recommending SNF and Legal Sammuel Bailiff is agreeable and will pick SNF -Patient had poor p.o. intake at home and has not been eating so we will continue IV fluid hydration with normal saline but reduce rate from 75 mL's per hour to 50 mL/hr -Encephalopathy is improving but will continue monitor -SARS-CoV-2 is negative -Head CT w/o Contrast showed "No acute finding or change from prior. Chronic small vessel ischemia with moderate atrophy." -C/w Delirium precautions -Continue to treat infection and continue with IV fluid hydration with NS at 50 mL/hr x 1 day   Acute Urinary tract Infection -Urinalysis showed cloudy appearance with small hemoglobin, large leukocytes, positive nitrites, rare bacteria, 11-20 red blood cells per high-power field, greater than 50 WBCs present,  urine culture was never done so we will order one now -Urine Cx done after Abx had been hung; Urine Cx showed <10,000 CFU of Insignificant Growth  -Blood cultures were never done as well so Ordered today -Continue with  empiric antibiotics with IV ceftriaxone for now -Follow Cx's but they have little value now that she had been on Abx  Generalized weakness in the setting of infection along with poor p.o. intake -Continue gentle IV fluid hydration with normal saline at a rate of 50 mL/hr -Repeat CMP in a.m. -PT OT to evaluate and treat and recommending SNF -Nutritionisit on board for poor po intake recommending Ensure Enlive p.o. twice daily along with Magic cups 3 times daily with meals  Left-Sided hydroureteronephrosis and a history of a patient with a solitary kidney -Currently afebrile does not have any signs of sepsis -Urology was consulted and feels that the left hydronephrosis is likely secondary to cystitis and is transient versus a distal stricture from prior radiation -C/w Oxybutynin 5 mg po BID  -Will obtain a Renal U/S -Urology feels that she is a poor operative candidate given her functional capacity and states that if the GFR fails to improve we should consider a nuclear medicine renogram versus palliative transition  Chronic Dementia -Continue with Donepezil 10 mg p.o. nightly along with memantine 10 mg p.o. twice daily  Abnormal LFTs -Likely reactive and improving initially but acutely worsened again  -Hepatitis panel is negative -AST is trended down now 18 yesterday and now up to 47 (was 51 on admission).  ALT is now trended down as well and was 47 but now is 67 (81). -Checked RUQ U/S and this is pending to be done  -Repeat CMP in the a.m. and Continue monitor and trend LFTs  AKI on CKD -Improving.  Likely in the setting of poor p.o. intake and acute urinary tract infection -Baseline Cr around 0.8-1.2 -BUN/Cr went from 46/2.07 is now 36/1.34 -Continue with IV fluid hydration with normal saline but reduce rate to 50 mL/hr x1 day  -Continue to Monitor and Trend Renal Fxn -Repeat CMP in AM   Poor Oral Intake and Dehydration -Continued with normal saline at a rate of 75 mils per hour  but reduced rate to 50 mL/hr x1 day  -Continue to Monitor  -Consult Nutritionist for further evaluation and recommendations -C/w Ensure Enlive 237 mL po BID  -C/w Magic Cup TIDwm  Non-Gap Metabolic Acidosis -Mild as CO2 was 20. AG was 8; -Now CO2 is 21 and AG is 5; Chloride was slightly high today at 113 so will continue to monitor closely  -Continue IVF Hydration as above  -Repeat CMP in AM   Normocytic Anemia/Anemia of Chronic Kidney Disease -Patient's hemoglobin/hematocrit went from 10.6/32.8 is now 8.5/ -Likely dilutional drop in the setting of IV fluid hydration -Anemia panel done showed an iron level of 12, U IBC 139, TIBC 151, saturation ratios of 8%, ferritin level 180, folate level 24.1, vitamin B12 of 2259 -Continue to Monitor for S/Sx of Bleeding  -Repeat CBC in AM   Back Pain -Checked Lumbar X-Ray and showed "Diffuse degenerative disc and facet disease throughout the lumbar spine. No fracture. No malalignment. SI joints symmetric and unremarkable. Diffuse aortic atherosclerosis. No aneurysm." -Has a Hx of Microdiscetomy in 2018 -C/w PT/OT to evaluate and treat -Will need to follow up with her Orthopedic Surgeon at D/C    Leukocytosis -Patient's WBC went from 13.3 -> 10.4 -> 10.6 -> 8.8 -Continue to Treat Infection and  monitor for any worsening -Continue to Monitor and repeat CBC in AM   DVT prophylaxis: Heparin 5,000 units sq q8h Code Status: DO NOT RESUSCITATE Family Communication: No family present at bedside  Disposition Plan: SNF when medically stable  Consultants:   None   Procedures:  DG Lumbar X-Ray RUQ U/S ordered and pending to be done   Antimicrobials:  Anti-infectives (From admission, onward)   Start     Dose/Rate Route Frequency Ordered Stop   08/06/18 2200  cefTRIAXone (ROCEPHIN) 1 g in sodium chloride 0.9 % 100 mL IVPB     1 g 200 mL/hr over 30 Minutes Intravenous Every 24 hours 08/06/18 0620     08/06/18 0245  cefTRIAXone (ROCEPHIN) 1 g in  sodium chloride 0.9 % 100 mL IVPB     1 g 200 mL/hr over 30 Minutes Intravenous  Once 08/06/18 0235 08/06/18 0319     Subjective: Seen and examined Arida her going down for her lumbar x-ray she denied any chest pain, lightheadedness or dizziness.  Had no complaints at this time.  Had some erythema on her nose yesterday with swelling which is improved.  Nursing reports no overnight events and legal guardian to decide about skilled nursing facility.  Patient's renal function is improving.  Objective: Vitals:   08/07/18 1031 08/07/18 1315 08/07/18 2202 08/08/18 0539  BP: (!) 129/46 115/88 (!) 140/57 (!) 144/64  Pulse: 72 79 68 62  Resp: 16 19 18 18   Temp:  98 F (36.7 C) 98.6 F (37 C) 98.2 F (36.8 C)  TempSrc:  Oral Oral Oral  SpO2: 97% 99% 95% 97%  Weight:      Height:        Intake/Output Summary (Last 24 hours) at 08/08/2018 1326 Last data filed at 08/08/2018 4818 Gross per 24 hour  Intake 730.22 ml  Output 200 ml  Net 530.22 ml   Filed Weights   08/06/18 0000  Weight: 59 kg   Examination: Physical Exam:  Constitutional: Elderly pleasantly demented Caucasian female currently no acute distress appears calm Eyes: Lids and conjunctive are normal.  Sclera anicteric ENMT: External ears nose appear normal.  Grossly normal hearing. Neck: Appears supple no JVD Respiratory: Mildly diminished auscultation bilaterally no appreciable wheezing, rales, rhonchi.  Patient not tachypneic wheezing and accessory muscle breathe Cardiovascular: Regular rate and rhythm.  Has a slight 2/6 systolic murmur.  No lower extremity edema noted. Abdomen: Soft, nontender, nondistended.  Bowel sounds present Musculoskeletal: No contractures or cyanosis.  No joint deformities in upper lower extremities. Skin: No appreciable rashes or lesions on skin evaluation.  Skin is warm and dry Neurologic: Cranial nerves II through XII grossly intact no appreciable focal deficits.  Romberg sign cerebellar reflexes  were not assessed Psychiatric: She is awake and alert.  Normal mood and affect.  Pleasantly demented.  Data Reviewed: I have personally reviewed following labs and imaging studies  CBC: Recent Labs  Lab 08/06/18 0111 08/06/18 0743 08/07/18 0412 08/08/18 0514  WBC 13.3* 10.4 10.6* 8.8  NEUTROABS 10.0* 8.0*  --  6.3  HGB 10.6* 8.9* 8.6* 8.5*  HCT 32.8* 27.8* 26.7* 26.7*  MCV 90.1 90.6 91.4 92.4  PLT 214 199 172 563   Basic Metabolic Panel: Recent Labs  Lab 08/06/18 0111 08/06/18 0743 08/07/18 0412 08/08/18 0514  NA 135 136 139 139  K 4.2 4.1 4.2 4.5  CL 104 110 111 113*  CO2 20* 20* 20* 21*  GLUCOSE 113* 98 87 99  BUN 46* 42*  36* 36*  CREATININE 2.07* 1.81* 1.67* 1.34*  CALCIUM 9.4 8.6* 8.6* 8.8*  MG  --   --   --  1.8  PHOS  --   --   --  2.9   GFR: Estimated Creatinine Clearance: 27 mL/min (A) (by C-G formula based on SCr of 1.34 mg/dL (H)). Liver Function Tests: Recent Labs  Lab 08/06/18 0111 08/06/18 0743 08/07/18 0412 08/08/18 0514  AST 51* 44* 18 47*  ALT 81* 67* 47* 67*  ALKPHOS 253* 225* 197* 263*  BILITOT 0.4 0.2* 0.4 0.3  PROT 6.5 5.7* 5.3* 5.2*  ALBUMIN 3.0* 2.6* 2.4* 2.1*   No results for input(s): LIPASE, AMYLASE in the last 168 hours. Recent Labs  Lab 08/06/18 0743  AMMONIA <9*   Coagulation Profile: No results for input(s): INR, PROTIME in the last 168 hours. Cardiac Enzymes: Recent Labs  Lab 08/06/18 0743  TROPONINI 0.03*   BNP (last 3 results) No results for input(s): PROBNP in the last 8760 hours. HbA1C: No results for input(s): HGBA1C in the last 72 hours. CBG: No results for input(s): GLUCAP in the last 168 hours. Lipid Profile: No results for input(s): CHOL, HDL, LDLCALC, TRIG, CHOLHDL, LDLDIRECT in the last 72 hours. Thyroid Function Tests: Recent Labs    08/06/18 0743  TSH 1.031   Anemia Panel: Recent Labs    08/06/18 0743  VITAMINB12 2,259*  FOLATE 24.1  FERRITIN 180  TIBC 151*  IRON 12*  RETICCTPCT 0.9    Sepsis Labs: No results for input(s): PROCALCITON, LATICACIDVEN in the last 168 hours.  Recent Results (from the past 240 hour(s))  SARS Coronavirus 2 (CEPHEID - Performed in [place] hospital lab), Hosp Order     Status: None   Collection Time: 08/06/18  4:31 AM  Result Value Ref Range Status   SARS Coronavirus 2 NEGATIVE NEGATIVE Final    Comment: (NOTE) If result is NEGATIVE SARS-CoV-2 target nucleic acids are NOT DETECTED. The SARS-CoV-2 RNA is generally detectable in upper and lower  respiratory specimens during the acute phase of infection. The lowest  concentration of SARS-CoV-2 viral copies this assay can detect is 250  copies / mL. A negative result does not preclude SARS-CoV-2 infection  and should not be used as the sole basis for treatment or other  patient management decisions.  A negative result may occur with  improper specimen collection / handling, submission of specimen other  than nasopharyngeal swab, presence of viral mutation(s) within the  areas targeted by this assay, and inadequate number of viral copies  (<250 copies / mL). A negative result must be combined with clinical  observations, patient history, and epidemiological information. If result is POSITIVE SARS-CoV-2 target nucleic acids are DETECTED. The SARS-CoV-2 RNA is generally detectable in upper and lower  respiratory specimens dur ing the acute phase of infection.  Positive  results are indicative of active infection with SARS-CoV-2.  Clinical  correlation with patient history and other diagnostic information is  necessary to determine patient infection status.  Positive results do  not rule out bacterial infection or co-infection with other viruses. If result is PRESUMPTIVE POSTIVE SARS-CoV-2 nucleic acids MAY BE PRESENT.   A presumptive positive result was obtained on the submitted specimen  and confirmed on repeat testing.  While 2019 novel coronavirus  (SARS-CoV-2) nucleic acids may be  present in the submitted sample  additional confirmatory testing may be necessary for epidemiological  and / or clinical management purposes  to differentiate between  SARS-CoV-2  and other Sarbecovirus currently known to infect humans.  If clinically indicated additional testing with an alternate test  methodology [phone]) is advised. The SARS-CoV-2 RNA is generally  detectable in upper and lower respiratory sp ecimens during the acute  phase of infection. The expected result is Negative. Fact Sheet for Patients:  StrictlyIdeas.no Fact Sheet for Healthcare Providers: BankingDealers.co.za This test is not yet approved or cleared by the [place] FDA and has been authorized for detection and/or diagnosis of SARS-CoV-2 by FDA under an Emergency Use Authorization (EUA).  This EUA will remain in effect (meaning this test can be used) for the duration of the COVID-19 declaration under Section 564(b)(1) of the Act, 21 U.S.C. section 360bbb-3(b)(1), unless the authorization is terminated or revoked sooner. Performed at [facility], [place] [address] [place], [place] [address]   Culture, Urine     Status: Abnormal   Collection Time: 08/07/18 10:42 AM  Result Value Ref Range Status   Specimen Description   Final    URINE, RANDOM Performed at [place] [address] [place], [place] [address]    Special Requests   Final    NONE Performed at [facility], [place] [address] [place], [place] [address]    Culture (A)  Final    <10,000 COLONIES/mL INSIGNIFICANT GROWTH Performed at [facility] [address], [place], [place] [address]    Report Status 08/08/2018 FINAL  Final    Radiology Studies: Dg Lumbar Spine 2-3 Views  Result Date: 08/08/2018 CLINICAL DATA:  Pain across lower back.  No injury EXAM: LUMBAR SPINE - 2-3 VIEW COMPARISON:  CT 08/06/2018 FINDINGS: Diffuse  degenerative disc and facet disease throughout the lumbar spine. No fracture. No malalignment. SI joints symmetric and unremarkable. Diffuse aortic atherosclerosis. No aneurysm. IMPRESSION: Diffuse degenerative disc and facet disease. No acute bony abnormality. Electronically Signed   By: Rolm Baptise M.D.   On: 08/08/2018 10:12   Scheduled Meds: . aspirin EC  81 mg Oral Daily  . donepezil  10 mg Oral QHS  . feeding supplement (ENSURE ENLIVE)  237 mL Oral BID BM  . heparin injection (subcutaneous)  5,000 Units Subcutaneous Q8H  . memantine  10 mg Oral BID  . omega-3 acid ethyl esters  1 capsule Oral Daily  . oxybutynin  5 mg Oral BID  . venlafaxine XR  75 mg Oral Q breakfast  . vitamin C  500 mg Oral Daily  . vitamin E  200 Units Oral TID   Continuous Infusions: . sodium chloride 50 mL/hr at 08/08/18 0938  . cefTRIAXone (ROCEPHIN)  IV 1 g (08/07/18 2111)    LOS: 1 day   Kerney Elbe, DO [facility] PAGER is on [place]  If 7PM-7AM, please contact night-coverage [email or web address] Password [email or web address] 08/08/2018, 1:26 PM

## 2018-08-08 NOTE — Progress Notes (Signed)
Nutrition Follow-up  INTERVENTION:   -Continue Ensure Enlive po BID, each supplement provides 350 kcal and 20 grams of protein -Continue Magic cup TID with meals, each supplement provides 290 kcal and 9 grams of protein  NUTRITION DIAGNOSIS:   Inadequate oral intake related to lethargy/confusion as evidenced by per patient/family report.  Ongoing.  GOAL:   Patient will meet greater than or equal to 90% of their needs  Progressing.  MONITOR:   PO intake, Supplement acceptance, Labs, Weight trends, I & O's, Skin  REASON FOR ASSESSMENT:   Consult Assessment of nutrition requirement/status, Poor PO  ASSESSMENT:   85 y.o. female with history of hypertension, dementia, and anal cancer was recently admitted last month for confusion and at that time patient was found to be having hypercalcemia renal failure and UTI was subsequently discharged home was found by family that the last few days patient has become increasingly lethargic poor p.o. intake and decreased ambulation. Patient admitted for further management of weakness encephalopathy and acute renal failure with UTI.  **RD working remotely**  Patient consumed 25-50% of meals 5/20. Pt currently eating better, consumed 100% of oatmeal and sausage, OJ and milk for breakfast per documentation. Pt is accepting at least 1 Ensure supplement each day. Will continue current supplements ordered.  No new weights measured since 5/19.  Medications: LOVAZA capsule daily, Vitamin C tablet daily, Vitamin E capsule TID Labs reviewed: Mg/Phos WNL GFR: 36  Diet Order:   Diet Order            Diet Heart Room service appropriate? Yes; Fluid consistency: Thin  Diet effective now              EDUCATION NEEDS:   Not appropriate for education at this time  Skin:  Skin Assessment: Skin Integrity Issues: Skin Integrity Issues:: Other (Comment) Other: MASD: buttocks, perineum  Last BM:  5/17  Height:   Ht Readings from Last 1  Encounters:  08/06/18 5\' 4"  (1.626 m)    Weight:   Wt Readings from Last 1 Encounters:  08/06/18 59 kg    Ideal Body Weight:  54.5 kg  BMI:  Body mass index is 22.31 kg/m.  Estimated Nutritional Needs:   Kcal:  7741-2878  Protein:  65-75g  Fluid:  1.6L/day  Clayton Bibles, MS, RD, LDN [place] [facility] Pager: [phone] After Hours Pager: [phone]

## 2018-08-09 DIAGNOSIS — G9340 Encephalopathy, unspecified: Secondary | ICD-10-CM | POA: Diagnosis not present

## 2018-08-09 DIAGNOSIS — R41841 Cognitive communication deficit: Secondary | ICD-10-CM | POA: Diagnosis not present

## 2018-08-09 DIAGNOSIS — Z743 Need for continuous supervision: Secondary | ICD-10-CM | POA: Diagnosis not present

## 2018-08-09 DIAGNOSIS — R279 Unspecified lack of coordination: Secondary | ICD-10-CM | POA: Diagnosis not present

## 2018-08-09 DIAGNOSIS — N12 Tubulo-interstitial nephritis, not specified as acute or chronic: Secondary | ICD-10-CM | POA: Diagnosis not present

## 2018-08-09 DIAGNOSIS — N179 Acute kidney failure, unspecified: Secondary | ICD-10-CM | POA: Diagnosis not present

## 2018-08-09 DIAGNOSIS — R531 Weakness: Secondary | ICD-10-CM | POA: Diagnosis not present

## 2018-08-09 DIAGNOSIS — G9341 Metabolic encephalopathy: Secondary | ICD-10-CM | POA: Diagnosis not present

## 2018-08-09 DIAGNOSIS — R2689 Other abnormalities of gait and mobility: Secondary | ICD-10-CM | POA: Diagnosis not present

## 2018-08-09 DIAGNOSIS — N178 Other acute kidney failure: Secondary | ICD-10-CM | POA: Diagnosis not present

## 2018-08-09 DIAGNOSIS — N390 Urinary tract infection, site not specified: Secondary | ICD-10-CM | POA: Diagnosis not present

## 2018-08-09 DIAGNOSIS — R410 Disorientation, unspecified: Secondary | ICD-10-CM | POA: Diagnosis not present

## 2018-08-09 DIAGNOSIS — I10 Essential (primary) hypertension: Secondary | ICD-10-CM | POA: Diagnosis not present

## 2018-08-09 DIAGNOSIS — R945 Abnormal results of liver function studies: Secondary | ICD-10-CM | POA: Diagnosis not present

## 2018-08-09 DIAGNOSIS — N1330 Unspecified hydronephrosis: Secondary | ICD-10-CM | POA: Diagnosis not present

## 2018-08-09 DIAGNOSIS — R402411 Glasgow coma scale score 13-15, in the field [EMT or ambulance]: Secondary | ICD-10-CM | POA: Diagnosis not present

## 2018-08-09 DIAGNOSIS — R338 Other retention of urine: Secondary | ICD-10-CM | POA: Diagnosis not present

## 2018-08-09 DIAGNOSIS — N183 Chronic kidney disease, stage 3 (moderate): Secondary | ICD-10-CM | POA: Diagnosis not present

## 2018-08-09 DIAGNOSIS — R2681 Unsteadiness on feet: Secondary | ICD-10-CM | POA: Diagnosis not present

## 2018-08-09 DIAGNOSIS — I159 Secondary hypertension, unspecified: Secondary | ICD-10-CM | POA: Diagnosis not present

## 2018-08-09 DIAGNOSIS — F0390 Unspecified dementia without behavioral disturbance: Secondary | ICD-10-CM | POA: Diagnosis not present

## 2018-08-09 DIAGNOSIS — R278 Other lack of coordination: Secondary | ICD-10-CM | POA: Diagnosis not present

## 2018-08-09 DIAGNOSIS — N3000 Acute cystitis without hematuria: Secondary | ICD-10-CM | POA: Diagnosis not present

## 2018-08-09 DIAGNOSIS — W19XXXA Unspecified fall, initial encounter: Secondary | ICD-10-CM | POA: Diagnosis not present

## 2018-08-09 LAB — COMPREHENSIVE METABOLIC PANEL
ALT: 72 U/L — ABNORMAL HIGH (ref 0–44)
AST: 37 U/L (ref 15–41)
Albumin: 2.3 g/dL — ABNORMAL LOW (ref 3.5–5.0)
Alkaline Phosphatase: 281 U/L — ABNORMAL HIGH (ref 38–126)
Anion gap: 8 (ref 5–15)
BUN: 34 mg/dL — ABNORMAL HIGH (ref 8–23)
CO2: 18 mmol/L — ABNORMAL LOW (ref 22–32)
Calcium: 8.9 mg/dL (ref 8.9–10.3)
Chloride: 113 mmol/L — ABNORMAL HIGH (ref 98–111)
Creatinine, Ser: 1.28 mg/dL — ABNORMAL HIGH (ref 0.44–1.00)
GFR calc Af Amer: 44 mL/min — ABNORMAL LOW (ref >60)
GFR calc non Af Amer: 38 mL/min — ABNORMAL LOW (ref >60)
Glucose, Bld: 91 mg/dL (ref 70–99)
Potassium: 4.1 mmol/L (ref 3.5–5.1)
Sodium: 139 mmol/L (ref 135–145)
Total Bilirubin: 0.3 mg/dL (ref 0.3–1.2)
Total Protein: 5.4 g/dL — ABNORMAL LOW (ref 6.5–8.1)

## 2018-08-09 LAB — CBC WITH DIFFERENTIAL/PLATELET
Abs Immature Granulocytes: 0.53 10*3/uL — ABNORMAL HIGH (ref 0.00–0.07)
Basophils Absolute: 0.1 10*3/uL (ref 0.0–0.1)
Basophils Relative: 1 %
Eosinophils Absolute: 0.4 10*3/uL (ref 0.0–0.5)
Eosinophils Relative: 4 %
HCT: 28 % — ABNORMAL LOW (ref 36.0–46.0)
Hemoglobin: 9 g/dL — ABNORMAL LOW (ref 12.0–15.0)
Immature Granulocytes: 6 %
Lymphocytes Relative: 11 %
Lymphs Abs: 1 10*3/uL (ref 0.7–4.0)
MCH: 29.4 pg (ref 26.0–34.0)
MCHC: 32.1 g/dL (ref 30.0–36.0)
MCV: 91.5 fL (ref 80.0–100.0)
Monocytes Absolute: 0.6 10*3/uL (ref 0.1–1.0)
Monocytes Relative: 6 %
Neutro Abs: 7 10*3/uL (ref 1.7–7.7)
Neutrophils Relative %: 72 %
Platelets: 179 10*3/uL (ref 150–400)
RBC: 3.06 MIL/uL — ABNORMAL LOW (ref 3.87–5.11)
RDW: 12.9 % (ref 11.5–15.5)
WBC: 9.5 10*3/uL (ref 4.0–10.5)
nRBC: 0 % (ref 0.0–0.2)

## 2018-08-09 LAB — PHOSPHORUS: Phosphorus: 3 mg/dL (ref 2.5–4.6)

## 2018-08-09 LAB — MAGNESIUM: Magnesium: 1.7 mg/dL (ref 1.7–2.4)

## 2018-08-09 MED ORDER — SODIUM BICARBONATE 650 MG PO TABS
650.0000 mg | ORAL_TABLET | Freq: Two times a day (BID) | ORAL | Status: DC
  Administered 2018-08-09: 650 mg via ORAL
  Filled 2018-08-09: qty 1

## 2018-08-09 MED ORDER — MAGNESIUM SULFATE IN D5W 1-5 GM/100ML-% IV SOLN
1.0000 g | Freq: Once | INTRAVENOUS | Status: AC
  Administered 2018-08-09: 1 g via INTRAVENOUS
  Filled 2018-08-09: qty 100

## 2018-08-09 MED ORDER — SODIUM CHLORIDE 0.9 % IV SOLN
1.0000 g | INTRAVENOUS | Status: DC
  Administered 2018-08-09: 1 g via INTRAVENOUS
  Filled 2018-08-09: qty 1

## 2018-08-09 MED ORDER — ENSURE ENLIVE PO LIQD: 237.0000 mL | Freq: Two times a day (BID) | ORAL | 12 refills | Status: AC

## 2018-08-09 MED ORDER — SODIUM BICARBONATE 650 MG PO TABS: 650.0000 mg | ORAL_TABLET | Freq: Every day | ORAL | 0 refills | Status: DC

## 2018-08-09 NOTE — Progress Notes (Signed)
Called and gave report to RN Shree at [facility] facility. RN verbalized no further questions.

## 2018-08-09 NOTE — Plan of Care (Signed)
Pt had good urine OP this shift and denied pain.

## 2018-08-09 NOTE — TOC Transition Note (Signed)
Transition of Care [facility]) - CM/SW Discharge Note   Patient Details  Name: MARIEELENA BARTKO MRN: [id number] Date of Birth: October 05, 1931  Transition of Care [facility]) CM/SW Contact:  Wende Neighbors, LCSW Phone Number: 08/09/2018, 2:26 PM   Clinical Narrative:   Patient to discharge to [facility]. [facility] pick up has been scheduled for a 3:30 pm pick up. Family aware of discharge. RN to call [phone] (rm# [phone]) for report.    Final next level of care: Skilled Nursing Facility Barriers to Discharge: No Barriers Identified   Patient Goals and CMS Choice Patient states their goals for this hospitalization and ongoing recovery are:: unable to state CMS Medicare.gov Compare Post Acute Care list provided to:: Other (Comment Required)(will provide to POA once offers are made) Choice offered to / list presented to : [facility] POA / Guardian  Discharge Placement PASRR number recieved: 08/09/18 Existing PASRR number confirmed : 08/09/18            Patient to be transferred to facility by: ptar Name of family member notified: POA Patient and family notified of of transfer: 08/09/18  Discharge Plan and Services In-house Referral: Clinical Social Work Discharge Planning Services: [employer] Consult Post Acute Care Choice: [place]                               Social Determinants of Health (SDOH) Interventions     Readmission Risk Interventions No flowsheet data found.

## 2018-08-09 NOTE — Discharge Summary (Signed)
Physician Discharge Summary  Shannon Blair WVP:[id number] DOB: 1931/11/11 DOA: 08/05/2018  PCP: Merrilee Seashore, MD  Admit date: 08/05/2018 Discharge date: 08/09/2018  Admitted From: Home Disposition: SNF  Recommendations for Outpatient Follow-up:  1. Follow up with PCP in 1-2 weeks 2. Follow up with Orthopedic Surgery for Back Fusion as an outpatient 3. Follow up with Urology in the outpatient setting in 1-2 weeks  4. Please obtain CMP/CBC, Mag, Phos in one week 5. Please follow up on the following pending results:  Home Health: No Equipment/Devices: None recommended by PT and deferred to Next Venue     Discharge Condition: Stable CODE STATUS: DO NOT RESUSCITATE  Diet recommendation: Heart Healthy Diet   Brief/Interim Summary: HPI per Dr. Gean Birchwood on 08/06/2018 Shannon Blair a 85 y.o.femalewithhistory of hypertension, dementia, and anal cancer was recently admitted last month for confusion and at that time patient was found to be having hypercalcemia renal failure and UTI was subsequently discharged home was found by family that the last few days patient has become increasingly lethargic poor p.o. intake and decreased ambulation.Patient was finding it to ambulate because patient found weak to walk and her legs were giving way. Patient also complained of low back pain. Has not had any chest pain nausea vomiting or diarrhea. Most of the history was obtained from the patient's healthcare power of attorney who discussed with the ER physician.  ED Course:In the ER patient is oriented to her name follows commands moves all extremities labs show creatinine of 2.07 which is increased from 1.2 when patient was discharged last month. AST is 51 ALT 81 anion gap is 11. WBC count is 13.3 hemoglobin 10.6 platelets 214. Urine shows more than 50 WBC large leukocyte esterase nitrite is positive bacteria rare RBC 11-20. Given the patient has been recurrent UTI with renal  failure CT renal study was done which shows left kidney with hydroureteronephrosis with thick-walled bladder and debris's. Prior radiation changes seen. Patient was started on fluids 1 L normal saline as a bolus and ceftriaxone started after urine cultures obtained. Patient admitted for further management of weakness encephalopathy and acute renal failure with UTI.  **Interim History Still remains weak but is improving daily.  PT OT recommending SNF and PoA agreeable.  Continued IV fluid hydration and IV Abx to stop today. Hospitalization has been complicated by Metabolic Acidosis and Abnormal LFTs so RUQ U/S was ordered for LFTs.  Right upper quadrant ultrasound showed a normal sonographic appearance of the liver and there was cholelithiasis without sonographic evidence for acute cholecystitis.  Patient not complaining of any abdominal pain.  She was given sodium bicarbonate for her metabolic acidosis and IV fluids have now stopped.  She was deemed medically stable to be discharged to skilled nursing facility and she will need to follow-up with PCP within 1 week.  Prior to discharge she ended up having a renal ultrasound done which showed that she had moderate left hydronephrosis that was still there without obstructing calculus.  There is also a crescent shaped solid density noted posteriorly in the bladder which did not appear to move and may represent dependent debris but a focal bladder wall thickening or mass cannot be excluded and cystoscopy is recommended.  Urology recommendations she is not a candidate for any major surgery because she is a very poor operative candidate with little functional capacity is unable to understand her problem.  She will need to follow-up with urology in the outpatient setting.   Discharge Diagnoses:  Principal Problem:   Acute encephalopathy Active Problems:   HTN (hypertension)   Dementia (HCC)   UTI (urinary tract infection)   Pyelonephritis   ARF (acute  renal failure) (HCC)   Hydronephrosis  Acute Infectious encephalopathy worsened in the setting of urinary tract infection on concomitant longstanding dementia, improving  -Discussed with patient's legal guardian and feels that her urinary tract infection is significantly altered patient's mental status.  Normally patient is a poor historian however acutely she became more confused but now is getting better  -Changed to inpatient status given continued confusion -I discussed with the patient's legal guardian and normally she is ambulatory baseline with a rolling walker.  PT evaluation done and she was moderately confused and had difficulty maintaining attention on task evaluation requiring significant redirection. PT Recommending SNF and Legal Sammuel Bailiff is agreeable and will pick SNF -Patient had poor p.o. intake at home and has not been eating so he need IV fluid hydration until today and now stopped. -Encephalopathy is improving but will continue monitor -SARS-CoV-2 is negative -Head CT w/o Contrast showed "No acute finding or change from prior. Chronic small vessel ischemia with moderate atrophy." -C/w Delirium precautions -Continue to treat infection and today's last day of her antibiotics with IV ceftriaxone -Stable to discharge to skilled nursing facility.  Acute Urinary tract Infection -Urinalysis showed cloudy appearance with small hemoglobin, large leukocytes, positive nitrites, rare bacteria, 11-20 red blood cells per high-power field, greater than 50 WBCs present, urine culture was never done so we will order one now -Urine Cx done after Abx had been hung; Urine Cx showed <10,000 CFU of Insignificant Growth  -Blood cultures were never done as well so Ordered today -Continue with empiric antibiotics with IV ceftriaxone for now and today is 5/5 -Follow Cx's but they have little value now that she had been on Abx  Generalized weakness in the setting of infection along with poor p.o.  intake -Continued gentle IV fluid hydration with normal saline at a rate of 50 mL/hr and this has now stopped  -Repeat CMP in a.m. -PT OT to evaluate and treat and recommending SNF -Nutritionisit on board for poor po intake recommending Ensure Enlive p.o. twice daily along with Magic cups 3 times daily with meals  Left-Sided hydroureteronephrosis and a history of a patient with a solitary kidney (Had a Nephrectomy in the Past) -Currently afebrile does not have any signs of sepsis -Urology was consulted and feels that the left hydronephrosis is likely secondary to cystitis and is transient versus a distal stricture from prior radiation -C/w Oxybutynin 5 mg po BID  -Obtained a Renal U/S and showed "Status post right nephrectomy. Moderate left hydronephrosis is noted without obstructing calculus. Crescent-shaped solid density is noted posteriorly in the bladder which does not appear to move. While this may represent dependent debris, possible focal bladder wall thickening or mass cannot be excluded." -Cystoscopy was recommended however Urology has weighed in -Urology feels that she is a poor operative candidate given her functional capacity and states that if the GFR fails to improve we should consider a nuclear medicine renogram versus palliative transition -Can follow up with Urology in the outpatient setting   Chronic Dementia -Continue with Donepezil 10 mg p.o. nightly along with memantine 10 mg p.o. twice daily  Abnormal LFTs -Likely reactive and improving initially but acutely worsened again  -Hepatitis panel is negative -AST is trended down to 18 and is now 37 (was 51 on admission).  ALT is now trended down  as well and was 47 but now is 72 (81 on admission). -Checked RUQ U/S and showed "Normal sonographic appearance of the liver. Cholelithiasis without sonographic evidence of acute Cholecystitis." -Repeat CMP in the a.m. and Continue monitor and trend LFTs  AKI on CKD Stage 3b,  improved -Improving.  Likely in the setting of poor p.o. intake and acute urinary tract infection -Baseline Cr around 0.8-1.2 -BUN/Cr went from 46/2.07 is now 34/1.28 -IVF Hydration now Discontinued  -Held Irbesartan-HCTZ but will completely Discontinue for now and have PCP evaluate need to reinitiate  -Stopped Naproxen as well  -Continue to Monitor and Trend Renal Fxn -Repeat CMP at SNF  Poor Oral Intake and Dehydration, improved -IVF now Discontinued  -Continue to Monitor  -Consult Nutritionist for further evaluation and recommendations -C/w Ensure Enlive 237 mL po BID  -C/w Magic Cup TIDwm  Non-Gap Metabolic Acidosis -Mild as CO2 was 20. AG was 8; -Now CO2 is 18 and AG is 8; Chloride was slightly high today at 113 so will continue to monitor closely  -IVF Hydration as above now stopped -Given Sodium Bicarbonate 650 mg po BID  -Repeat CMP at SNF  Normocytic Anemia/Anemia of Chronic Kidney Disease -Patient's hemoglobin/hematocrit went from 10.6/32.8 is now 9.0/28.0 -Likely dilutional drop in the setting of IV fluid hydration -Anemia panel done showed an iron level of 12, U IBC 139, TIBC 151, saturation ratios of 8%, ferritin level 180, folate level 24.1, vitamin B12 of 2259 -Continue to Monitor for S/Sx of Bleeding  -Repeat CBC at SNF   Back Pain -Checked Lumbar X-Ray and showed "Diffuse degenerative disc and facet disease throughout the lumbar spine. No fracture. No malalignment. SI joints symmetric and unremarkable. Diffuse aortic atherosclerosis. No aneurysm." -Has a Hx of Microdiscetomy in 2018 -C/w PT/OT to evaluate and treat -Will need to follow up with her Orthopedic Surgeon at D/C    Leukocytosis -Patient's WBC went from 13.3 -> 10.4 -> 10.6 -> 8.8 -> 9.5 -Continued to Treat Infection with IV Ceftriaxone and monitor for any worsening -Continue to Monitor and repeat CBC as an outpatient   Discharge Instructions Discharge Instructions    Call MD for:   difficulty breathing, headache or visual disturbances   Complete by:  As directed    Call MD for:  extreme fatigue   Complete by:  As directed    Call MD for:  hives   Complete by:  As directed    Call MD for:  persistant dizziness or light-headedness   Complete by:  As directed    Call MD for:  persistant nausea and vomiting   Complete by:  As directed    Call MD for:  redness, tenderness, or signs of infection (pain, swelling, redness, odor or green/yellow discharge around incision site)   Complete by:  As directed    Call MD for:  severe uncontrolled pain   Complete by:  As directed    Call MD for:  temperature >100.4   Complete by:  As directed    Diet - low sodium heart healthy   Complete by:  As directed    Discharge instructions   Complete by:  As directed    You were cared for by a hospitalist during your hospital stay. If you have any questions about your discharge medications or the care you received while you were in the hospital after you are discharged, you can call the unit and ask to speak with the hospitalist on call if the hospitalist that took  care of you is not available. Once you are discharged, your primary care physician will handle any further medical issues. Please note that NO REFILLS for any discharge medications will be authorized once you are discharged, as it is imperative that you return to your primary care physician (or establish a relationship with a primary care physician if you do not have one) for your aftercare needs so that they can reassess your need for medications and monitor your lab values.  Follow up with PCP, Urology, and Orthopedic Surgery. Take all medications as prescribed. If symptoms change or worsen please return to the ED for evaluation   Increase activity slowly   Complete by:  As directed      Allergies as of 08/09/2018   No Known Allergies     Medication List    STOP taking these medications   irbesartan-hydrochlorothiazide  300-12.5 MG tablet Commonly known as:  AVALIDE   naproxen sodium 220 MG tablet Commonly known as:  ALEVE     TAKE these medications   acetaminophen 325 MG tablet Commonly known as:  TYLENOL Take 650 mg by mouth every 6 (six) hours as needed for mild pain.   aspirin EC 81 MG tablet Take 81 mg by mouth daily.   donepezil 10 MG tablet Commonly known as:  ARICEPT Take 10 mg by mouth at bedtime.   feeding supplement (ENSURE ENLIVE) Liqd Take 237 mLs by mouth 2 (two) times daily between meals. Start taking on:  Aug 10, 2018   Fish Oil 1000 MG Caps Take 1 capsule by mouth daily.   memantine 10 MG tablet Commonly known as:  NAMENDA Take 10 mg by mouth 2 (two) times daily.   multivitamin with minerals Tabs tablet Take 1 tablet by mouth daily.   OSTEO BI-FLEX ADV JOINT SHIELD PO Take 1 tablet by mouth daily.   oxybutynin 5 MG tablet Commonly known as:  DITROPAN Take 5 mg by mouth 2 (two) times daily.   sodium bicarbonate 650 MG tablet Take 1 tablet (650 mg total) by mouth daily.   venlafaxine XR 75 MG 24 hr capsule Commonly known as:  EFFEXOR-XR Take 75 mg by mouth daily with breakfast.   vitamin C 500 MG tablet Commonly known as:  ASCORBIC ACID Take 500 mg by mouth daily.   vitamin E 200 UNIT capsule Take 200 Units by mouth 3 (three) times daily.       No Known Allergies  Consultations:  Urology  Procedures/Studies: Dg Lumbar Spine 2-3 Views  Result Date: 08/08/2018 CLINICAL DATA:  Pain across lower back.  No injury EXAM: LUMBAR SPINE - 2-3 VIEW COMPARISON:  CT 08/06/2018 FINDINGS: Diffuse degenerative disc and facet disease throughout the lumbar spine. No fracture. No malalignment. SI joints symmetric and unremarkable. Diffuse aortic atherosclerosis. No aneurysm. IMPRESSION: Diffuse degenerative disc and facet disease. No acute bony abnormality. Electronically Signed   By: Rolm Baptise M.D.   On: 08/08/2018 10:12   Ct Head Wo Contrast  Result Date:  08/06/2018 CLINICAL DATA:  Altered level of consciousness EXAM: CT HEAD WITHOUT CONTRAST TECHNIQUE: Contiguous axial images were obtained from the base of the skull through the vertex without intravenous contrast. COMPARISON:  07/05/2018 FINDINGS: Brain: No evidence of acute infarction, hemorrhage, hydrocephalus, extra-axial collection or mass lesion/mass effect. Confluent low-density in the cerebral white matter attributed to chronic small vessel ischemia. Cerebral volume loss with congruent ventriculomegaly. Vascular: Atherosclerotic calcification Skull: No acute or aggressive finding Sinuses/Orbits: Bilateral cataract resection. IMPRESSION: 1. No acute  finding or change from prior. 2. Chronic small vessel ischemia with moderate atrophy. Electronically Signed   By: Monte Fantasia M.D.   On: 08/06/2018 09:37   US Renal  Result Date: 08/08/2018 CLINICAL DATA:  Acute kidney injury. EXAM: RENAL / URINARY TRACT ULTRASOUND COMPLETE COMPARISON:  CT scan of Aug 06, 2018. FINDINGS: Status post right nephrectomy. Left Kidney: Renal measurements: 11.5 x 6.9 x 5.8 cm = volume: 241 mL. Echogenicity within normal limits. Moderate hydronephrosis is noted. 1.1 mm simple cyst is seen in upper pole. Bladder: Bilateral ureteral jets are not visualized. There does appear to be crescent-shaped solid density posteriorly in the bladder which does not appear to move. While this may represent dependent debris, possible focal bladder wall thickening or mass cannot be excluded. IMPRESSION: Status post right nephrectomy. Moderate left hydronephrosis is noted without obstructing calculus. Crescent-shaped solid density is noted posteriorly in the bladder which does not appear to move. While this may represent dependent debris, possible focal bladder wall thickening or mass cannot be excluded. Cystoscopy is recommended. Electronically Signed   By: Marijo Conception M.D.   On: 08/08/2018 15:36   Ct Renal Stone Study  Result Date:  08/06/2018 CLINICAL DATA:  Hematuria with unknown cause EXAM: CT ABDOMEN AND PELVIS WITHOUT CONTRAST TECHNIQUE: Multidetector CT imaging of the abdomen and pelvis was performed following the standard protocol without IV contrast. COMPARISON:  None. FINDINGS: Lower chest: Atelectasis or scarring in the lower lungs that is mild. Hepatobiliary: No focal liver abnormality.No evidence of biliary obstruction or stone. Pancreas: Generalized atrophy Spleen: Generous splenic size, stable from 2017 chest CT. Adrenals/Urinary Tract: Negative adrenals. Right nephrectomy. Prominent left hydroureteronephrosis to the level of the urinary bladder which is circumferentially thick walled. There is dependent soft tissue density with fluid fluid level in the bladder. Punctate left renal calculus. No obstructing stone. Unremarkable bladder. Stomach/Bowel: No obstruction. No evidence of acute bowel inflammation. No gross mass in this patient with history of remote anal cancer. Sigmoid diverticulosis. Vascular/Lymphatic: Diffuse atherosclerotic calcification. No mass or adenopathy. Reproductive:Hysterectomy. Other: Stranding in the pelvis attributed to scarring from prior radiotherapy. No ascites or pneumoperitoneum Musculoskeletal: Spinal degeneration and bilateral hip osteoarthritis. Remote bilateral eleventh rib fractures. Remote right twelfth rib fracture. IMPRESSION: 1. Hydroureteronephrosis above the thick walled bladder which contains debris. There is history of UTI; the patient has also had prior pelvic radiotherapy. The left kidney is solitary. 2. Punctate left renal calculus. Electronically Signed   By: Monte Fantasia M.D.   On: 08/06/2018 04:29   US Abdomen Limited Ruq  Result Date: 08/08/2018 CLINICAL DATA:  Abnormal liver function tests, prior right nephrectomy EXAM: ULTRASOUND ABDOMEN LIMITED RIGHT UPPER QUADRANT COMPARISON:  08/06/2018 FINDINGS: Gallbladder: Cholelithiasis measuring up to 9 mm. Top normal gallbladder  wall thickness measuring up to 3 mm. No pericholecystic fluid. Negative sonographic Murphy sign. Common bile duct: Diameter: 2.7 mm Liver: No focal lesion identified. Within normal limits in parenchymal echogenicity. Portal vein is patent on color Doppler imaging with normal direction of blood flow towards the liver. IMPRESSION: 1. Normal sonographic appearance of the liver. 2. Cholelithiasis without sonographic evidence of acute cholecystitis. Electronically Signed   By: Kathreen Devoid   On: 08/08/2018 15:30    Subjective: Seen and examined at bedside and she was sitting in the chair in no acute distress.  She is states that she is wanting her hair washed.  No nausea or vomiting.  States that she is "not sick".  No other concerns or complaints  at this time and has been deemed stable to be discharged to a skilled nursing facility.  Discharge Exam: Vitals:   08/09/18 0846 08/09/18 1354  BP:  (!) 142/65  Pulse:  70  Resp:  16  Temp:  97.7 F (36.5 C)  SpO2: 96% 100%   Vitals:   08/08/18 2005 08/09/18 0523 08/09/18 0846 08/09/18 1354  BP: (!) 161/54 (!) 153/54  (!) 142/65  Pulse: 66 63  70  Resp: 18 18  16   Temp: 98.2 F (36.8 C) 98.3 F (36.8 C)  97.7 F (36.5 C)  TempSrc: Oral   Oral  SpO2: 100% 100% 96% 100%  Weight:      Height:       General: Pt is awake, not in acute distress Cardiovascular: RRR, S1/S2 +, no rubs, no gallops Respiratory: Diminished bilaterally, no wheezing, no rhonchi Abdominal: Soft, NT, ND, bowel sounds + Extremities: no edema, no cyanosis  The results of significant diagnostics from this hospitalization (including imaging, microbiology, ancillary and laboratory) are listed below for reference.    Microbiology: Recent Results (from the past 240 hour(s))  SARS Coronavirus 2 (CEPHEID - Performed in [facility] hospital lab), Hosp Order     Status: None   Collection Time: 08/06/18  4:31 AM  Result Value Ref Range Status   SARS Coronavirus 2 NEGATIVE NEGATIVE  Final    Comment: (NOTE) If result is NEGATIVE SARS-CoV-2 target nucleic acids are NOT DETECTED. The SARS-CoV-2 RNA is generally detectable in upper and lower  respiratory specimens during the acute phase of infection. The lowest  concentration of SARS-CoV-2 viral copies this assay can detect is 250  copies / mL. A negative result does not preclude SARS-CoV-2 infection  and should not be used as the sole basis for treatment or other  patient management decisions.  A negative result may occur with  improper specimen collection / handling, submission of specimen other  than nasopharyngeal swab, presence of viral mutation(s) within the  areas targeted by this assay, and inadequate number of viral copies  (<250 copies / mL). A negative result must be combined with clinical  observations, patient history, and epidemiological information. If result is POSITIVE SARS-CoV-2 target nucleic acids are DETECTED. The SARS-CoV-2 RNA is generally detectable in upper and lower  respiratory specimens dur ing the acute phase of infection.  Positive  results are indicative of active infection with SARS-CoV-2.  Clinical  correlation with patient history and other diagnostic information is  necessary to determine patient infection status.  Positive results do  not rule out bacterial infection or co-infection with other viruses. If result is PRESUMPTIVE POSTIVE SARS-CoV-2 nucleic acids MAY BE PRESENT.   A presumptive positive result was obtained on the submitted specimen  and confirmed on repeat testing.  While 2019 novel coronavirus  (SARS-CoV-2) nucleic acids may be present in the submitted sample  additional confirmatory testing may be necessary for epidemiological  and / or clinical management purposes  to differentiate between  SARS-CoV-2 and other Sarbecovirus currently known to infect humans.  If clinically indicated additional testing with an alternate test  methodology [phone]) is advised. The  SARS-CoV-2 RNA is generally  detectable in upper and lower respiratory sp ecimens during the acute  phase of infection. The expected result is Negative. Fact Sheet for Patients:  StrictlyIdeas.no Fact Sheet for Healthcare Providers: BankingDealers.co.za This test is not yet approved or cleared by the [place] FDA and has been authorized for detection and/or diagnosis of SARS-CoV-2 by  FDA under an Emergency Use Authorization (EUA).  This EUA will remain in effect (meaning this test can be used) for the duration of the COVID-19 declaration under Section 564(b)(1) of the Act, 21 U.S.C. section 360bbb-3(b)(1), unless the authorization is terminated or revoked sooner. Performed at [facility], [place] [address], [place], [place] [address]   Culture, Urine     Status: Abnormal   Collection Time: 08/07/18 10:42 AM  Result Value Ref Range Status   Specimen Description   Final    URINE, RANDOM Performed at [facility] [address] [place], [place] [address]    Special Requests   Final    NONE Performed at [facility], [place] [address] [place], [place] [address]    Culture (A)  Final    <10,000 COLONIES/mL INSIGNIFICANT GROWTH Performed at [facility] [address] [place], [place] [address]    Report Status 08/08/2018 FINAL  Final  Culture, blood (routine x 2)     Status: None (Preliminary result)   Collection Time: 08/08/18 10:38 AM  Result Value Ref Range Status   Specimen Description   Final    BLOOD LEFT ARM Performed at [facility] [address], [place], [place] [address]    Special Requests   Final    BOTTLES DRAWN AEROBIC ONLY Blood Culture results may not be optimal due to an inadequate volume of blood received in culture bottles Performed at [facility] [address], [place], [place] [address]    Culture    Final    NO GROWTH < 24 HOURS Performed at [facility] [address] [place], [place] [address]    Report Status PENDING  Incomplete  Culture, blood (routine x 2)     Status: None (Preliminary result)   Collection Time: 08/08/18 10:38 AM  Result Value Ref Range Status   Specimen Description   Final    BLOOD RIGHT ARM Performed at [facility] [address] [place], [place] [address]    Special Requests   Final    BOTTLES DRAWN AEROBIC AND ANAEROBIC Blood Culture adequate volume Performed at [facility] [address] [place], [place] [address]    Culture   Final    NO GROWTH < 24 HOURS Performed at [facility] [address], [place], [place] [address]    Report Status PENDING  Incomplete    Labs: BNP (last 3 results) No results for input(s): BNP in the last 8760 hours. Basic Metabolic Panel: Recent Labs  Lab 08/06/18 0111 08/06/18 0743 08/07/18 0412 08/08/18 0514 08/09/18 0535  NA 135 136 139 139 139  K 4.2 4.1 4.2 4.5 4.1  CL 104 110 111 113* 113*  CO2 20* 20* 20* 21* 18*  GLUCOSE 113* 98 87 99 91  BUN 46* 42* 36* 36* 34*  CREATININE 2.07* 1.81* 1.67* 1.34* 1.28*  CALCIUM 9.4 8.6* 8.6* 8.8* 8.9  MG  --   --   --  1.8 1.7  PHOS  --   --   --  2.9 3.0   Liver Function Tests: Recent Labs  Lab 08/06/18 0111 08/06/18 0743 08/07/18 0412 08/08/18 0514 08/09/18 0535  AST 51* 44* 18 47* 37  ALT 81* 67* 47* 67* 72*  ALKPHOS 253* 225* 197* 263* 281*  BILITOT 0.4 0.2* 0.4 0.3 0.3  PROT 6.5 5.7* 5.3* 5.2* 5.4*  ALBUMIN 3.0* 2.6*  2.4* 2.1* 2.3*   No results for input(s): LIPASE, AMYLASE in the last 168 hours. Recent Labs  Lab 08/06/18 0743  AMMONIA <9*   CBC: Recent Labs  Lab 08/06/18 0111 08/06/18 0743 08/07/18 0412 08/08/18 0514 08/09/18 0535  WBC 13.3* 10.4 10.6* 8.8 9.5  NEUTROABS 10.0* 8.0*  --  6.3 7.0  HGB 10.6* 8.9* 8.6* 8.5* 9.0*  HCT 32.8* 27.8* 26.7* 26.7* 28.0*  MCV 90.1 90.6  91.4 92.4 91.5  PLT 214 199 172 174 179   Cardiac Enzymes: Recent Labs  Lab 08/06/18 0743  TROPONINI 0.03*   BNP: Invalid input(s): POCBNP CBG: No results for input(s): GLUCAP in the last 168 hours. D-Dimer No results for input(s): DDIMER in the last 72 hours. Hgb A1c No results for input(s): HGBA1C in the last 72 hours. Lipid Profile No results for input(s): CHOL, HDL, LDLCALC, TRIG, CHOLHDL, LDLDIRECT in the last 72 hours. Thyroid function studies No results for input(s): TSH, T4TOTAL, T3FREE, THYROIDAB in the last 72 hours.  Invalid input(s): FREET3 Anemia work up No results for input(s): VITAMINB12, FOLATE, FERRITIN, TIBC, IRON, RETICCTPCT in the last 72 hours. Urinalysis    Component Value Date/Time   COLORURINE YELLOW 08/06/2018 0145   APPEARANCEUR CLOUDY (A) 08/06/2018 0145   LABSPEC 1.012 08/06/2018 0145   PHURINE 5.0 08/06/2018 0145   GLUCOSEU NEGATIVE 08/06/2018 0145   HGBUR SMALL (A) 08/06/2018 0145   BILIRUBINUR NEGATIVE 08/06/2018 0145   KETONESUR NEGATIVE 08/06/2018 0145   PROTEINUR 30 (A) 08/06/2018 0145   UROBILINOGEN 0.2 06/25/2011 1028   NITRITE POSITIVE (A) 08/06/2018 0145   LEUKOCYTESUR LARGE (A) 08/06/2018 0145   Sepsis Labs Invalid input(s): PROCALCITONIN,  WBC,  LACTICIDVEN Microbiology Recent Results (from the past 240 hour(s))  SARS Coronavirus 2 (CEPHEID - Performed in [place] hospital lab), Hosp Order     Status: None   Collection Time: 08/06/18  4:31 AM  Result Value Ref Range Status   SARS Coronavirus 2 NEGATIVE NEGATIVE Final    Comment: (NOTE) If result is NEGATIVE SARS-CoV-2 target nucleic acids are NOT DETECTED. The SARS-CoV-2 RNA is generally detectable in upper and lower  respiratory specimens during the acute phase of infection. The lowest  concentration of SARS-CoV-2 viral copies this assay can detect is 250  copies / mL. A negative result does not preclude SARS-CoV-2 infection  and should not be used as the sole basis  for treatment or other  patient management decisions.  A negative result may occur with  improper specimen collection / handling, submission of specimen other  than nasopharyngeal swab, presence of viral mutation(s) within the  areas targeted by this assay, and inadequate number of viral copies  (<250 copies / mL). A negative result must be combined with clinical  observations, patient history, and epidemiological information. If result is POSITIVE SARS-CoV-2 target nucleic acids are DETECTED. The SARS-CoV-2 RNA is generally detectable in upper and lower  respiratory specimens dur ing the acute phase of infection.  Positive  results are indicative of active infection with SARS-CoV-2.  Clinical  correlation with patient history and other diagnostic information is  necessary to determine patient infection status.  Positive results do  not rule out bacterial infection or co-infection with other viruses. If result is PRESUMPTIVE POSTIVE SARS-CoV-2 nucleic acids MAY BE PRESENT.   A presumptive positive result was obtained on the submitted specimen  and confirmed on repeat testing.  While 2019 novel coronavirus  (SARS-CoV-2) nucleic acids may be present in the submitted sample  additional confirmatory testing may be necessary for epidemiological  and / or clinical management purposes  to differentiate between  SARS-CoV-2 and other Sarbecovirus currently known to infect humans.  If clinically indicated additional testing with an alternate test  methodology [id number]) is advised. The SARS-CoV-2 RNA is generally  detectable in upper and lower respiratory sp ecimens during the acute  phase of infection. The expected result is Negative. Fact Sheet for Patients:  StrictlyIdeas.no Fact Sheet for Healthcare Providers: BankingDealers.co.za This test is not yet approved or cleared by the [place] FDA and has been authorized for detection and/or  diagnosis of SARS-CoV-2 by FDA under an Emergency Use Authorization (EUA).  This EUA will remain in effect (meaning this test can be used) for the duration of the COVID-19 declaration under Section 564(b)(1) of the Act, 21 U.S.C. section 360bbb-3(b)(1), unless the authorization is terminated or revoked sooner. Performed at [facility], [place] [address] [place], [place] [address]   Culture, Urine     Status: Abnormal   Collection Time: 08/07/18 10:42 AM  Result Value Ref Range Status   Specimen Description   Final    URINE, RANDOM Performed at [facility] [address], [place], [place] [address]    Special Requests   Final    NONE Performed at [facility], [place] [address] [place], [place] [address]    Culture (A)  Final    <10,000 COLONIES/mL INSIGNIFICANT GROWTH Performed at [facility] [address] [place], [place] [address]    Report Status 08/08/2018 FINAL  Final  Culture, blood (routine x 2)     Status: None (Preliminary result)   Collection Time: 08/08/18 10:38 AM  Result Value Ref Range Status   Specimen Description   Final    BLOOD LEFT ARM Performed at [place] [address], [place], [place] [address]    Special Requests   Final    BOTTLES DRAWN AEROBIC ONLY Blood Culture results may not be optimal due to an inadequate volume of blood received in culture bottles Performed at [facility] [address] [place], [place] [address]    Culture   Final    NO GROWTH < 24 HOURS Performed at [facility] [address] [place], [place] [address]    Report Status PENDING  Incomplete  Culture, blood (routine x 2)     Status: None (Preliminary result)   Collection Time: 08/08/18 10:38 AM  Result Value Ref Range Status   Specimen Description   Final    BLOOD RIGHT ARM Performed at [facility] [address], [place], [place]  [address]    Special Requests   Final    BOTTLES DRAWN AEROBIC AND ANAEROBIC Blood Culture adequate volume Performed at [facility] [address], [place], [place] [address]    Culture   Final    NO GROWTH < 24 HOURS Performed at [facility] [address] [place], [place] [address]    Report Status PENDING  Incomplete   Time coordinating discharge: 35 minutes  SIGNED:  Kerney Elbe, DO [facility] 08/09/2018, 2:01 PM Pager is on [place]  If 7PM-7AM, please contact night-coverage [email or web address] Password [email or web address]

## 2018-08-09 NOTE — Discharge Instructions (Signed)
Urinary Tract Infection, Adult A urinary tract infection (UTI) is an infection of any part of the urinary tract. The urinary tract includes:  The kidneys.  The ureters.  The bladder.  The urethra. These organs make, store, and get rid of pee (urine) in the body. What are the causes? This is caused by germs (bacteria) in your genital area. These germs grow and cause swelling (inflammation) of your urinary tract. What increases the risk? You are more likely to develop this condition if:  You have a small, thin tube (catheter) to drain pee.  You cannot control when you pee or poop (incontinence).  You are female, and: ? You use these methods to prevent pregnancy: ? A medicine that kills sperm (spermicide). ? A device that blocks sperm (diaphragm). ? You have low levels of a female hormone (estrogen). ? You are pregnant.  You have genes that add to your risk.  You are sexually active.  You take antibiotic medicines.  You have trouble peeing because of: ? A prostate that is bigger than normal, if you are female. ? A blockage in the part of your body that drains pee from the bladder (urethra). ? A kidney stone. ? A nerve condition that affects your bladder (neurogenic bladder). ? Not getting enough to drink. ? Not peeing often enough.  You have other conditions, such as: ? Diabetes. ? A weak disease-fighting system (immune system). ? Sickle cell disease. ? Gout. ? Injury of the spine. What are the signs or symptoms? Symptoms of this condition include:  Needing to pee right away (urgently).  Peeing often.  Peeing small amounts often.  Pain or burning when peeing.  Blood in the pee.  Pee that smells bad or not like normal.  Trouble peeing.  Pee that is cloudy.  Fluid coming from the vagina, if you are female.  Pain in the belly or lower back. Other symptoms include:  Throwing up (vomiting).  No urge to eat.  Feeling mixed up (confused).  Being tired  and grouchy (irritable).  A fever.  Watery poop (diarrhea). How is this treated? This condition may be treated with:  Antibiotic medicine.  Other medicines.  Drinking enough water. Follow these instructions at home:  Medicines  Take over-the-counter and prescription medicines only as told by your doctor.  If you were prescribed an antibiotic medicine, take it as told by your doctor. Do not stop taking it even if you start to feel better. General instructions  Make sure you: ? Pee until your bladder is empty. ? Do not hold pee for a long time. ? Empty your bladder after sex. ? Wipe from front to back after pooping if you are a female. Use each tissue one time when you wipe.  Drink enough fluid to keep your pee pale yellow.  Keep all follow-up visits as told by your doctor. This is important. Contact a doctor if:  You do not get better after 1-2 days.  Your symptoms go away and then come back. Get help right away if:  You have very bad back pain.  You have very bad pain in your lower belly.  You have a fever.  You are sick to your stomach (nauseous).  You are throwing up. Summary  A urinary tract infection (UTI) is an infection of any part of the urinary tract.  This condition is caused by germs in your genital area.  There are many risk factors for a UTI. These include having a small, thin   tube to drain pee and not being able to control when you pee or poop.  Treatment includes antibiotic medicines for germs.  Drink enough fluid to keep your pee pale yellow. This information is not intended to replace advice given to you by your health care provider. Make sure you discuss any questions you have with your health care provider. Document Released: 08/23/2007 Document Revised: 09/13/2017 Document Reviewed: 09/13/2017 Elsevier Interactive Patient Education  2019 Elsevier Inc.  

## 2018-08-13 DIAGNOSIS — R531 Weakness: Secondary | ICD-10-CM | POA: Diagnosis not present

## 2018-08-13 DIAGNOSIS — N178 Other acute kidney failure: Secondary | ICD-10-CM | POA: Diagnosis not present

## 2018-08-13 DIAGNOSIS — N390 Urinary tract infection, site not specified: Secondary | ICD-10-CM | POA: Diagnosis not present

## 2018-08-13 DIAGNOSIS — I10 Essential (primary) hypertension: Secondary | ICD-10-CM | POA: Diagnosis not present

## 2018-08-13 LAB — CULTURE, BLOOD (ROUTINE X 2)
Culture: NO GROWTH
Culture: NO GROWTH
Special Requests: ADEQUATE

## 2018-08-16 DIAGNOSIS — N1330 Unspecified hydronephrosis: Secondary | ICD-10-CM | POA: Diagnosis not present

## 2018-08-16 DIAGNOSIS — N12 Tubulo-interstitial nephritis, not specified as acute or chronic: Secondary | ICD-10-CM | POA: Diagnosis not present

## 2018-08-16 DIAGNOSIS — F0390 Unspecified dementia without behavioral disturbance: Secondary | ICD-10-CM | POA: Diagnosis not present

## 2018-08-16 DIAGNOSIS — N183 Chronic kidney disease, stage 3 (moderate): Secondary | ICD-10-CM | POA: Diagnosis not present

## 2018-08-16 DIAGNOSIS — N390 Urinary tract infection, site not specified: Secondary | ICD-10-CM | POA: Diagnosis not present

## 2018-08-16 DIAGNOSIS — G9341 Metabolic encephalopathy: Secondary | ICD-10-CM | POA: Diagnosis not present

## 2018-08-16 DIAGNOSIS — N179 Acute kidney failure, unspecified: Secondary | ICD-10-CM | POA: Diagnosis not present

## 2018-08-19 DIAGNOSIS — R945 Abnormal results of liver function studies: Secondary | ICD-10-CM | POA: Diagnosis not present

## 2018-08-19 DIAGNOSIS — N183 Chronic kidney disease, stage 3 (moderate): Secondary | ICD-10-CM | POA: Diagnosis not present

## 2018-08-19 DIAGNOSIS — R531 Weakness: Secondary | ICD-10-CM | POA: Diagnosis not present

## 2018-08-19 DIAGNOSIS — I10 Essential (primary) hypertension: Secondary | ICD-10-CM | POA: Diagnosis not present

## 2018-08-27 DIAGNOSIS — F0390 Unspecified dementia without behavioral disturbance: Secondary | ICD-10-CM | POA: Diagnosis not present

## 2018-08-27 DIAGNOSIS — R531 Weakness: Secondary | ICD-10-CM | POA: Diagnosis not present

## 2018-08-27 DIAGNOSIS — N390 Urinary tract infection, site not specified: Secondary | ICD-10-CM | POA: Diagnosis not present

## 2018-08-27 DIAGNOSIS — Z09 Encounter for follow-up examination after completed treatment for conditions other than malignant neoplasm: Secondary | ICD-10-CM | POA: Diagnosis not present

## 2018-08-27 DIAGNOSIS — G9340 Encephalopathy, unspecified: Secondary | ICD-10-CM | POA: Diagnosis not present

## 2018-08-27 DIAGNOSIS — Z833 Family history of diabetes mellitus: Secondary | ICD-10-CM | POA: Diagnosis not present

## 2018-09-19 DIAGNOSIS — E782 Mixed hyperlipidemia: Secondary | ICD-10-CM | POA: Diagnosis not present

## 2018-09-19 DIAGNOSIS — N390 Urinary tract infection, site not specified: Secondary | ICD-10-CM | POA: Diagnosis not present

## 2018-09-26 DIAGNOSIS — I10 Essential (primary) hypertension: Secondary | ICD-10-CM | POA: Diagnosis not present

## 2018-09-26 DIAGNOSIS — Z7189 Other specified counseling: Secondary | ICD-10-CM | POA: Diagnosis not present

## 2018-09-26 DIAGNOSIS — F0390 Unspecified dementia without behavioral disturbance: Secondary | ICD-10-CM | POA: Diagnosis not present

## 2018-09-26 DIAGNOSIS — E782 Mixed hyperlipidemia: Secondary | ICD-10-CM | POA: Diagnosis not present

## 2018-10-01 DIAGNOSIS — N130 Hydronephrosis with ureteropelvic junction obstruction: Secondary | ICD-10-CM | POA: Diagnosis not present

## 2018-10-01 DIAGNOSIS — N3020 Other chronic cystitis without hematuria: Secondary | ICD-10-CM | POA: Diagnosis not present

## 2018-11-18 ENCOUNTER — Other Ambulatory Visit: Payer: Self-pay

## 2018-11-18 ENCOUNTER — Inpatient Hospital Stay (HOSPITAL_COMMUNITY)
Admission: EM | Admit: 2018-11-18 | Discharge: 2018-11-21 | DRG: 689 | Disposition: A | Discharge status: Home / Self Care | Payer: Medicare HMO | Attending: Internal Medicine | Admitting: Internal Medicine

## 2018-11-18 ENCOUNTER — Encounter (HOSPITAL_COMMUNITY): Payer: Self-pay

## 2018-11-18 ENCOUNTER — Inpatient Hospital Stay (HOSPITAL_COMMUNITY): Payer: Medicare HMO

## 2018-11-18 DIAGNOSIS — Z905 Acquired absence of kidney: Secondary | ICD-10-CM | POA: Diagnosis not present

## 2018-11-18 DIAGNOSIS — E871 Hypo-osmolality and hyponatremia: Secondary | ICD-10-CM | POA: Diagnosis not present

## 2018-11-18 DIAGNOSIS — Z85828 Personal history of other malignant neoplasm of skin: Secondary | ICD-10-CM

## 2018-11-18 DIAGNOSIS — E872 Acidosis: Secondary | ICD-10-CM | POA: Diagnosis not present

## 2018-11-18 DIAGNOSIS — N179 Acute kidney failure, unspecified: Secondary | ICD-10-CM | POA: Diagnosis present

## 2018-11-18 DIAGNOSIS — R270 Ataxia, unspecified: Secondary | ICD-10-CM

## 2018-11-18 DIAGNOSIS — G9341 Metabolic encephalopathy: Secondary | ICD-10-CM | POA: Diagnosis not present

## 2018-11-18 DIAGNOSIS — F0390 Unspecified dementia without behavioral disturbance: Secondary | ICD-10-CM | POA: Diagnosis not present

## 2018-11-18 DIAGNOSIS — Z86010 Personal history of colonic polyps: Secondary | ICD-10-CM

## 2018-11-18 DIAGNOSIS — Z923 Personal history of irradiation: Secondary | ICD-10-CM | POA: Diagnosis not present

## 2018-11-18 DIAGNOSIS — Z79899 Other long term (current) drug therapy: Secondary | ICD-10-CM

## 2018-11-18 DIAGNOSIS — Z85048 Personal history of other malignant neoplasm of rectum, rectosigmoid junction, and anus: Secondary | ICD-10-CM

## 2018-11-18 DIAGNOSIS — R32 Unspecified urinary incontinence: Secondary | ICD-10-CM | POA: Diagnosis not present

## 2018-11-18 DIAGNOSIS — Z7982 Long term (current) use of aspirin: Secondary | ICD-10-CM | POA: Diagnosis not present

## 2018-11-18 DIAGNOSIS — Z20828 Contact with and (suspected) exposure to other viral communicable diseases: Secondary | ICD-10-CM | POA: Diagnosis not present

## 2018-11-18 DIAGNOSIS — Z9221 Personal history of antineoplastic chemotherapy: Secondary | ICD-10-CM

## 2018-11-18 DIAGNOSIS — N390 Urinary tract infection, site not specified: Secondary | ICD-10-CM | POA: Diagnosis not present

## 2018-11-18 DIAGNOSIS — N12 Tubulo-interstitial nephritis, not specified as acute or chronic: Principal | ICD-10-CM | POA: Diagnosis present

## 2018-11-18 DIAGNOSIS — R5381 Other malaise: Secondary | ICD-10-CM | POA: Diagnosis not present

## 2018-11-18 DIAGNOSIS — I679 Cerebrovascular disease, unspecified: Secondary | ICD-10-CM | POA: Diagnosis not present

## 2018-11-18 DIAGNOSIS — R531 Weakness: Secondary | ICD-10-CM

## 2018-11-18 DIAGNOSIS — I10 Essential (primary) hypertension: Secondary | ICD-10-CM | POA: Diagnosis not present

## 2018-11-18 DIAGNOSIS — R21 Rash and other nonspecific skin eruption: Secondary | ICD-10-CM | POA: Diagnosis present

## 2018-11-18 DIAGNOSIS — G319 Degenerative disease of nervous system, unspecified: Secondary | ICD-10-CM | POA: Diagnosis not present

## 2018-11-18 DIAGNOSIS — N3000 Acute cystitis without hematuria: Secondary | ICD-10-CM | POA: Diagnosis not present

## 2018-11-18 LAB — CBC WITH DIFFERENTIAL/PLATELET
Abs Immature Granulocytes: 0.08 10*3/uL — ABNORMAL HIGH (ref 0.00–0.07)
Basophils Absolute: 0 10*3/uL (ref 0.0–0.1)
Basophils Relative: 0 %
Eosinophils Absolute: 0.3 10*3/uL (ref 0.0–0.5)
Eosinophils Relative: 4 %
HCT: 32.3 % — ABNORMAL LOW (ref 36.0–46.0)
Hemoglobin: 11.1 g/dL — ABNORMAL LOW (ref 12.0–15.0)
Immature Granulocytes: 1 %
Lymphocytes Relative: 17 %
Lymphs Abs: 1.3 10*3/uL (ref 0.7–4.0)
MCH: 30.6 pg (ref 26.0–34.0)
MCHC: 34.4 g/dL (ref 30.0–36.0)
MCV: 89 fL (ref 80.0–100.0)
Monocytes Absolute: 0.7 10*3/uL (ref 0.1–1.0)
Monocytes Relative: 9 %
Neutro Abs: 5.3 10*3/uL (ref 1.7–7.7)
Neutrophils Relative %: 69 %
Platelets: 173 10*3/uL (ref 150–400)
RBC: 3.63 MIL/uL — ABNORMAL LOW (ref 3.87–5.11)
RDW: 15.7 % — ABNORMAL HIGH (ref 11.5–15.5)
WBC: 7.6 10*3/uL (ref 4.0–10.5)
nRBC: 0 % (ref 0.0–0.2)

## 2018-11-18 LAB — SARS CORONAVIRUS 2 BY RT PCR (HOSPITAL ORDER, PERFORMED IN ~~LOC~~ HOSPITAL LAB): SARS Coronavirus 2: NEGATIVE

## 2018-11-18 LAB — URINALYSIS, ROUTINE W REFLEX MICROSCOPIC
Bilirubin Urine: NEGATIVE
Glucose, UA: NEGATIVE mg/dL
Ketones, ur: NEGATIVE mg/dL
Nitrite: NEGATIVE
Protein, ur: 30 mg/dL — AB
RBC / HPF: >50 RBC/hpf — ABNORMAL HIGH (ref 0–5)
Specific Gravity, Urine: 1.013 (ref 1.005–1.030)
pH: 6 (ref 5.0–8.0)

## 2018-11-18 LAB — COMPREHENSIVE METABOLIC PANEL
ALT: 26 U/L (ref 0–44)
AST: 19 U/L (ref 15–41)
Albumin: 3.5 g/dL (ref 3.5–5.0)
Alkaline Phosphatase: 79 U/L (ref 38–126)
Anion gap: 9 (ref 5–15)
BUN: 55 mg/dL — ABNORMAL HIGH (ref 8–23)
CO2: 22 mmol/L (ref 22–32)
Calcium: 10.1 mg/dL (ref 8.9–10.3)
Chloride: 102 mmol/L (ref 98–111)
Creatinine, Ser: 2.09 mg/dL — ABNORMAL HIGH (ref 0.44–1.00)
GFR calc Af Amer: 25 mL/min — ABNORMAL LOW (ref >60)
GFR calc non Af Amer: 21 mL/min — ABNORMAL LOW (ref >60)
Glucose, Bld: 101 mg/dL — ABNORMAL HIGH (ref 70–99)
Potassium: 4.4 mmol/L (ref 3.5–5.1)
Sodium: 133 mmol/L — ABNORMAL LOW (ref 135–145)
Total Bilirubin: 0.5 mg/dL (ref 0.3–1.2)
Total Protein: 6.5 g/dL (ref 6.5–8.1)

## 2018-11-18 MED ORDER — SODIUM CHLORIDE 0.9 % IV SOLN
1.0000 g | INTRAVENOUS | Status: DC
  Administered 2018-11-19 – 2018-11-20 (×2): 1 g via INTRAVENOUS
  Filled 2018-11-18 (×2): qty 1
  Filled 2018-11-18: qty 10

## 2018-11-18 MED ORDER — ENOXAPARIN SODIUM 30 MG/0.3ML ~~LOC~~ SOLN
30.0000 mg | SUBCUTANEOUS | Status: DC
  Administered 2018-11-18 – 2018-11-20 (×3): 30 mg via SUBCUTANEOUS
  Filled 2018-11-18 (×3): qty 0.3

## 2018-11-18 MED ORDER — ASPIRIN EC 81 MG PO TBEC
81.0000 mg | DELAYED_RELEASE_TABLET | Freq: Every day | ORAL | Status: DC
  Administered 2018-11-19 – 2018-11-21 (×3): 81 mg via ORAL
  Filled 2018-11-18 (×3): qty 1

## 2018-11-18 MED ORDER — SODIUM CHLORIDE 0.9 % IV SOLN
INTRAVENOUS | Status: DC
  Administered 2018-11-18 – 2018-11-19 (×2): via INTRAVENOUS

## 2018-11-18 MED ORDER — SODIUM CHLORIDE 0.9 % IV BOLUS
500.0000 mL | Freq: Once | INTRAVENOUS | Status: AC
  Administered 2018-11-18: 500 mL via INTRAVENOUS

## 2018-11-18 MED ORDER — MEMANTINE HCL 10 MG PO TABS
10.0000 mg | ORAL_TABLET | Freq: Two times a day (BID) | ORAL | Status: DC
  Administered 2018-11-18 – 2018-11-21 (×6): 10 mg via ORAL
  Filled 2018-11-18 (×6): qty 1

## 2018-11-18 MED ORDER — ACETAMINOPHEN 325 MG PO TABS
650.0000 mg | ORAL_TABLET | Freq: Four times a day (QID) | ORAL | Status: DC | PRN
  Administered 2018-11-18 – 2018-11-21 (×3): 650 mg via ORAL
  Filled 2018-11-18 (×3): qty 2

## 2018-11-18 MED ORDER — VITAMIN B-12 1000 MCG PO TABS
1000.0000 ug | ORAL_TABLET | Freq: Every day | ORAL | Status: DC
  Administered 2018-11-19 – 2018-11-21 (×3): 1000 ug via ORAL
  Filled 2018-11-18 (×3): qty 1

## 2018-11-18 MED ORDER — VENLAFAXINE HCL ER 75 MG PO CP24
75.0000 mg | ORAL_CAPSULE | Freq: Every day | ORAL | Status: DC
  Administered 2018-11-19 – 2018-11-21 (×3): 75 mg via ORAL
  Filled 2018-11-18 (×3): qty 1

## 2018-11-18 MED ORDER — SODIUM CHLORIDE 0.9 % IV SOLN
1.0000 g | Freq: Once | INTRAVENOUS | Status: AC
  Administered 2018-11-18: 13:00:00 1 g via INTRAVENOUS
  Filled 2018-11-18: qty 10

## 2018-11-18 MED ORDER — TRIMETHOPRIM 100 MG PO TABS
100.0000 mg | ORAL_TABLET | Freq: Every day | ORAL | Status: DC
  Administered 2018-11-19 – 2018-11-20 (×2): 100 mg via ORAL
  Filled 2018-11-18 (×3): qty 1

## 2018-11-18 MED ORDER — OXYBUTYNIN CHLORIDE 5 MG PO TABS
5.0000 mg | ORAL_TABLET | Freq: Two times a day (BID) | ORAL | Status: DC
  Administered 2018-11-18 – 2018-11-21 (×6): 5 mg via ORAL
  Filled 2018-11-18 (×6): qty 1

## 2018-11-18 MED ORDER — DONEPEZIL HCL 10 MG PO TABS
10.0000 mg | ORAL_TABLET | Freq: Every day | ORAL | Status: DC
  Administered 2018-11-18 – 2018-11-20 (×3): 10 mg via ORAL
  Filled 2018-11-18 (×3): qty 1

## 2018-11-18 NOTE — ED Provider Notes (Addendum)
[facility] DEPT Provider Note   CSN: [id number] Arrival date & time: 11/18/18  1130     History   Chief Complaint Chief Complaint  Patient presents with  . Urinary Tract Infection    HPI [facility] is a 85 y.o. female.     HPI  Level 5 caveat for severe dementia.  85 year old female comes in a chief complaint of weakness.  Patient has dementia. According to patient's friend, who is also listed as legal guardian, patient started getting worse on Thursday.  She started having weakness and increased confusion and poor balance.  Patient had similar symptoms with her prior UTI and they started her on Augmentin.  Despite being on Augmentin through the weekend, patient symptoms have progressed and today they were unable to get her up from the bed.  There has not been any associated nausea, vomiting, fevers, chills, diarrhea.  Patient normally walks with a walker.  She was admitted for similar complaints few weeks ago and found to have a UTI.  Additionally, family reports that patient was incontinent and the urine was foul-smelling.  Past Medical History:  Diagnosis Date  . Anal cancer ([place]) 2004   Non-surgical mgt with chemo-radiation.  Dr Alen Blew follows.   . Anal fissure   . Colon, diverticulosis    Sigmoid. With associated stenosis.  Colonoscopy failed to intubate beyond stenosis in 08/2010.  rectal bx then negative for recurrent cancer.   . Dementia ([place])   . Hypertension   . OA (osteoarthritis)   . Squamous cell carcinoma    skin  . Varicose veins of left lower extremity with pain     Patient Active Problem List   Diagnosis Date Noted  . Acute encephalopathy 08/06/2018  . Pyelonephritis 08/06/2018  . ARF (acute renal failure) ([place]) 08/06/2018  . Hydronephrosis 08/06/2018  . UTI (urinary tract infection) 07/06/2018  . Dementia ([place])   . Spinal stenosis, lumbar region with neurogenic claudication 07/26/2016  . Pseudoaphakia 11/13/2011   . Nonspecific (abnormal) findings on radiological and other examination of gastrointestinal tract 06/26/2011  . Colitis 06/25/2011  . Rectal bleeding 06/25/2011  . History of rectal or anal cancer 06/25/2011  . HTN (hypertension) 06/25/2011    Past Surgical History:  Procedure Laterality Date  . ABDOMINAL HYSTERECTOMY    . APPENDECTOMY    . BLADDER REPAIR    . CATARACT EXTRACTION  05/2004    dr Ishmael Holter  . LUMBAR LAMINECTOMY/DECOMPRESSION MICRODISCECTOMY N/A 07/26/2016   Procedure: Central decompression, lumbar laminectomy L4-L5 and L5-S1, foraminotmy L5-S1 and L4-L5 bilateral;  Surgeon: Latanya Maudlin, MD;  Location: [facility]S;  Service: Orthopedics;  Laterality: N/A;  . NEPHRECTOMY  1970's   Rt. "Shrivelled up" ,  no cancer.   Marland Kitchen POLYPECTOMY       OB History   No obstetric history on file.      Home Medications    Prior to Admission medications   Medication Sig Start Date End Date Taking? Authorizing Provider  acetaminophen (TYLENOL) 325 MG tablet Take 650 mg by mouth every 6 (six) hours as needed for mild pain.     [provider]  aspirin EC 81 MG tablet Take 81 mg by mouth daily.    [provider]  donepezil (ARICEPT) 10 MG tablet Take 10 mg by mouth at bedtime.  09/25/14   [provider]  feeding supplement, ENSURE ENLIVE, (ENSURE ENLIVE) LIQD Take 237 mLs by mouth 2 (two) times daily between meals. 08/10/18  Sheikh, Omair Latif, DO  memantine (NAMENDA) 10 MG tablet Take 10 mg by mouth 2 (two) times daily. 04/20/16   [provider]  Misc Natural Products (OSTEO BI-FLEX ADV JOINT SHIELD PO) Take 1 tablet by mouth daily.      [provider]  Multiple Vitamin (MULITIVITAMIN WITH MINERALS) TABS Take 1 tablet by mouth daily.    [provider]  Omega-3 Fatty Acids (FISH OIL) 1000 MG CAPS Take 1 capsule by mouth daily.      [provider]  oxybutynin (DITROPAN) 5 MG tablet Take 5 mg by mouth 2 (two) times daily. 09/08/14    [provider]  sodium bicarbonate 650 MG tablet Take 1 tablet (650 mg total) by mouth daily. 08/09/18   Raiford Noble Latif, DO  venlafaxine XR (EFFEXOR-XR) 75 MG 24 hr capsule Take 75 mg by mouth daily with breakfast.    [provider]  vitamin C (ASCORBIC ACID) 500 MG tablet Take 500 mg by mouth daily.      [provider]  vitamin E 200 UNIT capsule Take 200 Units by mouth 3 (three) times daily.      [provider]    Family History Family History  Problem Relation Age of Onset  . Coronary artery disease Father   . Leukemia Brother   . Colon cancer Brother   . Breast cancer Sister   . Ovarian cancer Sister   . Diabetes Daughter     Social History Social History   Tobacco Use  . Smoking status: Never Smoker  . Smokeless tobacco: Never Used  Substance Use Topics  . Alcohol use: No  . Drug use: No     Allergies   Patient has no known allergies.   Review of Systems Review of Systems  Unable to perform ROS: Dementia     Physical Exam Updated Vital Signs BP (!) 110/52   Pulse 73   Temp 98.8 F (37.1 C) (Oral)   Resp (!) 24   SpO2 96%   Physical Exam Constitutional:      General: She is not in acute distress.    Appearance: She is well-developed.  HENT:     Head: Normocephalic and atraumatic.  Eyes:     Pupils: Pupils are equal, round, and reactive to light.  Cardiovascular:     Rate and Rhythm: Normal rate and regular rhythm.     Heart sounds: Normal heart sounds.  Pulmonary:     Effort: Pulmonary effort is normal. No respiratory distress.  Abdominal:     General: There is no distension.     Palpations: Abdomen is soft.     Tenderness: There is no abdominal tenderness. There is no guarding.  Skin:    General: Skin is warm and dry.      ED Treatments / Results  Labs (all labs ordered are listed, but only abnormal results are displayed) Labs Reviewed  COMPREHENSIVE METABOLIC PANEL - Abnormal; Notable for  the following components:      Result Value   Sodium 133 (*)    Glucose, Bld 101 (*)    BUN 55 (*)    Creatinine, Ser 2.09 (*)    GFR calc non Af Amer 21 (*)    GFR calc Af Amer 25 (*)    All other components within normal limits  CBC WITH DIFFERENTIAL/PLATELET - Abnormal; Notable for the following components:   RBC 3.63 (*)    Hemoglobin 11.1 (*)    HCT 32.3 (*)  RDW 15.7 (*)    Abs Immature Granulocytes 0.08 (*)    All other components within normal limits  URINALYSIS, ROUTINE W REFLEX MICROSCOPIC - Abnormal; Notable for the following components:   Color, Urine RED (*)    APPearance CLOUDY (*)    Hgb urine dipstick LARGE (*)    Protein, ur 30 (*)    Leukocytes,Ua LARGE (*)    RBC / HPF >50 (*)    Bacteria, UA RARE (*)    All other components within normal limits  URINE CULTURE  SARS CORONAVIRUS 2 (TAT 6-24 HRS)    EKG None  Radiology No results found.  Procedures Procedures (including critical care time)  Medications Ordered in ED Medications  cefTRIAXone (ROCEPHIN) 1 g in sodium chloride 0.9 % 100 mL IVPB (has no administration in time range)  sodium chloride 0.9 % bolus 500 mL (500 mLs Intravenous New Bag/Given 11/18/18 1227)     Initial Impression / Assessment and Plan / ED Course  I have reviewed the triage vital signs and the nursing notes.  Pertinent labs & imaging results that were available during my care of the patient were reviewed by me and considered in my medical decision making (see chart for details).  Clinical Course as of Nov 17 1316  Mon Nov 18, 2018  1316 UA has positive leukocytes, bacteriuria and pyuria. She likely has underlying UTI, given that she has had similar presentation in the past with her UTI.  Antibiotics to be started.  Clinically, patient is not septic.  Urinalysis, Routine w reflex microscopic(!) [AN]  1316 Patient is noted to have AKI as well.  Likely prerenal.  Hydration started.  Creatinine(!): 2.09 [AN]    Clinical  Course User Index [AN] Varney Biles, MD       85 year old comes in a chief complaint of weakness.  Patient is demented and not providing meaningful history. On exam she has no focal tenderness or discomfort.  Family reports that patient is having incontinence, ataxia and that she had similar symptoms in the past with UTI.  Differential diagnosis includes UTI, but additionally we have also considered normal pressure hydrocephalus in the differential as well.  For now the plan is to get basic labs and UA. She failed outpatient therapy with Augmentin, therefore there is any UTI she will be admitted.  We also attempted to walk patient in the ED, and she was barely able to stand up.  Final Clinical Impressions(s) / ED Diagnoses   Final diagnoses:  AKI (acute kidney injury) [facility])  Pyelonephritis    ED Discharge Orders    None       Varney Biles, MD 11/18/18 [place], [place], MD 11/27/18 Vernelle Emerald

## 2018-11-18 NOTE — H&P (Addendum)
History and Physical    Shannon Blair [id number] DOB: 1931/07/05 DOA: 11/18/2018  PCP: Merrilee Seashore, MD   Patient coming from: Home  I have personally briefly reviewed patient's old medical records in [place]  Chief Complaint: Generalized weakness and fatigue and increasing confusion since 2 days  HPI: Shannon Blair is a 85 y.o. female with medical history significant of dementia, hypertension, OA, diverticulosis, was brought in by family for worsening weakness, fatigue, urinary incontinence and increasing confusion since 2 days patient has dementia at baseline but family reports she is more confused than baseline.  No history of fevers or chills.  Patient is a poor historian is alert and oriented to person only on my exam.  Most of the history is obtained from the ED note and family.  No history of nausea or vomiting or diarrhea.  Patient denies any chest pain or shortness of breath.  ED Course: On arrival to ED she was afebrile slightly tachypneic blood pressure of 140/48. Labs reveal sodium of 133, creatinine of 2.09, BUN of 55, hemoglobin of 11.1.  COVID-19 screening is pending, urine analysis shows large leukocytes and bacteria.  Urine cultures are sent and pending head without contrast does not show any acute intracranial abnormalities.  She was given a fluid bolus normal saline 1 L and was started on Rocephin and was referred to [facility] for admission for evaluation of acute encephalopathy and urinary tract infection.  Review of Systems: As per HPI  "All others reviewed and are negative," .   Past Medical History:  Diagnosis Date  . Anal cancer ([place]) 2004   Non-surgical mgt with chemo-radiation.  Dr Alen Blew follows.   . Anal fissure   . Colon, diverticulosis    Sigmoid. With associated stenosis.  Colonoscopy failed to intubate beyond stenosis in 08/2010.  rectal bx then negative for recurrent cancer.   . Dementia ([place])   . Hypertension   . OA (osteoarthritis)    . Squamous cell carcinoma    skin  . Varicose veins of left lower extremity with pain     Past Surgical History:  Procedure Laterality Date  . ABDOMINAL HYSTERECTOMY    . APPENDECTOMY    . BLADDER REPAIR    . CATARACT EXTRACTION  05/2004    dr Ishmael Holter  . LUMBAR LAMINECTOMY/DECOMPRESSION MICRODISCECTOMY N/A 07/26/2016   Procedure: Central decompression, lumbar laminectomy L4-L5 and L5-S1, foraminotmy L5-S1 and L4-L5 bilateral;  Surgeon: Latanya Maudlin, MD;  Location: [facility];  Service: Orthopedics;  Laterality: N/A;  . NEPHRECTOMY  1970's   Rt. "Shrivelled up" ,  no cancer.   Marland Kitchen POLYPECTOMY     Social history  reports that she has never smoked. She has never used smokeless tobacco. She reports that she does not drink alcohol or use drugs.  No Known Allergies  Family History  Problem Relation Age of Onset  . Coronary artery disease Father   . Leukemia Brother   . Colon cancer Brother   . Breast cancer Sister   . Ovarian cancer Sister   . Diabetes Daughter     Family history reviewed and not pertinent   Prior to Admission medications   Medication Sig Start Date End Date Taking? Authorizing Provider  acetaminophen (TYLENOL) 325 MG tablet Take 650 mg by mouth every 6 (six) hours as needed for mild pain.    Yes [provider]  amoxicillin-clavulanate (AUGMENTIN) 875-125 MG tablet Take 1 tablet by mouth 2 (two) times daily. 7 day supply  Yes [provider]  aspirin EC 81 MG tablet Take 81 mg by mouth daily.   Yes [provider]  Cholecalciferol (VITAMIN D3) 50 MCG (2000 UT) capsule Take 2,000 Units by mouth daily.   Yes [provider]  docusate sodium (COLACE) 100 MG capsule Take 100 mg by mouth at bedtime.   Yes [provider]  donepezil (ARICEPT) 10 MG tablet Take 10 mg by mouth at bedtime.  09/25/14  Yes [provider]  ibuprofen (ADVIL) 200 MG tablet Take 400 mg by mouth every 6 (six) hours as needed for fever, headache or  moderate pain.   Yes [provider]  irbesartan-hydrochlorothiazide (AVALIDE) 300-12.5 MG tablet Take 1 tablet by mouth daily. 09/21/18  Yes [provider]  memantine (NAMENDA) 10 MG tablet Take 10 mg by mouth 2 (two) times daily. 04/20/16  Yes [provider]  Multiple Vitamin (MULITIVITAMIN WITH MINERALS) TABS Take 1 tablet by mouth daily.   Yes [provider]  Omega-3 Fatty Acids (FISH OIL) 1000 MG CAPS Take 1 capsule by mouth daily.     Yes [provider]  oxybutynin (DITROPAN) 5 MG tablet Take 5 mg by mouth 2 (two) times daily. 09/08/14  Yes [provider]  Red Yeast Rice 600 MG TABS Take 600 mg by mouth daily.   Yes [provider]  trimethoprim (TRIMPEX) 100 MG tablet Take 100 mg by mouth daily. 10/02/18  Yes [provider]  venlafaxine XR (EFFEXOR-XR) 75 MG 24 hr capsule Take 75 mg by mouth daily with breakfast.   Yes [provider]  vitamin B-12 (CYANOCOBALAMIN) 1000 MCG tablet Take 1,000 mcg by mouth daily.   Yes [provider]  vitamin C (ASCORBIC ACID) 500 MG tablet Take 500 mg by mouth daily.     Yes [provider]  VITAMIN E PO Take 1 tablet by mouth daily.   Yes [provider]  feeding supplement, ENSURE ENLIVE, (ENSURE ENLIVE) LIQD Take 237 mLs by mouth 2 (two) times daily between meals. Patient not taking: Reported on 11/18/2018 08/10/18   Raiford Noble Latif, DO  sodium bicarbonate 650 MG tablet Take 1 tablet (650 mg total) by mouth daily. Patient not taking: Reported on 11/18/2018 08/09/18   Kerney Elbe, DO    Physical Exam:  Constitutional: NAD, calm, comfortable Vitals:   11/18/18 1433 11/18/18 1500 11/18/18 1531 11/18/18 1534  BP: (!) 140/48 (!) 108/55  (!) 124/56  Pulse:  73  86  Resp: 18 (!) 21  16  Temp:    (!) 97.4 F (36.3 C)  TempSrc:    Oral  SpO2:  99%  (!) 89%  Height:   5\' 4"  (1.626 m)    Eyes: PERRL, lids and conjunctivae normal ENMT:  Mucous membranes are dry.  Neck: normal, supple,  Respiratory: clear to auscultation bilaterally,  Normal respiratory effort. No accessory muscle use.  Cardiovascular: Regular rate and rhythm, S1S2, no pedal edema.  Abdomen: soft , NT , ND BS+ Musculoskeletal: no clubbing / cyanosis. Skin: no rashes, lesions, ulcers. No induration Neurologic: alert and oriented to person and place. Able to ambulate .  Psychiatric:  Normal mood.     Labs on Admission: I have personally reviewed following labs and imaging studies  CBC: Recent Labs  Lab 11/18/18 1211  WBC 7.6  NEUTROABS 5.3  HGB 11.1*  HCT 32.3*  MCV 89.0  PLT [id number]   Basic Metabolic Panel: Recent Labs  Lab 11/18/18 1211  NA 133*  K 4.4  CL 102  CO2 22  GLUCOSE 101*  BUN 55*  CREATININE 2.09*  CALCIUM 10.1   GFR: CrCl cannot be calculated (Unknown ideal weight.). Liver Function Tests: Recent Labs  Lab 11/18/18 1211  AST 19  ALT 26  ALKPHOS 79  BILITOT 0.5  PROT 6.5  ALBUMIN 3.5   No results for input(s): LIPASE, AMYLASE in the last 168 hours. No results for input(s): AMMONIA in the last 168 hours. Coagulation Profile: No results for input(s): INR, PROTIME in the last 168 hours. Cardiac Enzymes: No results for input(s): CKTOTAL, CKMB, CKMBINDEX, TROPONINI in the last 168 hours. BNP (last 3 results) No results for input(s): PROBNP in the last 8760 hours. HbA1C: No results for input(s): HGBA1C in the last 72 hours. CBG: No results for input(s): GLUCAP in the last 168 hours. Lipid Profile: No results for input(s): CHOL, HDL, LDLCALC, TRIG, CHOLHDL, LDLDIRECT in the last 72 hours. Thyroid Function Tests: No results for input(s): TSH, T4TOTAL, FREET4, T3FREE, THYROIDAB in the last 72 hours. Anemia Panel: No results for input(s): VITAMINB12, FOLATE, FERRITIN, TIBC, IRON, RETICCTPCT in the last 72 hours. Urine analysis:    Component Value Date/Time   COLORURINE RED (A) 11/18/2018 1152   APPEARANCEUR CLOUDY  (A) 11/18/2018 1152   LABSPEC 1.013 11/18/2018 1152   PHURINE 6.0 11/18/2018 1152   GLUCOSEU NEGATIVE 11/18/2018 1152   HGBUR LARGE (A) 11/18/2018 1152   BILIRUBINUR NEGATIVE 11/18/2018 1152   KETONESUR NEGATIVE 11/18/2018 1152   PROTEINUR 30 (A) 11/18/2018 1152   UROBILINOGEN 0.2 06/25/2011 1028   NITRITE NEGATIVE 11/18/2018 1152   LEUKOCYTESUR LARGE (A) 11/18/2018 1152    Radiological Exams on Admission: No results found.  EKG: not done.   Assessment/Plan Active Problems:   UTI (urinary tract infection)    Acute metabolic encephalopathy Probably secondary to AKI and urinary tract infection superimposed on underlying dementia Hydrate treat with IV Rocephin for the urinary tract infection and repeat renal parameters in a.m.  Continue with home Aricept and Namenda. Follow urine cultures. CT head without contrast did not show any acute intracranial abnormalities.   Generalized weakness Deconditioning secondary to UTI and AKI.  PT eval/OT eval ordered.   Mild hyponatremia Probably from dehydration Hydrated and repeat sodium levels in the morning.   Hypertension Blood pressure para meters optimal  Severity of Illness: The appropriate patient status for this patient is INPATIENT. Inpatient status is judged to be reasonable and necessary in order to provide the required intensity of service to ensure the patient's safety. The patient's presenting symptoms, physical exam findings, and initial radiographic and laboratory data in the context of their chronic comorbidities is felt to place them at high risk for further clinical deterioration. Furthermore, it is not anticipated that the patient will be medically stable for discharge from the hospital within 2 midnights of admission. The following factors support the patient status of inpatient.   " The patient's presenting symptoms include increased confusion. " The initial radiographic and laboratory data are worrisome because of  UTI, AKI, " The chronic co-morbidities include dementia and hypertension.    * I certify that at the point of admission it is my clinical judgment that the patient will require inpatient hospital care spanning beyond 2 midnights from the point of admission due to high intensity of service, high risk for further deterioration and high frequency of surveillance required.*    DVT prophylaxis: Lovenox Code Status: Full code Family Communication: None at bedside Disposition  Plan: Pending further evaluation Consults called: None Admission status: Inpatient/telemetry  Hosie Poisson MD [facility] Pager [phone]  If 7PM-7AM, please contact night-coverage [email or web address] Password [facility]  11/18/2018, 3:41 PM

## 2018-11-18 NOTE — ED Triage Notes (Signed)
Pt BIBA from home. Pt has been urinating more frequently and with a foul odor per family. Hx of frequent UTIs. Hx of dementia, A&Ox2.

## 2018-11-18 NOTE — ED Notes (Signed)
ED TO INPATIENT HANDOFF REPORT  Name/Age/Gender Shannon Blair 85 y.o. female  Code Status Code Status History    Date Active Date Inactive Code Status Order ID Comments User Context   08/06/2018 0746 08/09/2018 2203 DNR [id number]  Rise Patience, MD Inpatient   08/06/2018 0620 08/06/2018 0746 Full Code [id number]  Rise Patience, MD Inpatient   07/06/2018 0053 07/08/2018 1530 Full Code [id number]  Ivor Costa, MD ED   07/26/2016 1548 07/27/2016 1934 Full Code [id number]  Latanya Maudlin, MD Inpatient   Advance Care Planning Activity    Questions for Most Recent Historical Code Status (Order [id number])    Question Answer Comment   In the event of cardiac or respiratory ARREST Do not call a "code blue"    In the event of cardiac or respiratory ARREST Do not perform Intubation, CPR, defibrillation or ACLS    In the event of cardiac or respiratory ARREST Use medication by any route, position, wound care, and other measures to relive pain and suffering. May use oxygen, suction and manual treatment of airway obstruction as needed for comfort.         Advance Directive Documentation     Most Recent Value  Type of Advance Directive  Healthcare Power of Attorney  Pre-existing out of facility DNR order (yellow form or pink MOST form)  -  "MOST" Form in Place?  -      Home/SNF/Other Home  Chief Complaint possible uti  Level of Care/Admitting Diagnosis ED Disposition    ED Disposition Condition [place]: [place] [[id number]]  Level of Care: Telemetry [5]  Admit to tele based on following criteria: Other see comments  Comments: acute encephalopathy, generalized weakness.  Covid Evaluation: Asymptomatic Screening Protocol (No Symptoms)  Diagnosis: UTI (urinary tract infection) [id number]  Admitting Physician: Hosie Poisson [[id number]]  Attending Physician: Hosie Poisson [[id number]]  Estimated length of stay: past midnight tomorrow  Certification:: I  certify this patient will need inpatient services for at least 2 midnights  PT Class (Do Not Modify): Inpatient [101]  PT Acc Code (Do Not Modify): Private [1]       Medical History Past Medical History:  Diagnosis Date  . Anal cancer ([place]) 2004   Non-surgical mgt with chemo-radiation.  Dr Alen Blew follows.   . Anal fissure   . Colon, diverticulosis    Sigmoid. With associated stenosis.  Colonoscopy failed to intubate beyond stenosis in 08/2010.  rectal bx then negative for recurrent cancer.   . Dementia ([place])   . Hypertension   . OA (osteoarthritis)   . Squamous cell carcinoma    skin  . Varicose veins of left lower extremity with pain     Allergies No Known Allergies  IV Location/Drains/Wounds Patient Lines/Drains/Airways Status   Active Line/Drains/Airways    Name:   Placement date:   Placement time:   Site:   Days:   Peripheral IV 11/18/18 Left Forearm   11/18/18    1227    Forearm   less than 1   External Urinary Catheter   11/18/18    1230    -   less than 1   Incision (Closed) 07/26/16 Back Other (Comment)   07/26/16    1416     845          Labs/Imaging Results for orders placed or performed during the hospital encounter of 11/18/18 (from the past 48 hour(s))  Urinalysis, Routine w reflex microscopic  Status: Abnormal   Collection Time: 11/18/18 11:52 AM  Result Value Ref Range   Color, Urine RED (A) YELLOW    Comment: BIOCHEMICALS MAY BE AFFECTED BY COLOR   APPearance CLOUDY (A) CLEAR   Specific Gravity, Urine 1.013 1.005 - 1.030   pH 6.0 5.0 - 8.0   Glucose, UA NEGATIVE NEGATIVE mg/dL   Hgb urine dipstick LARGE (A) NEGATIVE   Bilirubin Urine NEGATIVE NEGATIVE   Ketones, ur NEGATIVE NEGATIVE mg/dL   Protein, ur 30 (A) NEGATIVE mg/dL   Nitrite NEGATIVE NEGATIVE   Leukocytes,Ua LARGE (A) NEGATIVE   RBC / HPF >50 (H) 0 - 5 RBC/hpf   WBC, UA 21-50 0 - 5 WBC/hpf   Bacteria, UA RARE (A) NONE SEEN   Squamous Epithelial / LPF 0-5 0 - 5   WBC Clumps PRESENT      Comment: Performed at [facility], [place] [address], [place], [place] [address]  Comprehensive metabolic panel     Status: Abnormal   Collection Time: 11/18/18 12:11 PM  Result Value Ref Range   Sodium 133 (L) 135 - 145 mmol/L   Potassium 4.4 3.5 - 5.1 mmol/L   Chloride 102 98 - 111 mmol/L   CO2 22 22 - 32 mmol/L   Glucose, Bld 101 (H) 70 - 99 mg/dL   BUN 55 (H) 8 - 23 mg/dL   Creatinine, Ser 2.09 (H) 0.44 - 1.00 mg/dL   Calcium 10.1 8.9 - 10.3 mg/dL   Total Protein 6.5 6.5 - 8.1 g/dL   Albumin 3.5 3.5 - 5.0 g/dL   AST 19 15 - 41 U/L   ALT 26 0 - 44 U/L   Alkaline Phosphatase 79 38 - 126 U/L   Total Bilirubin 0.5 0.3 - 1.2 mg/dL   GFR calc non Af Amer 21 (L) >60 mL/min   GFR calc Af Amer 25 (L) >60 mL/min   Anion gap 9 5 - 15    Comment: Performed at [facility], [place] [address], [place], [place] [address]  CBC with Differential     Status: Abnormal   Collection Time: 11/18/18 12:11 PM  Result Value Ref Range   WBC 7.6 4.0 - 10.5 K/uL   RBC 3.63 (L) 3.87 - 5.11 MIL/uL   Hemoglobin 11.1 (L) 12.0 - 15.0 g/dL   HCT 32.3 (L) 36.0 - 46.0 %   MCV 89.0 80.0 - 100.0 fL   MCH 30.6 26.0 - 34.0 pg   MCHC 34.4 30.0 - 36.0 g/dL   RDW 15.7 (H) 11.5 - 15.5 %   Platelets 173 150 - 400 K/uL   nRBC 0.0 0.0 - 0.2 %   Neutrophils Relative % 69 %   Neutro Abs 5.3 1.7 - 7.7 K/uL   Lymphocytes Relative 17 %   Lymphs Abs 1.3 0.7 - 4.0 K/uL   Monocytes Relative 9 %   Monocytes Absolute 0.7 0.1 - 1.0 K/uL   Eosinophils Relative 4 %   Eosinophils Absolute 0.3 0.0 - 0.5 K/uL   Basophils Relative 0 %   Basophils Absolute 0.0 0.0 - 0.1 K/uL   Immature Granulocytes 1 %   Abs Immature Granulocytes 0.08 (H) 0.00 - 0.07 K/uL    Comment: Performed at [facility], [place] [address], [place], [place] [address]   No results found.  Pending Labs Unresulted Labs (From admission, onward)    Start     Ordered   11/18/18 1209  SARS CORONAVIRUS 2  (TAT 6-24  HRS) Nasopharyngeal Nasopharyngeal Swab  (Asymptomatic/Tier 2 Patients Labs)  Once,   STAT    Question Answer Comment  Is this test for diagnosis or screening Screening   Symptomatic for COVID-19 as defined by CDC No   Hospitalized for COVID-19 No   Admitted to ICU for COVID-19 No   Previously tested for COVID-19 No   Resident in a congregate (group) care setting No   Employed in healthcare setting No   Pregnant No      11/18/18 1209   11/18/18 1152  Urine culture  ONCE - STAT,   STAT     11/18/18 1151          Vitals/Pain Today's Vitals   11/18/18 1300 11/18/18 1329 11/18/18 1330 11/18/18 1400  BP: (!) 110/52  (!) 119/54 109/69  Pulse: 73  66 (!) 59  Resp: (!) 24  (!) 22 16  Temp:      TempSrc:      SpO2: 96% 100% 100% 92%  PainSc:        Isolation Precautions No active isolations  Medications Medications  sodium chloride 0.9 % bolus 500 mL (0 mLs Intravenous Stopped 11/18/18 1320)  cefTRIAXone (ROCEPHIN) 1 g in sodium chloride 0.9 % 100 mL IVPB (1 g Intravenous New Bag/Given 11/18/18 1320)    Mobility walks with device

## 2018-11-18 NOTE — ED Notes (Signed)
Attempted to ambulate pt. Pt unable to bear weight, and unable to stand without significant assistance.

## 2018-11-18 NOTE — ED Notes (Signed)
Pt found on the ground by Di Kindle, Therapist, sports. Pt assisted back to bed by this RN, Di Kindle RN, Frederico Hamman RN, and [place] PA. Pt ambulatory at baseline status.

## 2018-11-19 LAB — COMPREHENSIVE METABOLIC PANEL
ALT: 25 U/L (ref 0–44)
AST: 19 U/L (ref 15–41)
Albumin: 3.4 g/dL — ABNORMAL LOW (ref 3.5–5.0)
Alkaline Phosphatase: 84 U/L (ref 38–126)
Anion gap: 9 (ref 5–15)
BUN: 41 mg/dL — ABNORMAL HIGH (ref 8–23)
CO2: 20 mmol/L — ABNORMAL LOW (ref 22–32)
Calcium: 10.1 mg/dL (ref 8.9–10.3)
Chloride: 107 mmol/L (ref 98–111)
Creatinine, Ser: 1.58 mg/dL — ABNORMAL HIGH (ref 0.44–1.00)
GFR calc Af Amer: 34 mL/min — ABNORMAL LOW (ref >60)
GFR calc non Af Amer: 30 mL/min — ABNORMAL LOW (ref >60)
Glucose, Bld: 109 mg/dL — ABNORMAL HIGH (ref 70–99)
Potassium: 4.6 mmol/L (ref 3.5–5.1)
Sodium: 136 mmol/L (ref 135–145)
Total Bilirubin: 0.4 mg/dL (ref 0.3–1.2)
Total Protein: 6.4 g/dL — ABNORMAL LOW (ref 6.5–8.1)

## 2018-11-19 LAB — CBC
HCT: 31.8 % — ABNORMAL LOW (ref 36.0–46.0)
Hemoglobin: 10.7 g/dL — ABNORMAL LOW (ref 12.0–15.0)
MCH: 30 pg (ref 26.0–34.0)
MCHC: 33.6 g/dL (ref 30.0–36.0)
MCV: 89.1 fL (ref 80.0–100.0)
Platelets: 185 10*3/uL (ref 150–400)
RBC: 3.57 MIL/uL — ABNORMAL LOW (ref 3.87–5.11)
RDW: 15.3 % (ref 11.5–15.5)
WBC: 5.4 10*3/uL (ref 4.0–10.5)
nRBC: 0 % (ref 0.0–0.2)

## 2018-11-19 LAB — URINE CULTURE: Culture: NO GROWTH

## 2018-11-19 LAB — MAGNESIUM: Magnesium: 2.3 mg/dL (ref 1.7–2.4)

## 2018-11-19 MED ORDER — ENSURE ENLIVE PO LIQD
237.0000 mL | Freq: Two times a day (BID) | ORAL | Status: DC
  Administered 2018-11-21: 10:00:00 237 mL via ORAL

## 2018-11-19 MED ORDER — DIPHENHYDRAMINE HCL 12.5 MG/5ML PO ELIX
12.5000 mg | ORAL_SOLUTION | Freq: Four times a day (QID) | ORAL | Status: DC | PRN
  Administered 2018-11-19: 19:00:00 12.5 mg via ORAL
  Filled 2018-11-19: qty 5

## 2018-11-19 MED ORDER — RESOURCE THICKENUP CLEAR PO POWD
ORAL | Status: DC | PRN
  Filled 2018-11-19: qty 125

## 2018-11-19 MED ORDER — LACTATED RINGERS IV SOLN
INTRAVENOUS | Status: AC
  Administered 2018-11-19: 22:00:00 via INTRAVENOUS

## 2018-11-19 NOTE — Progress Notes (Signed)
Pt had fish from the cafeteria for dinner. The pt's nurse tech asked me to come to the room. This RN went to the room and assessed the patient. There was some redness across the bridge of the nose was noted. The pt denied any shortness of breath and itching. There was no swelling noted. This RN notified the pt's provider. The provider ordered benadryl to be given if needed and stated he would come and assess the pt. Will continue monitor.

## 2018-11-19 NOTE — Progress Notes (Signed)
PROGRESS NOTE    Shannon Blair  [id number] DOB: 04/03/1931 DOA: 11/18/2018 PCP: Merrilee Seashore, MD   Brief Narrative:  Shannon Blair is Shannon Blair 85 y.o. female with medical history significant of dementia, hypertension, OA, diverticulosis, was brought in by family for worsening weakness, fatigue, urinary incontinence and increasing confusion since 2 days patient has dementia at baseline but family reports she is more confused than baseline.  No history of fevers or chills.  Patient is Shannon Obara poor historian is alert and oriented to person only on my exam.  Most of the history is obtained from the ED note and family.  No history of nausea or vomiting or diarrhea.  Patient denies any chest pain or shortness of breath.  ED Course: On arrival to ED she was afebrile slightly tachypneic blood pressure of 140/48. Labs reveal sodium of 133, creatinine of 2.09, BUN of 55, hemoglobin of 11.1.  COVID-19 screening is pending, urine analysis shows large leukocytes and bacteria.  Urine cultures are sent and pending head without contrast does not show any acute intracranial abnormalities.  She was given Aishia Barkey fluid bolus normal saline 1 L and was started on Rocephin and was referred to [facility] for admission for evaluation of acute encephalopathy and urinary tract infection.  Assessment & Plan:   Active Problems:   UTI (urinary tract infection)  Acute metabolic encephalopathy 2/2 UTI Probably secondary to AKI and urinary tract infection superimposed on underlying dementia - continues to be Shannon Blair today Continue treatment of UTI below Continue with home Aricept and Namenda. Follow urine cultures. CT head without contrast did not show any acute intracranial abnormalities Delirium precautions W/u further if not continuing to improve  Acute Kidney Injury: baseline ~1.3.  Peaked at 2.09.  Continue to monitor with IVF.  Consider renal US if does not continue to improve.  NAGMA: continue to monitor, maybe related  to NS resuscitation, transition to LR  UTI: follow cx and continue abx  Generalized weakness Deconditioning secondary to UTI and AKI.  PT eval/OT eval ordered.  Mild hyponatremia improved  Hypertension Blood pressure para meters optimal  Facial Rash: noticed after eating tilapia at dinner on 8/31.  No swelling or difficulty swallowing or itching.  Give benadryl and continue to monitor.  Added tilapia.  DVT prophylaxis: lovenox Code Status: full  Family Communication: called joann rice, legal guardian - no answer, left message Disposition Plan: pending cultures and d/c plan   Consultants:   none  Procedures:   none  Antimicrobials:  Anti-infectives (From admission, onward)   Start     Dose/Rate Route Frequency Ordered Stop   11/19/18 1200  cefTRIAXone (ROCEPHIN) 1 g in sodium chloride 0.9 % 100 mL IVPB     1 g 200 mL/hr over 30 Minutes Intravenous Every 24 hours 11/18/18 1541     11/18/18 2000  trimethoprim (TRIMPEX) tablet 100 mg     100 mg Oral Daily 11/18/18 1701     11/18/18 1315  cefTRIAXone (ROCEPHIN) 1 g in sodium chloride 0.9 % 100 mL IVPB     1 g 200 mL/hr over 30 Minutes Intravenous  Once 11/18/18 1313 11/18/18 1351         Subjective: Shannon Woolstenhulme&ox1.  Denies pain.  Unable to tell me why she's here.  Objective: Vitals:   11/18/18 1534 11/18/18 2118 11/19/18 0632 11/19/18 1421  BP: (!) 124/56 (!) 144/51 (!) 141/56 (!) 150/64  Pulse: 86 84 88 70  Resp: 16 18 18 18   Temp: (!) 97.4  F (36.3 C) 98.7 F (37.1 C) 98.5 F (36.9 C) 98 F (36.7 C)  TempSrc: Oral Oral Oral Oral  SpO2: (!) 89% 100% 100% 98%  Height:        Intake/Output Summary (Last 24 hours) at 11/19/2018 1919 Last data filed at 11/19/2018 1852 Gross per 24 hour  Intake 1992.32 ml  Output 1200 ml  Net 792.32 ml   There were no vitals filed for this visit.  Examination:  General exam: Appears calm and comfortable  Respiratory system: Clear to auscultation. Respiratory effort normal.  Cardiovascular system: S1 & S2 heard, RRR.  Gastrointestinal system: Abdomen is nondistended, soft and nontender.  Central nervous system: Alert and oriented x1. No focal neurological deficits. Extremities: no LEE Skin: No rashes, lesions or ulcers Psychiatry: Judgement and insight appear normal. Mood & affect appropriate.     Data Reviewed: I have personally reviewed following labs and imaging studies  CBC: Recent Labs  Lab 11/18/18 1211 11/19/18 0901  WBC 7.6 5.4  NEUTROABS 5.3  --   HGB 11.1* 10.7*  HCT 32.3* 31.8*  MCV 89.0 89.1  PLT 173 [id number]   Basic Metabolic Panel: Recent Labs  Lab 11/18/18 1211 11/19/18 0901  NA 133* 136  K 4.4 4.6  CL 102 107  CO2 22 20*  GLUCOSE 101* 109*  BUN 55* 41*  CREATININE 2.09* 1.58*  CALCIUM 10.1 10.1  MG  --  2.3   GFR: CrCl cannot be calculated (Unknown ideal weight.). Liver Function Tests: Recent Labs  Lab 11/18/18 1211 11/19/18 0901  AST 19 19  ALT 26 25  ALKPHOS 79 84  BILITOT 0.5 0.4  PROT 6.5 6.4*  ALBUMIN 3.5 3.4*   No results for input(s): LIPASE, AMYLASE in the last 168 hours. No results for input(s): AMMONIA in the last 168 hours. Coagulation Profile: No results for input(s): INR, PROTIME in the last 168 hours. Cardiac Enzymes: No results for input(s): CKTOTAL, CKMB, CKMBINDEX, TROPONINI in the last 168 hours. BNP (last 3 results) No results for input(s): PROBNP in the last 8760 hours. HbA1C: No results for input(s): HGBA1C in the last 72 hours. CBG: No results for input(s): GLUCAP in the last 168 hours. Lipid Profile: No results for input(s): CHOL, HDL, LDLCALC, TRIG, CHOLHDL, LDLDIRECT in the last 72 hours. Thyroid Function Tests: No results for input(s): TSH, T4TOTAL, FREET4, T3FREE, THYROIDAB in the last 72 hours. Anemia Panel: No results for input(s): VITAMINB12, FOLATE, FERRITIN, TIBC, IRON, RETICCTPCT in the last 72 hours. Sepsis Labs: No results for input(s): PROCALCITON, LATICACIDVEN in the  last 168 hours.  Recent Results (from the past 240 hour(s))  SARS Coronavirus 2 [facility] order, Performed in [place] hospital lab)     Status: None   Collection Time: 11/18/18 12:32 PM  Result Value Ref Range Status   SARS Coronavirus 2 NEGATIVE NEGATIVE Final    Comment: (NOTE) If result is NEGATIVE SARS-CoV-2 target nucleic acids are NOT DETECTED. The SARS-CoV-2 RNA is generally detectable in upper and lower  respiratory specimens during the acute phase of infection. The lowest  concentration of SARS-CoV-2 viral copies this assay can detect is 250  copies / mL. Shannon Blair negative result does not preclude SARS-CoV-2 infection  and should not be used as the sole basis for treatment or other  patient management decisions.  Shannon Blair negative result may occur with  improper specimen collection / handling, submission of specimen other  than nasopharyngeal swab, presence of viral mutation(s) within the  areas targeted by this  assay, and inadequate number of viral copies  (<250 copies / mL). Shannon Blair negative result must be combined with clinical  observations, patient history, and epidemiological information. If result is POSITIVE SARS-CoV-2 target nucleic acids are DETECTED. The SARS-CoV-2 RNA is generally detectable in upper and lower  respiratory specimens dur ing the acute phase of infection.  Positive  results are indicative of active infection with SARS-CoV-2.  Clinical  correlation with patient history and other diagnostic information is  necessary to determine patient infection status.  Positive results do  not rule out bacterial infection or co-infection with other viruses. If result is PRESUMPTIVE POSTIVE SARS-CoV-2 nucleic acids MAY BE PRESENT.   Shannon Blair presumptive positive result was obtained on the submitted specimen  and confirmed on repeat testing.  While 2019 novel coronavirus  (SARS-CoV-2) nucleic acids may be present in the submitted sample  additional confirmatory testing may be necessary  for epidemiological  and / or clinical management purposes  to differentiate between  SARS-CoV-2 and other Sarbecovirus currently known to infect humans.  If clinically indicated additional testing with an alternate test  methodology [phone]) is advised. The SARS-CoV-2 RNA is generally  detectable in upper and lower respiratory sp ecimens during the acute  phase of infection. The expected result is Negative. Fact Sheet for Patients:  StrictlyIdeas.no Fact Sheet for Healthcare Providers: BankingDealers.co.za This test is not yet approved or cleared by the [place] FDA and has been authorized for detection and/or diagnosis of SARS-CoV-2 by FDA under an Emergency Use Authorization (EUA).  This EUA will remain in effect (meaning this test can be used) for the duration of the COVID-19 declaration under Section 564(b)(1) of the Act, 21 U.S.C. section 360bbb-3(b)(1), unless the authorization is terminated or revoked sooner. Performed at [facility], [place] [address] [place], [place] [address]          Radiology Studies: Ct Head Wo Contrast  Result Date: 11/18/2018 CLINICAL DATA:  HPI Level 5 caveat for severe dementia. 85 year old female comes in [facility] chief complaint of weakness. Patient has dementia. According to patient's friend, who is also listed as legal guardian, patient started getting worse on Thursday. EXAM: CT HEAD WITHOUT CONTRAST TECHNIQUE: Contiguous axial images were obtained from the base of the skull through the vertex without intravenous contrast. COMPARISON:  08/06/2018 FINDINGS: Brain: There is central and cortical atrophy. Severe periventricular white matter changes are consistent small vessel disease and stable in appearance. Small remote lacunar infarct of the RIGHT basal ganglia. There is no intra or extra-axial fluid collection or mass lesion. The basilar cisterns and ventricles have Charla Criscione normal  appearance. There is no CT evidence for acute infarction or hemorrhage. Vascular: There is dense atherosclerotic calcification of the internal carotid arteries. No hyperdense vessels. Skull: Normal. Negative for fracture or focal lesion. Sinuses/Orbits: No acute finding. Other: None. IMPRESSION: 1. Atrophy and small vessel disease, stable. 2. No evidence for acute intracranial abnormality. Electronically Signed   By: Nolon Nations M.D.   On: 11/18/2018 16:43        Scheduled Meds: . aspirin EC  81 mg Oral Daily  . donepezil  10 mg Oral QHS  . enoxaparin (LOVENOX) injection  30 mg Subcutaneous Q24H  . feeding supplement (ENSURE ENLIVE)  237 mL Oral BID BM  . memantine  10 mg Oral BID  . oxybutynin  5 mg Oral BID  . trimethoprim  100 mg Oral Daily  . venlafaxine XR  75 mg Oral Q breakfast  . vitamin  B-12  1,000 mcg Oral Daily   Continuous Infusions: . sodium chloride 75 mL/hr at 11/19/18 1852  . cefTRIAXone (ROCEPHIN)  IV Stopped (11/19/18 1831)     LOS: 1 day    Time spent: over 79 min    Fayrene Helper, MD [facility] Pager AMION  If 7PM-7AM, please contact night-coverage [email or web address] Password [facility] 11/19/2018, 7:19 PM

## 2018-11-19 NOTE — Evaluation (Signed)
Physical Therapy Evaluation Patient Details Name: COURTLAND MELLIN MRN: [id number] DOB: [birth year] Today's Date: 11/19/2018   History of Present Illness  Pt presents with UTI and increased confusion.  Note history of HTN, dementia, anal cancer and has had frequent UTI's in the past.  No family present at time of evaluation to determine baseline functioning or home set up.  She reports she lives with sister "Raquel Sarna" however reported to SLP that she lived alone.  Clinical Impression  Pt currently requires +2 A for safety for all bed mobility and sit<>stand.  Did not attempt ambulation today due to needing increased assist with 2nd sit<>stand.  Unsure of pts prior living situation or set up, but will likely benefit from SNF to address ongoing deficits and to ensure safe D/C home.  Pt will benefit from acute PT in order to address deficits.      Follow Up Recommendations SNF;Supervision/Assistance - 24 hour    Equipment Recommendations  Rolling walker with 5" wheels    Recommendations for Other Services       Precautions / Restrictions Precautions Precautions: Fall Precaution Comments: heavy R lateral lean in sitting and standing Restrictions Weight Bearing Restrictions: No      Mobility  Bed Mobility Overal bed mobility: Needs Assistance Bed Mobility: Supine to Sit;Sit to Supine     Supine to sit: +2 for safety/equipment;Max assist;Mod assist Sit to supine: Mod assist;+2 for physical assistance   General bed mobility comments: Pt did initiate movement towards EOB, however needed assist to guide LEs all the way around and also assist to elevate trunk into sitting.  Note heavy R lateral lean in sitting requiring more mod to max A to correct with cues to reach with LUE.  She did initiate a little more movement when getting back into bed and did assist with bridging to scoot over in bed.  Transfers Overall transfer level: Needs assistance Equipment used: Rolling walker (2  wheeled) Transfers: Sit to/from Stand Sit to Stand: +2 physical assistance;Mod assist         General transfer comment: Pt did very well standing on first trial to RW with cues for upright posture.  Note that she does not seem to place weight all the way on RLE when standing and needs support to prevent overt R lateral trunk flexion.  During second rep, pt did need increased support and was unable to get into fully upright position.  Ambulation/Gait                Stairs            Wheelchair Mobility    Modified Rankin (Stroke Patients Only)       Balance Overall balance assessment: Needs assistance Sitting-balance support: Single extremity supported;Bilateral upper extremity supported;Feet supported Sitting balance-Leahy Scale: Poor Sitting balance - Comments: max fading to min/mod A for sitting balance on EOB. Postural control: Right lateral lean Standing balance support: Bilateral upper extremity supported;During functional activity Standing balance-Leahy Scale: Poor Standing balance comment: mod A with BUE support on RW for standing balance.                             Pertinent Vitals/Pain Pain Assessment: Faces Faces Pain Scale: No hurt    Home Living Family/patient expects to be discharged to:: Unsure Living Arrangements: Alone               Additional Comments: Unsure of pts living conditions.  She reports to SLP she lives alone, but reports to PT/OT she lives with sister.    Prior Function           Comments: Unsure     Hand Dominance        Extremity/Trunk Assessment   Upper Extremity Assessment Upper Extremity Assessment: Defer to OT evaluation    Lower Extremity Assessment Lower Extremity Assessment: Generalized weakness;Difficult to assess due to impaired cognition    Cervical / Trunk Assessment Cervical / Trunk Assessment: Kyphotic;Other exceptions Cervical / Trunk Exceptions: Pt with heavy L lateral lean  in sitting/standing  Communication   Communication: (Unclear due to history of dementia.)  Cognition Arousal/Alertness: Lethargic Behavior During Therapy: WFL for tasks assessed/performed Overall Cognitive Status: No family/caregiver present to determine baseline cognitive functioning                                 General Comments: Unsure of baseline cognitive deficits      General Comments      Exercises     Assessment/Plan    PT Assessment Patient needs continued PT services  PT Problem List Decreased strength;Decreased activity tolerance;Decreased balance;Decreased mobility;Decreased cognition;Decreased knowledge of use of DME;Decreased safety awareness;Decreased knowledge of precautions       PT Treatment Interventions DME instruction;Gait training;Functional mobility training;Therapeutic activities;Therapeutic exercise;Balance training;Cognitive remediation;Patient/family education    PT Goals (Current goals can be found in the Care Plan section)  Acute Rehab PT Goals Patient Stated Goal: Pt unable to share goals due to cognition PT Goal Formulation: Patient unable to participate in goal setting Time For Goal Achievement: 11/30/18 Potential to Achieve Goals: Fair    Frequency Min 3X/week   Barriers to discharge Decreased caregiver support Unsure of prior living situation or home set up    Co-evaluation PT/OT/SLP Co-Evaluation/Treatment: Yes Reason for Co-Treatment: Complexity of the patient's impairments (multi-system involvement);For patient/therapist safety;To address functional/ADL transfers PT goals addressed during session: Mobility/safety with mobility;Balance;Proper use of DME;Strengthening/ROM         AM-PAC PT "6 Clicks" Mobility  Outcome Measure Help needed turning from your back to your side while in a flat bed without using bedrails?: A Lot Help needed moving from lying on your back to sitting on the side of a flat bed without using  bedrails?: A Lot Help needed moving to and from a bed to a chair (including a wheelchair)?: A Lot Help needed standing up from a chair using your arms (e.g., wheelchair or bedside chair)?: A Lot Help needed to walk in hospital room?: Total Help needed climbing 3-5 steps with a railing? : Total 6 Click Score: 10    End of Session   Activity Tolerance: Patient limited by fatigue;Patient limited by lethargy Patient left: in bed;with call bell/phone within reach;with bed alarm set Nurse Communication: Mobility status PT Visit Diagnosis: Unsteadiness on feet (R26.81);Muscle weakness (generalized) (M62.81);Difficulty in walking, not elsewhere classified (R26.2);Other symptoms and signs involving the nervous system (R29.898)    Time: [id number] PT Time Calculation (min) (ACUTE ONLY): 28 min   Charges:   PT Evaluation $PT Eval Moderate Complexity: 1 Mod          Daksha Koone, PT, MPT  11/19/18, 3:04 PM   Ainara Eldridge, Betha Loa 11/19/2018, 3:03 PM

## 2018-11-19 NOTE — Progress Notes (Signed)
Initial Nutrition Assessment  INTERVENTION:   -Ensure Enlive po BID, each supplement provides 350 kcal and 20 grams of protein -Magic cup TID with meals, each supplement provides 290 kcal and 9 grams of protein  NUTRITION DIAGNOSIS:   Inadequate oral intake related to dysphagia, lethargy/confusion as evidenced by meal completion < 50%.  GOAL:   Patient will meet greater than or equal to 90% of their needs  MONITOR:   PO intake, Supplement acceptance, Labs, Weight trends, I & O's  REASON FOR ASSESSMENT:   Consult Assessment of nutrition requirement/status  ASSESSMENT:   85 y.o. female with medical history significant of dementia, hypertension, OA, diverticulosis, was brought in by family for worsening weakness, fatigue, urinary incontinence and increasing confusion since 2 days patient has dementia at baseline but family reports she is more confused than baseline.  **RD working remotely.**  Patient with dementia, unable to give history. Per SLP evaluation note today, pt with moderate aspiration risk r/t mentation. Recommended dysphagia 3 diet with nectar thick liquids.  Pt consumed 50% of breakfast this morning.  Will order Ensure supplements and Magic Cups with meals for additional kcal protein.  No weight has been measured for this admission. Last weight documented in from May 2020.   Medications: vitamin B-12 tablet daily Labs reviewed:  GFR: 30  NUTRITION - FOCUSED PHYSICAL EXAM:  Unable to perform -working remotely  Diet Order:   Diet Order            DIET DYS 3 Room service appropriate? No; Fluid consistency: Nectar Thick  Diet effective now              EDUCATION NEEDS:   No education needs have been identified at this time  Skin:  Skin Assessment: Reviewed RN Assessment  Last BM:  PTA  Height:   Ht Readings from Last 1 Encounters:  11/18/18 5\' 4"  (1.626 m)    Weight:   Wt Readings from Last 1 Encounters:  08/06/18 59 kg    Ideal Body  Weight:  54.5 kg  BMI:  Body mass index is 22.31 kg/m.  Estimated Nutritional Needs:   Kcal:  1350-1550 (using IBW, no weight for admission).  Protein:  60-70g  Fluid:  1.5L/day  Clayton Bibles, MS, RD, LDN [place] Diet[facility] Pager: [phone] After Hours Pager: [phone]

## 2018-11-19 NOTE — Progress Notes (Signed)
BSE completed, full report to follow.  Pt with clinical indication of dysphagia and concern with aspiration with thin liquids most notably - suspect due to premature spillage of bolus into airway.  Good tolerance of tsps fhin, nectar and soft solids.   Recommend temporarily modify diet to maximize airway protection while pt acutely ill with her UTI.  Dys3/nectar tsps thin ok Full supervision, pt assist to self feed  Luanna Salk, [facility] SLP [place] Pager [phone] Office [phone]

## 2018-11-19 NOTE — Evaluation (Signed)
Occupational Therapy Evaluation Patient Details Name: Shannon Blair MRN: [id number] DOB: 12-Jul-1931 Today's Date: 11/19/2018    History of Present Illness Pt presents with UTI and increased confusion.  Note history of HTN, dementia, anal cancer and has had frequent UTI's in the past.    Clinical Impression   This 85 year old female was admitted for the above.  Per chart, she has dementia, but is more confused than baseline. No family present; unsure of PLOF/support. Pt had poor attention to task, but she did initiate tasks. She was leaning to R when sitting EOB and with standing. Will follow in acute setting with the goals listed below. She will need rehab upon d/c    Follow Up Recommendations  SNF    Equipment Recommendations  3 in 1 bedside commode    Recommendations for Other Services       Precautions / Restrictions Precautions Precautions: Fall Precaution Comments: heavy R lateral lean in sitting and standing Restrictions Weight Bearing Restrictions: No      Mobility Bed Mobility Overal bed mobility: Needs Assistance Bed Mobility: Supine to Sit;Sit to Supine     Supine to sit: +2 for safety/equipment;Max assist;Mod assist Sit to supine: Mod assist;+2 for physical assistance   General bed mobility comments: Pt did initiate movement towards EOB, however needed assist to guide LEs all the way around and also assist to elevate trunk into sitting.  Note heavy R lateral lean in sitting requiring more mod to max A to correct with cues to reach with LUE.  She did initiate a little more movement when getting back into bed and did assist with bridging to scoot over in bed.  Transfers Overall transfer level: Needs assistance Equipment used: Rolling walker (2 wheeled) Transfers: Sit to/from Stand Sit to Stand: +2 physical assistance;Mod assist         General transfer comment: Pt did very well standing on first trial to RW with cues for upright posture.  Note that she does  not seem to place weight all the way on RLE when standing and needs support to prevent overt R lateral trunk flexion.  During second rep, pt did need increased support and was unable to get into fully upright position.    Balance Overall balance assessment: Needs assistance Sitting-balance support: Single extremity supported;Bilateral upper extremity supported;Feet supported Sitting balance-Leahy Scale: Poor Sitting balance - Comments: max fading to min/mod A for sitting balance on EOB. Postural control: Right lateral lean Standing balance support: Bilateral upper extremity supported;During functional activity Standing balance-Leahy Scale: Poor Standing balance comment: mod A with BUE support on RW for standing balance.                           ADL either performed or assessed with clinical judgement   ADL Overall ADL's : Needs assistance/impaired Eating/Feeding: Maximal assistance Eating/Feeding Details (indicate cue type and reason): hand over hand assistance with SLP Grooming: Moderate assistance Grooming Details (indicate cue type and reason): set up to wash hands Upper Body Bathing: Moderate assistance;Maximal assistance   Lower Body Bathing: Moderate assistance;Maximal assistance   Upper Body Dressing : Moderate assistance;Maximal assistance   Lower Body Dressing: Maximal assistance;Total assistance                 General ADL Comments: pt reached down to assist with socks from [facility] raised. She needed assist to maintain balance when sitting EOB.  Pt needs mod cues to continue/sustain attention  to task. She does initiate     Museum/gallery curator      Pertinent Vitals/Pain Pain Assessment: Faces Faces Pain Scale: No hurt     Hand Dominance     Extremity/Trunk Assessment Upper Extremity Assessment Upper Extremity Assessment: Generalized weakness   Lower Extremity Assessment Lower Extremity Assessment: Generalized  weakness;Difficult to assess due to impaired cognition   Cervical / Trunk Assessment Cervical / Trunk Assessment: Kyphotic;Other exceptions Cervical / Trunk Exceptions: Pt with heavy L lateral lean in sitting/standing   Communication Communication Communication: No difficulties   Cognition Arousal/Alertness: Awake/alert Behavior During Therapy: WFL for tasks assessed/performed Overall Cognitive Status: No family/caregiver present to determine baseline cognitive functioning                                 General Comments: Unsure of baseline cognitive deficits. Pt initiates but did not sustain attention to follow through with entire task   General Comments       Exercises     Shoulder Instructions      Home Living Family/patient expects to be discharged to:: Unsure Living Arrangements: Alone                               Additional Comments: Unsure of pts living conditions.  She reports to SLP she lives alone, but reports to PT/OT she lives with sister.      Prior Functioning/Environment Level of Independence: Independent        Comments: Unsure        OT Problem List: Decreased strength;Decreased activity tolerance;Impaired balance (sitting and/or standing);Decreased cognition;Decreased safety awareness;Pain      OT Treatment/Interventions: Self-care/ADL training;Energy conservation;DME and/or AE instruction;Patient/family education;Balance training;Therapeutic activities;Therapeutic exercise;Neuromuscular education    OT Goals(Current goals can be found in the care plan section) Acute Rehab OT Goals Patient Stated Goal: Pt unable to share goals due to cognition OT Goal Formulation: Patient unable to participate in goal setting Time For Goal Achievement: 12/03/18 Potential to Achieve Goals: Fair ADL Goals Pt Will Transfer to Toilet: with mod assist;with +2 assist;bedside commode;stand pivot transfer Additional ADL Goal #1: pt will  perform UB adls with min A and mod cues Additional ADL Goal #2: pt will sit unsupported x 3 minutes with min guard Additional ADL Goal #3: pt will sustain attention x 2 minutes in a quiet environment with 2 cues  OT Frequency: Min 2X/week   Barriers to D/C:            Co-evaluation PT/OT/SLP Co-Evaluation/Treatment: Yes Reason for Co-Treatment: Complexity of the patient's impairments (multi-system involvement) PT goals addressed during session: Mobility/safety with mobility OT goals addressed during session: ADL's and self-care      AM-PAC OT "6 Clicks" Daily Activity     Outcome Measure Help from another person eating meals?: A Lot Help from another person taking care of personal grooming?: A Lot Help from another person toileting, which includes using toliet, bedpan, or urinal?: Total Help from another person bathing (including washing, rinsing, drying)?: A Lot Help from another person to put on and taking off regular upper body clothing?: A Lot Help from another person to put on and taking off regular lower body clothing?: A Lot 6 Click Score: 11   End of Session Nurse Communication: (called for sitter)  Activity Tolerance:  Patient limited by fatigue Patient left: in bed;with call bell/phone within reach;with bed alarm set  OT Visit Diagnosis: Unsteadiness on feet (R26.81);Muscle weakness (generalized) (M62.81)                Time: [id number] OT Time Calculation (min): 29 min Charges:  OT General Charges $OT Visit: 1 Visit OT Evaluation $OT Eval Low Complexity: [place], OTR/L Acute Rehabilitation Services [phone] [facility] pager [phone] office 11/19/2018  [place] 11/19/2018, 3:52 PM

## 2018-11-19 NOTE — Evaluation (Signed)
Clinical/Bedside Swallow Evaluation Patient Details  Name: KAMILIA EBERSOL MRN: [id number] Date of Birth: Apr 01, 1931  Today's Date: 11/19/2018 Time: SLP Start Time (ACUTE ONLY): [id number] SLP Stop Time (ACUTE ONLY): 1258 SLP Time Calculation (min) (ACUTE ONLY): 43 min  Past Medical History:  Past Medical History:  Diagnosis Date  . Anal cancer ([place]) 2004   Non-surgical mgt with chemo-radiation.  Dr Alen Blew follows.   . Anal fissure   . Colon, diverticulosis    Sigmoid. With associated stenosis.  Colonoscopy failed to intubate beyond stenosis in 08/2010.  rectal bx then negative for recurrent cancer.   . Dementia ([place])   . Hypertension   . OA (osteoarthritis)   . Squamous cell carcinoma    skin  . Varicose veins of left lower extremity with pain    Past Surgical History:  Past Surgical History:  Procedure Laterality Date  . ABDOMINAL HYSTERECTOMY    . APPENDECTOMY    . BLADDER REPAIR    . CATARACT EXTRACTION  05/2004    dr Ishmael Holter  . LUMBAR LAMINECTOMY/DECOMPRESSION MICRODISCECTOMY N/A 07/26/2016   Procedure: Central decompression, lumbar laminectomy L4-L5 and L5-S1, foraminotmy L5-S1 and L4-L5 bilateral;  Surgeon: Latanya Maudlin, MD;  Location: [facility]S;  Service: Orthopedics;  Laterality: N/A;  . NEPHRECTOMY  1970's   Rt. "Shrivelled up" ,  no cancer.   Marland Kitchen POLYPECTOMY     HPI:  pt is an 85 yo female adm with AMS, pt found to have UTI.  Pt has h/o dementia *reportedly lives alone, dysphagia, rectal cancer s/p fissure, colitis, acute renal failure and encephalopathy.  Swallow eval ordered as RN reports pt coughing with liquids and spitting out medications.   Assessment / Plan / Recommendation Clinical Impression  Pt presents with left facial asymmetry- uncertain if baseline as no family is present at this time.  Pt does demonstrate clinical indication of dysphagia and concern for aspiration mostly with thin liquids.  Suspect possible airway infiltration due to premature oral spillage of  bolus into airway before swallowing. Adequate clinical tolerance of tsps fhin, nectar and soft solids.  Pt demonstrates decreased sustained attention requiring verbal cues at times to swallow.  Use of hand over hand assistance faciliated motor planning for swallowing.  Cough after solids x2 during entire session - after which pt tapped her hand on her chest. She did not articulate sensation of food lodging - but highly recommend following esophageal precautions. Anticipate her diet modification will only be temporary while pt is managing her acute dysphagia from mentation changes with her UTI.  Educated Tiffany NT and sitter to aspiration precautions. Also requested RN asked family if they should come to baseline facial features, ? left facial droop baseline? Marland Kitchen SLP Visit Diagnosis: Dysphagia, unspecified (R13.10);Dysphagia, oral phase (R13.11)    Aspiration Risk  Moderate aspiration risk    Diet Recommendation Dysphagia 3 (Mech soft);Nectar-thick liquid(tsps thin ok)   Medication Administration: Whole meds with puree(crush if large and not contraindicated, start and follow with liquids) Compensations: Slow rate;Small sips/bites Postural Changes: Seated upright at 90 degrees;Remain upright for at least 30 minutes after po intake    Other  Recommendations Oral Care Recommendations: Oral care QID   Follow up Recommendations None      Frequency and Duration min 1 x/week  1 week       Prognosis Barriers to Reach Goals: Cognitive deficits      Swallow Study   General Date of Onset: 11/19/18 HPI: pt is an 85 yo female adm with  AMS, pt found to have UTI.  Pt has h/o dementia *reportedly lives alone, dysphagia, rectal cancer s/p fissure, colitis, acute renal failure and encephalopathy.  Swallow eval ordered as RN reports pt coughing with liquids and spitting out medications. Type of Study: Bedside Swallow Evaluation Previous Swallow Assessment: 06/2018 BSE, recommend reg/thin - full supervision  while mentation decreased Diet Prior to this Study: Regular;Thin liquids Temperature Spikes Noted: No Respiratory Status: Room air History of Recent Intubation: No Behavior/Cognition: Alert;Cooperative Oral Cavity Assessment: Within Functional Limits Oral Care Completed by SLP: No Oral Cavity - Dentition: Dentures, top;Dentures, bottom Vision: Functional for self-feeding Self-Feeding Abilities: Needs assist(hand over hand assist) Patient Positioning: Upright in bed Baseline Vocal Quality: Normal Volitional Cough: Strong Volitional Swallow: Able to elicit    Oral/Motor/Sensory Function Overall Oral Motor/Sensory Function: (LEFT FACIAL ASYMMETRY) ---  This was not documented on swallow evaluation in April 2020 by SLP, therefore question if a change since that time.   Ice Chips Ice chips: Not tested   Thin Liquid Thin Liquid: Impaired Presentation: Cup;Self Fed;Straw;Spoon Pharyngeal  Phase Impairments: Cough - Immediate Other Comments: controlled amounts to tsp clinically improved airway protection, thus recommend thin via tsp    Nectar Thick Nectar Thick Liquid: Within functional limits Presentation: Self Fed;Straw   Honey Thick Honey Thick Liquid: Not tested   Puree Puree: Within functional limits Presentation: Spoon   Solid     Solid: Impaired Pharyngeal Phase Impairments: Cough - Delayed Other Comments: delayed subtle cough noted x2 after intake of solids and pt placed her hand at her chest - she did not articulate if she sensed residuals in the esophagus or the pharynx, decreased lengthier sustained attention      Macario Golds 11/19/2018,2:02 PM    Luanna Salk, [place] [facility] SLP [place] Pager [phone] Office [phone]

## 2018-11-20 DIAGNOSIS — R531 Weakness: Secondary | ICD-10-CM

## 2018-11-20 DIAGNOSIS — F0390 Unspecified dementia without behavioral disturbance: Secondary | ICD-10-CM

## 2018-11-20 DIAGNOSIS — G9341 Metabolic encephalopathy: Secondary | ICD-10-CM

## 2018-11-20 DIAGNOSIS — I10 Essential (primary) hypertension: Secondary | ICD-10-CM

## 2018-11-20 LAB — COMPREHENSIVE METABOLIC PANEL
ALT: 21 U/L (ref 0–44)
AST: 15 U/L (ref 15–41)
Albumin: 3.1 g/dL — ABNORMAL LOW (ref 3.5–5.0)
Alkaline Phosphatase: 76 U/L (ref 38–126)
Anion gap: 8 (ref 5–15)
BUN: 33 mg/dL — ABNORMAL HIGH (ref 8–23)
CO2: 21 mmol/L — ABNORMAL LOW (ref 22–32)
Calcium: 10 mg/dL (ref 8.9–10.3)
Chloride: 108 mmol/L (ref 98–111)
Creatinine, Ser: 1.35 mg/dL — ABNORMAL HIGH (ref 0.44–1.00)
GFR calc Af Amer: 42 mL/min — ABNORMAL LOW (ref >60)
GFR calc non Af Amer: 36 mL/min — ABNORMAL LOW (ref >60)
Glucose, Bld: 95 mg/dL (ref 70–99)
Potassium: 4.5 mmol/L (ref 3.5–5.1)
Sodium: 137 mmol/L (ref 135–145)
Total Bilirubin: 0.3 mg/dL (ref 0.3–1.2)
Total Protein: 5.9 g/dL — ABNORMAL LOW (ref 6.5–8.1)

## 2018-11-20 LAB — CBC
HCT: 30.5 % — ABNORMAL LOW (ref 36.0–46.0)
Hemoglobin: 10.1 g/dL — ABNORMAL LOW (ref 12.0–15.0)
MCH: 29.4 pg (ref 26.0–34.0)
MCHC: 33.1 g/dL (ref 30.0–36.0)
MCV: 88.7 fL (ref 80.0–100.0)
Platelets: 188 10*3/uL (ref 150–400)
RBC: 3.44 MIL/uL — ABNORMAL LOW (ref 3.87–5.11)
RDW: 14.9 % (ref 11.5–15.5)
WBC: 5.1 10*3/uL (ref 4.0–10.5)
nRBC: 0 % (ref 0.0–0.2)

## 2018-11-20 LAB — MAGNESIUM: Magnesium: 1.9 mg/dL (ref 1.7–2.4)

## 2018-11-20 NOTE — Progress Notes (Signed)
  Speech Language Pathology Treatment: Dysphagia  Patient Details Name: Shannon Blair MRN: [id number] DOB: [birth year] Today's Date: 11/20/2018 Time: 1510-1520 SLP Time Calculation (min) (ACUTE ONLY): 10 min  Assessment / Plan / Recommendation Clinical Impression  Nurse tech feeding pt and educated to appropriateness to use straw with pt per her direct question; educated pt to clinical reasoning for diet recommendations and strategies.  Subtle cough x1 after solids - pt frequently stares off thus SLP turned off tv to allow her to focus on task at hand.    Indication of aspiration with nectar liquids and overall decent tolerance; again hopeful for improved swallow function as pt medically recovers and diet restrictions may be lifted; left thin gingerale in pt's room and instructed NT to provide her some after meal if pt wants.  Will continue to follow.    HPI HPI: pt is an 85 yo female adm with AMS, pt found to have UTI.  Pt has h/o dementia *reportedly lives alone, dysphagia, rectal cancer s/p fissure, colitis, acute renal failure and encephalopathy.  Swallow eval ordered as RN reports pt coughing with liquids and spitting out medications.      SLP Plan  Continue with current plan of care       Recommendations  Diet recommendations: Dysphagia 3 (mechanical soft);Nectar-thick liquid Medication Administration: Whole meds with puree Supervision: Patient able to self feed Compensations: Slow rate;Small sips/bites Postural Changes and/or Swallow Maneuvers: Seated upright 90 degrees;Upright 30-60 min after meal                Oral Care Recommendations: Oral care QID Follow up Recommendations: None SLP Visit Diagnosis: Dysphagia, unspecified (R13.10);Dysphagia, oral phase (R13.11) Plan: Continue with current plan of care       GO                Macario Golds 11/20/2018, 4:00 PM   Luanna Salk, [place] [facility] SLP Acute Rehab Services Pager [phone] Office  [phone]

## 2018-11-20 NOTE — Progress Notes (Signed)
PROGRESS NOTE    [name]  [id number] DOB: [birth year] DOA: 11/18/2018 PCP: Merrilee Seashore, MD    Brief Narrative:  Shannon Blair a 85 y.o.femalewith medical history significant ofdementia, hypertension, OA, diverticulosis,was brought in by family for worsening weakness, fatigue, urinary incontinence and increasing confusion since 2 days patient has dementia at baseline but family reports she is more confused than baseline. No history of fevers or chills. Patient is a poor historian is alert and oriented to person only on my exam. Most of the history is obtained from the ED note and family. No history of nausea or vomiting or diarrhea. Patient denies any chest pain or shortness of breath.  ED Course:On arrival to ED she was afebrile slightly tachypneic blood pressure of 140/48. Labs reveal sodium of 133, creatinine of 2.09, BUN of 55, hemoglobin of 11.1. COVID-19 screening is negative, urine analysis shows large leukocytes and bacteria. Urine cultures are sent and pending head without contrast does not show any acute intracranial abnormalities. She was given a fluid bolus normal saline 1 L and was started on Rocephin and was referred to [facility] for admission for evaluation of acute encephalopathy and urinary tract infection.   Assessment & Plan:   Principal Problem:   Acute metabolic encephalopathy Active Problems:   HTN (hypertension)   Dementia (HCC)   UTI (urinary tract infection)   ARF (acute renal failure) (HCC)   Weakness generalized   1 acute metabolic encephalopathy secondary to UTI in the setting of dementia Patient presented with worsening confusion than baseline.  Patient with no fevers or chills.  Patient noted to be afebrile.  Urinalysis done on admission concerning for UTI.  Urine cultures with no growth to date.  Patient improving clinically however baseline unknown.  Continue IV Rocephin and likely transition to oral antibiotics tomorrow.   2.  Acute kidney injury Baseline creatinine 1.3.  Creatinine went up as high as 2.09.  Improving with hydration.  Follow.  3.  UTI Urine cultures negative.  Patient however improving clinically.  Continue IV Rocephin and likely transition to oral antibiotics tomorrow.  4.  Generalized weakness PT/OT.  Continue treatment for UTI.  IV fluids.  Supportive care.  5.  Hypertension Blood pressure stable.  6.  Facial rash Noticed after eating tilapia done on 11/18/2018.  Patient with no difficulty swallowing or angioedema.  No itching.  Continue Benadryl.  Follow.  7.  Mild hyponatremia Likely secondary to volume depletion.  Improved with hydration.  Follow.  8.  Dehydration Continue gentle hydration.  Follow.  9.  Dementia Continue home regimen of Aricept and Namenda.   DVT prophylaxis: Lovenox Code Status: Full Family Communication: Updated patient.  No family at bedside. Disposition Plan: To be determined.  SNF versus home with 24-hour supervision.   Consultants:   None  Procedures:   CT head without contrast 11/18/2018    Antimicrobials:  IV Rocephin 11/18/2018   Subjective: Patient sleeping however easily arousable.  Patient alert to self and place.  Denies any chest pain or shortness of breath.  Following commands appropriately.  Objective: Vitals:   11/19/18 0632 11/19/18 1421 11/19/18 2207 11/20/18 0552  BP: (!) 141/56 (!) 150/64 (!) 158/75 (!) 152/86  Pulse: 88 70 72 89  Resp: 18 18 17 17   Temp: 98.5 F (36.9 C) 98 F (36.7 C) 98.8 F (37.1 C) 98.7 F (37.1 C)  TempSrc: Oral Oral Oral Oral  SpO2: 100% 98% 97% 92%  Weight:    63 kg  Height:        Intake/Output Summary (Last 24 hours) at 11/20/2018 1832 Last data filed at 11/20/2018 1500 Gross per 24 hour  Intake 2912.12 ml  Output 1200 ml  Net 1712.12 ml   Filed Weights   11/20/18 0552  Weight: 63 kg    Examination:  General exam: Appears calm and comfortable  Respiratory system: Clear to  auscultation. Respiratory effort normal. Cardiovascular system: S1 & S2 heard, RRR. No JVD, murmurs, rubs, gallops or clicks. No pedal edema. Gastrointestinal system: Abdomen is nondistended, soft and nontender. No organomegaly or masses felt. Normal bowel sounds heard. Central nervous system: Alert and oriented to self and place. No focal neurological deficits. Extremities: Symmetric 5 x 5 power. Skin: No rashes, lesions or ulcers Psychiatry: Judgement and insight appear normal. Mood & affect appropriate.     Data Reviewed: I have personally reviewed following labs and imaging studies  CBC: Recent Labs  Lab 11/18/18 1211 11/19/18 0901 11/20/18 0620  WBC 7.6 5.4 5.1  NEUTROABS 5.3  --   --   HGB 11.1* 10.7* 10.1*  HCT 32.3* 31.8* 30.5*  MCV 89.0 89.1 88.7  PLT 173 185 0000000   Basic Metabolic Panel: Recent Labs  Lab 11/18/18 1211 11/19/18 0901 11/20/18 0620  NA 133* 136 137  K 4.4 4.6 4.5  CL 102 107 108  CO2 22 20* 21*  GLUCOSE 101* 109* 95  BUN 55* 41* 33*  CREATININE 2.09* 1.58* 1.35*  CALCIUM 10.1 10.1 10.0  MG  --  2.3 1.9   GFR: Estimated Creatinine Clearance: 26.8 mL/min (A) (by C-G formula based on SCr of 1.35 mg/dL (H)). Liver Function Tests: Recent Labs  Lab 11/18/18 1211 11/19/18 0901 11/20/18 0620  AST 19 19 15   ALT 26 25 21   ALKPHOS 79 84 76  BILITOT 0.5 0.4 0.3  PROT 6.5 6.4* 5.9*  ALBUMIN 3.5 3.4* 3.1*   No results for input(s): LIPASE, AMYLASE in the last 168 hours. No results for input(s): AMMONIA in the last 168 hours. Coagulation Profile: No results for input(s): INR, PROTIME in the last 168 hours. Cardiac Enzymes: No results for input(s): CKTOTAL, CKMB, CKMBINDEX, TROPONINI in the last 168 hours. BNP (last 3 results) No results for input(s): PROBNP in the last 8760 hours. HbA1C: No results for input(s): HGBA1C in the last 72 hours. CBG: No results for input(s): GLUCAP in the last 168 hours. Lipid Profile: No results for input(s):  CHOL, HDL, LDLCALC, TRIG, CHOLHDL, LDLDIRECT in the last 72 hours. Thyroid Function Tests: No results for input(s): TSH, T4TOTAL, FREET4, T3FREE, THYROIDAB in the last 72 hours. Anemia Panel: No results for input(s): VITAMINB12, FOLATE, FERRITIN, TIBC, IRON, RETICCTPCT in the last 72 hours. Sepsis Labs: No results for input(s): PROCALCITON, LATICACIDVEN in the last 168 hours.  Recent Results (from the past 240 hour(s))  Urine culture     Status: None   Collection Time: 11/18/18 11:52 AM   Specimen: Urine, Clean Catch  Result Value Ref Range Status   Specimen Description   Final    URINE, CLEAN CATCH Performed at [facility], [place] [address] [place], [place] [address]    Special Requests   Final    NONE Performed at [facility], [place] [address] [place], [place] [address]    Culture   Final    NO GROWTH Performed at [facility], [place] [address], [place], [place] [address]    Report Status 11/19/2018 FINAL  Final  SARS Coronavirus 2 [facility] order, Performed in [facility] hospital lab)     Status: None   Collection Time: 11/18/18 12:32 PM  Result Value Ref Range Status   SARS Coronavirus 2 NEGATIVE NEGATIVE Final    Comment: (NOTE) If result is NEGATIVE SARS-CoV-2 target nucleic acids are NOT DETECTED. The SARS-CoV-2 RNA is generally detectable in upper and lower  respiratory specimens during the acute phase of infection. The lowest  concentration of SARS-CoV-2 viral copies this assay can detect is 250  copies / mL. A negative result does not preclude SARS-CoV-2 infection  and should not be used as the sole basis for treatment or other  patient management decisions.  A negative result may occur with  improper specimen collection / handling, submission of specimen other  than nasopharyngeal swab, presence of viral mutation(s) within the  areas targeted by this assay, and inadequate number of viral copies  (<250 copies / mL). A  negative result must be combined with clinical  observations, patient history, and epidemiological information. If result is POSITIVE SARS-CoV-2 target nucleic acids are DETECTED. The SARS-CoV-2 RNA is generally detectable in upper and lower  respiratory specimens dur ing the acute phase of infection.  Positive  results are indicative of active infection with SARS-CoV-2.  Clinical  correlation with patient history and other diagnostic information is  necessary to determine patient infection status.  Positive results do  not rule out bacterial infection or co-infection with other viruses. If result is PRESUMPTIVE POSTIVE SARS-CoV-2 nucleic acids MAY BE PRESENT.   A presumptive positive result was obtained on the submitted specimen  and confirmed on repeat testing.  While 2019 novel coronavirus  (SARS-CoV-2) nucleic acids may be present in the submitted sample  additional confirmatory testing may be necessary for epidemiological  and / or clinical management purposes  to differentiate between  SARS-CoV-2 and other Sarbecovirus currently known to infect humans.  If clinically indicated additional testing with an alternate test  methodology [phone]) is advised. The SARS-CoV-2 RNA is generally  detectable in upper and lower respiratory sp ecimens during the acute  phase of infection. The expected result is Negative. Fact Sheet for Patients:  StrictlyIdeas.no Fact Sheet for Healthcare Providers: BankingDealers.co.za This test is not yet approved or cleared by the [place] FDA and has been authorized for detection and/or diagnosis of SARS-CoV-2 by FDA under an Emergency Use Authorization (EUA).  This EUA will remain in effect (meaning this test can be used) for the duration of the COVID-19 declaration under Section 564(b)(1) of the Act, 21 U.S.C. section 360bbb-3(b)(1), unless the authorization is terminated or revoked sooner. Performed  at [facility], [place] [address] [place], [place] [address]          Radiology Studies: No results found.      Scheduled Meds: . aspirin EC  81 mg Oral Daily  . donepezil  10 mg Oral QHS  . enoxaparin (LOVENOX) injection  30 mg Subcutaneous Q24H  . feeding supplement (ENSURE ENLIVE)  237 mL Oral BID BM  . memantine  10 mg Oral BID  . oxybutynin  5 mg Oral BID  . trimethoprim  100 mg Oral Daily  . venlafaxine XR  75 mg Oral Q breakfast  . vitamin B-12  1,000 mcg Oral Daily   Continuous Infusions: . cefTRIAXone (ROCEPHIN)  IV 1 g (11/20/18 1642)  . lactated ringers 75 mL/hr at 11/19/18 2153     LOS: 2 days    Time spent: 46  minutes    Irine Seal, MD [facility]  If 7PM-7AM, please contact night-coverage [email or web address] 11/20/2018, 6:32 PM

## 2018-11-20 NOTE — TOC Initial Note (Signed)
Transition of Care [facility]) - Initial/Assessment Note    Patient Details  Name: Shannon Blair MRN: [id number] Date of Birth: 1931-04-10  [facility] CM/SW Contact:    Nila Nephew, LCSW Phone Number: [phone] 11/20/2018, 10:12 AM  Clinical Narrative:      Pt admitted with UTI from home where she resides with he sister. Known to CSW from previous hospital admission 07/2018. Pt discharged from that stay to [facility] SNF for short term rehab, returned home 08/22/2018. Pt with dementia and at baseline typically knows her family/caregivers and surroundings, needs prompting to take medications, meals, and bathe, and she ambulates with a rollater with family assists with transferring. Pt also has a transfer chair.  Since last admission, pt's nephew has moved in with her to supervise/assist 24/7. Sister also spends most days with her. Discussed SNF recommendation and family is declining at this time. States pt's functional status typically improves quickly after UTI treatment and they are hoping that is the case this time. Also declining home health.  CSW also discussed speech therapist interventions and sister states she "has been a dementia caregiver and feels confident with pureed diets." Family state they will check back in with CSW but at this time decline any after care needs.                 Patient Goals and CMS Choice    Home- Self care (family care)    Expected Discharge Plan and Services           Expected Discharge Date: (unknown)                                    Prior Living Arrangements/Services             Single Family Home          Activities of Daily Living Home Assistive Devices/Equipment: Eyeglasses, Radio producer (specify quad or straight), Walker (specify type), Dentures (specify type)(rollator, single point cane, upper/lower partial plates) ADL Screening (condition at time of admission) Patient's cognitive ability adequate to safely  complete daily activities?: No Is the patient deaf or have difficulty hearing?: No Does the patient have difficulty seeing, even when wearing glasses/contacts?: No Does the patient have difficulty concentrating, remembering, or making decisions?: Yes Patient able to express need for assistance with ADLs?: Yes Does the patient have difficulty dressing or bathing?: No Independently performs ADLs?: Yes (appropriate for developmental age) Does the patient have difficulty walking or climbing stairs?: Yes Weakness of Legs: Both Weakness of Arms/Hands: None  Permission Sought/Granted        [phone] Sister Arville Go (facesheet lists her as legal guardian but she reports this is inaccurate- she is POA)            Admission diagnosis:  Ataxia [R27.0] Pyelonephritis [N12] AKI (acute kidney injury) ([place]) [N17.9] Patient Active Problem List   Diagnosis Date Noted  . Acute encephalopathy 08/06/2018  . Pyelonephritis 08/06/2018  . ARF (acute renal failure) ([place]) 08/06/2018  . Hydronephrosis 08/06/2018  . UTI (urinary tract infection) 07/06/2018  . Dementia ([place])   . Spinal stenosis, lumbar region with neurogenic claudication 07/26/2016  . Pseudoaphakia 11/13/2011  . Nonspecific (abnormal) findings on radiological and other examination of gastrointestinal tract 06/26/2011  . Colitis 06/25/2011  . Rectal bleeding 06/25/2011  . History of rectal or anal cancer 06/25/2011  . HTN (hypertension) 06/25/2011   PCP:  Ashby Dawes,  Mauro Kaufmann, MD Pharmacy:   [id number] - [place], [place] [id number] [place] [id number] Phone: [phone] Fax: [phone]     Social Determinants of Health (SDOH) Interventions    Readmission Risk Interventions No flowsheet data found.

## 2018-11-21 LAB — CBC WITH DIFFERENTIAL/PLATELET
Abs Immature Granulocytes: 0.12 10*3/uL — ABNORMAL HIGH (ref 0.00–0.07)
Basophils Absolute: 0.1 10*3/uL (ref 0.0–0.1)
Basophils Relative: 1 %
Eosinophils Absolute: 0.5 10*3/uL (ref 0.0–0.5)
Eosinophils Relative: 9 %
HCT: 33.6 % — ABNORMAL LOW (ref 36.0–46.0)
Hemoglobin: 11.3 g/dL — ABNORMAL LOW (ref 12.0–15.0)
Immature Granulocytes: 2 %
Lymphocytes Relative: 24 %
Lymphs Abs: 1.2 10*3/uL (ref 0.7–4.0)
MCH: 29.8 pg (ref 26.0–34.0)
MCHC: 33.6 g/dL (ref 30.0–36.0)
MCV: 88.7 fL (ref 80.0–100.0)
Monocytes Absolute: 0.4 10*3/uL (ref 0.1–1.0)
Monocytes Relative: 8 %
Neutro Abs: 2.9 10*3/uL (ref 1.7–7.7)
Neutrophils Relative %: 56 %
Platelets: 207 10*3/uL (ref 150–400)
RBC: 3.79 MIL/uL — ABNORMAL LOW (ref 3.87–5.11)
RDW: 14.8 % (ref 11.5–15.5)
WBC: 5.2 10*3/uL (ref 4.0–10.5)
nRBC: 0 % (ref 0.0–0.2)

## 2018-11-21 LAB — BASIC METABOLIC PANEL
Anion gap: 8 (ref 5–15)
BUN: 27 mg/dL — ABNORMAL HIGH (ref 8–23)
CO2: 22 mmol/L (ref 22–32)
Calcium: 10.3 mg/dL (ref 8.9–10.3)
Chloride: 108 mmol/L (ref 98–111)
Creatinine, Ser: 1.23 mg/dL — ABNORMAL HIGH (ref 0.44–1.00)
GFR calc Af Amer: 47 mL/min — ABNORMAL LOW (ref >60)
GFR calc non Af Amer: 40 mL/min — ABNORMAL LOW (ref >60)
Glucose, Bld: 91 mg/dL (ref 70–99)
Potassium: 4.2 mmol/L (ref 3.5–5.1)
Sodium: 138 mmol/L (ref 135–145)

## 2018-11-21 MED ORDER — TRIMETHOPRIM 100 MG PO TABS: 100.0000 mg | ORAL_TABLET | Freq: Every day | ORAL | Status: DC

## 2018-11-21 MED ORDER — CEFDINIR 300 MG PO CAPS: 300.0000 mg | ORAL_CAPSULE | ORAL | 0 refills | Status: AC

## 2018-11-21 MED ORDER — AMLODIPINE BESYLATE 2.5 MG PO TABS: 2.5000 mg | ORAL_TABLET | Freq: Every day | ORAL | 1 refills | Status: DC

## 2018-11-21 MED ORDER — CEFDINIR 300 MG PO CAPS
300.0000 mg | ORAL_CAPSULE | ORAL | Status: DC
  Administered 2018-11-21: 11:00:00 300 mg via ORAL
  Filled 2018-11-21: qty 1

## 2018-11-21 MED ORDER — AMLODIPINE BESYLATE 5 MG PO TABS
2.5000 mg | ORAL_TABLET | Freq: Every day | ORAL | Status: DC
  Administered 2018-11-21: 14:00:00 2.5 mg via ORAL
  Filled 2018-11-21: qty 1

## 2018-11-21 MED ORDER — RESOURCE THICKENUP CLEAR PO POWD: ORAL | 0 refills | Status: AC

## 2018-11-21 NOTE — Discharge Summary (Signed)
Physician Discharge Summary  Shannon Blair [id number] DOB: May 11, 1931 DOA: 11/18/2018  PCP: Shannon Seashore, MD  Admit date: 11/18/2018 Discharge date: 11/21/2018  Time spent: 55 minutes  Recommendations for Outpatient Follow-up:  1. Follow-up with Shannon Seashore, MD in 1 to 2 weeks.  On follow-up patient needs a basic metabolic profile done to follow-up on electrolytes and renal function.  Patient's blood pressure also need to be reassessed as patient's ARB/diuretic have been discontinued and discharging patient started on Norvasc 2.5 mg daily.    Discharge Diagnoses:  Principal Problem:   Acute metabolic encephalopathy Active Problems:   HTN (hypertension)   Dementia (HCC)   UTI (urinary tract infection)   AKI (acute kidney injury) ([place])   ARF (acute renal failure) (HCC)   Weakness generalized   Discharge Condition: Stable and improved.  Diet recommendation: Dysphagia 3 diet with nectar thick liquids.  Filed Weights   11/20/18 0552  Weight: 63 kg    History of present illness:  HPI per Dr. Perry Mount is a 85 y.o. female with medical history significant of dementia, hypertension, OA, diverticulosis, was brought in by family for worsening weakness, fatigue, urinary incontinence and increasing confusion since 2 days patient has dementia at baseline but family reports she is more confused than baseline.  No history of fevers or chills.  Patient is a poor historian is alert and oriented to person only on my exam.  Most of the history is obtained from the ED note and family.  No history of nausea or vomiting or diarrhea.  Patient denies any chest pain or shortness of breath.  ED Course: On arrival to ED she was afebrile slightly tachypneic blood pressure of 140/48. Labs reveal sodium of 133, creatinine of 2.09, BUN of 55, hemoglobin of 11.1.  COVID-19 screening is pending, urine analysis shows large leukocytes and bacteria.  Urine cultures are sent and  pending head without contrast does not show any acute intracranial abnormalities.  She was given a fluid bolus normal saline 1 L and was started on Rocephin and was referred to [facility] for admission for evaluation of acute encephalopathy and urinary tract infection  Hospital Course:  1 acute metabolic encephalopathy secondary to UTI in the setting of dementia Patient presented with worsening confusion than baseline.  Patient with no fevers or chills.  Patient noted to be afebrile.  Urinalysis done on admission concerning for UTI.  Urine cultures with no growth to date.  Patient was hydrated IV fluids and placed on IV Rocephin.  Patient improved clinically and subsequently transition to oral Vantin.  Patient be discharged home on 2 more days of oral Vantin to complete a 5-day course of antibiotic treatment.  Outpatient follow-up with PCP.  2.  Acute kidney injury Baseline creatinine 1.3.  Creatinine went up as high as 2.09.  Patient's acute kidney injury felt secondary to a prerenal azotemia in the setting of ARB and diuretic.  ARB and diuretic were held.  Patient hydrated with IV fluids and renal function improved and was back to baseline by day of discharge.  Patient's ARB and diuretic been discontinued on discharge.  On day of discharge creatinine was down to 1.23.  Outpatient follow-up with PCP.   3.  UTI Patient admitted with concerns for UTI.  Patient placed on IV Rocephin and follow.  Patient improved clinically.  Urine cultures came back negative.  Patient transition to oral Vantin will be discharged home on 2 more days of oral Vantin to complete  a 5-day course of antibiotic treatment.  After patient's Bennie Pierini has been completed patient will be resumed back on home regimen of trimethoprim.  4.  Generalized weakness Patient seen by PT/OT.  Patient hydrated with IV fluids and treated with antibiotics for UTI.  PT OT had recommended SNF however family refused.  Home health was ordered and family  refused.  Outpatient follow-up with PCP.   5.  Hypertension Patient's antihypertensive medications were held as patient was noted to be in acute kidney injury on admission on ARB and diuretic.  As patient was hydrated and was euvolemic patient was subsequently started on Norvasc 2.5 mg daily.  Patient's ARB and diuretic are discontinued on discharge.  Outpatient follow-up with PCP.    6.  Facial rash Noticed after eating tilapia done on 11/18/2018.  Patient with no difficulty swallowing or angioedema.  No itching.    Patient was placed on Benadryl as needed.  Patient improved clinically.   7.  Mild hyponatremia Likely secondary to volume depletion.  Resolved with hydration.   8.  Dehydration Patient hydrated with IV fluids and was euvolemic by day of discharge.   9.  Dementia Patient was maintained on home regimen of Aricept and Namenda.  Procedures:  CT head without contrast 11/18/2018  Consultations:  None  Discharge Exam: Vitals:   11/21/18 0650 11/21/18 1316  BP: (!) 165/65 (!) 155/69  Pulse: (!) 53 65  Resp: 20 18  Temp: 98 F (36.7 C) 98 F (36.7 C)  SpO2: 91% 95%    General: NAD Cardiovascular: RRR Respiratory: CTAB  Discharge Instructions   Discharge Instructions    Diet general   Complete by: As directed    Dysphagia 3 diet with nectar thick liquids   Increase activity slowly   Complete by: As directed      Allergies as of 11/21/2018      Reactions   Tilapia [fish Allergy]    Facial rash      Medication List    STOP taking these medications   amoxicillin-clavulanate 875-125 MG tablet Commonly known as: AUGMENTIN   ibuprofen 200 MG tablet Commonly known as: ADVIL   irbesartan-hydrochlorothiazide 300-12.5 MG tablet Commonly known as: AVALIDE   sodium bicarbonate 650 MG tablet     TAKE these medications   acetaminophen 325 MG tablet Commonly known as: TYLENOL Take 650 mg by mouth every 6 (six) hours as needed for mild pain.    amLODipine 2.5 MG tablet Commonly known as: NORVASC Take 1 tablet (2.5 mg total) by mouth daily. Start taking on: November 22, 2018   aspirin EC 81 MG tablet Take 81 mg by mouth daily.   cefdinir 300 MG capsule Commonly known as: OMNICEF Take 1 capsule (300 mg total) by mouth daily for 2 days. Start taking on: November 22, 2018   docusate sodium 100 MG capsule Commonly known as: COLACE Take 100 mg by mouth at bedtime.   donepezil 10 MG tablet Commonly known as: ARICEPT Take 10 mg by mouth at bedtime.   feeding supplement (ENSURE ENLIVE) Liqd Take 237 mLs by mouth 2 (two) times daily between meals.   Fish Oil 1000 MG Caps Take 1 capsule by mouth daily.   memantine 10 MG tablet Commonly known as: NAMENDA Take 10 mg by mouth 2 (two) times daily.   multivitamin with minerals Tabs tablet Take 1 tablet by mouth daily.   oxybutynin 5 MG tablet Commonly known as: DITROPAN Take 5 mg by mouth 2 (two) times daily.  Red Yeast Rice 600 MG Tabs Take 600 mg by mouth daily.   Resource ThickenUp Clear Powd 1 scoop  For 4 oz liquid, 2 scoops for eight oz and add liquids.   trimethoprim 100 MG tablet Commonly known as: TRIMPEX Take 1 tablet (100 mg total) by mouth daily. Start taking on: November 24, 2018 What changed: These instructions start on November 24, 2018. If you are unsure what to do until then, ask your doctor or other care provider.   venlafaxine XR 75 MG 24 hr capsule Commonly known as: EFFEXOR-XR Take 75 mg by mouth daily with breakfast.   vitamin B-12 1000 MCG tablet Commonly known as: CYANOCOBALAMIN Take 1,000 mcg by mouth daily.   vitamin C 500 MG tablet Commonly known as: ASCORBIC ACID Take 500 mg by mouth daily.   Vitamin D3 50 MCG (2000 UT) capsule Take 2,000 Units by mouth daily.   VITAMIN E PO Take 1 tablet by mouth daily.            Durable Medical Equipment  (From admission, onward)         Start     Ordered   11/20/18 0850  For  home use only DME 4 wheeled rolling walker with seat  Once    Question:  Patient needs a walker to treat with the following condition  Answer:  Debility   11/20/18 0849         Allergies  Allergen Reactions  . Tilapia [Fish Allergy]     Facial rash   Follow-up Information    Shannon Seashore, MD. Schedule an appointment as soon as possible for a visit in 1 week(s).   Specialty: Internal Medicine Why: f/u in 1-2 weeks. Contact information: [address] [place] [address] [phone]            The results of significant diagnostics from this hospitalization (including imaging, microbiology, ancillary and laboratory) are listed below for reference.    Significant Diagnostic Studies: Ct Head Wo Contrast  Result Date: 11/18/2018 CLINICAL DATA:  HPI Level 5 caveat for severe dementia. 85 year old female comes in a chief complaint of weakness. Patient has dementia. According to patient's friend, who is also listed as legal guardian, patient started getting worse on Thursday. EXAM: CT HEAD WITHOUT CONTRAST TECHNIQUE: Contiguous axial images were obtained from the base of the skull through the vertex without intravenous contrast. COMPARISON:  08/06/2018 FINDINGS: Brain: There is central and cortical atrophy. Severe periventricular white matter changes are consistent small vessel disease and stable in appearance. Small remote lacunar infarct of the RIGHT basal ganglia. There is no intra or extra-axial fluid collection or mass lesion. The basilar cisterns and ventricles have a normal appearance. There is no CT evidence for acute infarction or hemorrhage. Vascular: There is dense atherosclerotic calcification of the internal carotid arteries. No hyperdense vessels. Skull: Normal. Negative for fracture or focal lesion. Sinuses/Orbits: No acute finding. Other: None. IMPRESSION: 1. Atrophy and small vessel disease, stable. 2. No evidence for acute intracranial abnormality.  Electronically Signed   By: Nolon Nations M.D.   On: 11/18/2018 16:43    Microbiology: Recent Results (from the past 240 hour(s))  Urine culture     Status: None   Collection Time: 11/18/18 11:52 AM   Specimen: Urine, Clean Catch  Result Value Ref Range Status   Specimen Description   Final    URINE, CLEAN CATCH Performed at [facility], [place] [address] [place], [place] [address]  Special Requests   Final    NONE Performed at [facility], [place] [address], [place], [place] [address]    Culture   Final    NO GROWTH Performed at [facility], [place] [address] [place], [place] [address]    Report Status 11/19/2018 FINAL  Final  SARS Coronavirus 2 [facility] order, Performed in [facility] hospital lab)     Status: None   Collection Time: 11/18/18 12:32 PM  Result Value Ref Range Status   SARS Coronavirus 2 NEGATIVE NEGATIVE Final    Comment: (NOTE) If result is NEGATIVE SARS-CoV-2 target nucleic acids are NOT DETECTED. The SARS-CoV-2 RNA is generally detectable in upper and lower  respiratory specimens during the acute phase of infection. The lowest  concentration of SARS-CoV-2 viral copies this assay can detect is 250  copies / mL. A negative result does not preclude SARS-CoV-2 infection  and should not be used as the sole basis for treatment or other  patient management decisions.  A negative result may occur with  improper specimen collection / handling, submission of specimen other  than nasopharyngeal swab, presence of viral mutation(s) within the  areas targeted by this assay, and inadequate number of viral copies  (<250 copies / mL). A negative result must be combined with clinical  observations, patient history, and epidemiological information. If result is POSITIVE SARS-CoV-2 target nucleic acids are DETECTED. The SARS-CoV-2 RNA is generally detectable in upper and lower  respiratory specimens dur ing the acute phase of  infection.  Positive  results are indicative of active infection with SARS-CoV-2.  Clinical  correlation with patient history and other diagnostic information is  necessary to determine patient infection status.  Positive results do  not rule out bacterial infection or co-infection with other viruses. If result is PRESUMPTIVE POSTIVE SARS-CoV-2 nucleic acids MAY BE PRESENT.   A presumptive positive result was obtained on the submitted specimen  and confirmed on repeat testing.  While 2019 novel coronavirus  (SARS-CoV-2) nucleic acids may be present in the submitted sample  additional confirmatory testing may be necessary for epidemiological  and / or clinical management purposes  to differentiate between  SARS-CoV-2 and other Sarbecovirus currently known to infect humans.  If clinically indicated additional testing with an alternate test  methodology [phone]) is advised. The SARS-CoV-2 RNA is generally  detectable in upper and lower respiratory sp ecimens during the acute  phase of infection. The expected result is Negative. Fact Sheet for Patients:  StrictlyIdeas.no Fact Sheet for Healthcare Providers: BankingDealers.co.za This test is not yet approved or cleared by the [place] FDA and has been authorized for detection and/or diagnosis of SARS-CoV-2 by FDA under an Emergency Use Authorization (EUA).  This EUA will remain in effect (meaning this test can be used) for the duration of the COVID-19 declaration under Section 564(b)(1) of the Act, 21 U.S.C. section 360bbb-3(b)(1), unless the authorization is terminated or revoked sooner. Performed at [facility], [place] [address], [place],  [address]      Labs: Basic Metabolic Panel: Recent Labs  Lab 11/18/18 1211 11/19/18 0901 11/20/18 0620 11/21/18 0628  NA 133* 136 137 138  K 4.4 4.6 4.5 4.2  CL 102 107 108 108  CO2 22 20* 21* 22  GLUCOSE 101*  109* 95 91  BUN 55* 41* 33* 27*  CREATININE 2.09* 1.58* 1.35* 1.23*  CALCIUM 10.1 10.1 10.0 10.3  MG  --  2.3 1.9  --  Liver Function Tests: Recent Labs  Lab 11/18/18 1211 11/19/18 0901 11/20/18 0620  AST 19 19 15   ALT 26 25 21   ALKPHOS 79 84 76  BILITOT 0.5 0.4 0.3  PROT 6.5 6.4* 5.9*  ALBUMIN 3.5 3.4* 3.1*   No results for input(s): LIPASE, AMYLASE in the last 168 hours. No results for input(s): AMMONIA in the last 168 hours. CBC: Recent Labs  Lab 11/18/18 1211 11/19/18 0901 11/20/18 0620 11/21/18 0628  WBC 7.6 5.4 5.1 5.2  NEUTROABS 5.3  --   --  2.9  HGB 11.1* 10.7* 10.1* 11.3*  HCT 32.3* 31.8* 30.5* 33.6*  MCV 89.0 89.1 88.7 88.7  PLT 173 185 188 207   Cardiac Enzymes: No results for input(s): CKTOTAL, CKMB, CKMBINDEX, TROPONINI in the last 168 hours. BNP: BNP (last 3 results) No results for input(s): BNP in the last 8760 hours.  ProBNP (last 3 results) No results for input(s): PROBNP in the last 8760 hours.  CBG: No results for input(s): GLUCAP in the last 168 hours.     Signed:  Irine Seal MD.  [facility] 11/21/2018, 3:18 PM

## 2018-11-21 NOTE — Progress Notes (Signed)
Physical Therapy Treatment Patient Details Name: Shannon Blair MRN: [id number] DOB: 11-Apr-1931 Today's Date: 11/21/2018    History of Present Illness Pt presents with UTI and increased confusion.  Note history of HTN, dementia, anal cancer and has had frequent UTI's in the past.     PT Comments    Progressing slowly with mobility. Pt continues to require +2 assist for standing and ambulation. Per chart, family does not want SNF or HH. Will continue to follow and progress activity as tolerated.   Follow Up Recommendations  Supervision/Assistance - 24 hour(family refusing SNF and HH)     Equipment Recommendations  None recommended by PT    Recommendations for Other Services       Precautions / Restrictions Precautions Precautions: Fall Restrictions Weight Bearing Restrictions: No    Mobility  Bed Mobility Overal bed mobility: Needs Assistance Bed Mobility: Supine to Sit     Supine to sit: Min assist;HOB elevated     General bed mobility comments: Assist to scoot to EOB. Increased time.  Transfers Overall transfer level: Needs assistance Equipment used: Rolling walker (2 wheeled) Transfers: Sit to/from Stand Sit to Stand: Mod assist;+2 safety/equipment;+2 physical assistance         General transfer comment: x2. Assist to rise, stabilize, control descent. VCs safety, hand placement. Pt had significant difficulty try to perform a stand pivot so just had her take a few steps forward then placed BSC/chair behind her to sit.  Ambulation/Gait Ambulation/Gait assistance: Mod assist;+2 physical assistance;+2 safety/equipment Gait Distance (Feet): 7 Feet Assistive device: Rolling walker (2 wheeled) Gait Pattern/deviations: Step-through pattern     General Gait Details: Difficulty taking steps, especially with R LE. Increased time and multimodal cueing required. Followed closely with recliner.   Stairs             Wheelchair Mobility    Modified Rankin  (Stroke Patients Only)       Balance Overall balance assessment: Needs assistance         Standing balance support: Bilateral upper extremity supported Standing balance-Leahy Scale: Poor                              Cognition Arousal/Alertness: Awake/alert Behavior During Therapy: WFL for tasks assessed/performed Overall Cognitive Status: History of cognitive impairments - at baseline                                        Exercises      General Comments        Pertinent Vitals/Pain Pain Assessment: No/denies pain    Home Living                      Prior Function            PT Goals (current goals can now be found in the care plan section) Progress towards PT goals: Progressing toward goals    Frequency    Min 3X/week      PT Plan      Co-evaluation              AM-PAC PT "6 Clicks" Mobility   Outcome Measure  Help needed turning from your back to your side while in a flat bed without using bedrails?: A Lot Help needed moving from lying on your back to sitting on the  side of a flat bed without using bedrails?: A Lot Help needed moving to and from a bed to a chair (including a wheelchair)?: A Lot Help needed standing up from a chair using your arms (e.g., wheelchair or bedside chair)?: A Lot Help needed to walk in hospital room?: A Lot Help needed climbing 3-5 steps with a railing? : Total 6 Click Score: 11    End of Session Equipment Utilized During Treatment: Gait belt Activity Tolerance: Patient limited by fatigue Patient left: in chair;with call bell/phone within reach;with chair alarm set(telesitter)   PT Visit Diagnosis: Unsteadiness on feet (R26.81);Muscle weakness (generalized) (M62.81);Difficulty in walking, not elsewhere classified (R26.2)     Time: [id number] PT Time Calculation (min) (ACUTE ONLY): 18 min  Charges:  $Gait Training: 8-22 mins                       Weston Anna,  PT Acute Rehabilitation Services Pager: [phone] Office: [phone]

## 2018-11-21 NOTE — TOC Transition Note (Signed)
Transition of Care [facility]) - CM/SW Discharge Note   Patient Details  Name: Shannon Blair MRN: [id number] Date of Birth: 1931/11/14  Transition of Care [facility]) CM/SW Contact:  Lia Hopping, [place] Phone Number: 11/21/2018, 12:07 PM   Clinical Narrative:    Patient Legal Guardian still declines SNF placement and Home Health. Legal Guardian will transport the patient home after 2:00pm today.  Physician notified.    Final next level of care: Home/Self Care Barriers to Discharge: No Barriers Identified   Patient Goals and CMS Choice        Discharge Placement  Home                      Discharge Plan and Services    Home                                  Social Determinants of Health (SDOH) Interventions     Readmission Risk Interventions No flowsheet data found.

## 2018-11-21 NOTE — Care Management Important Message (Signed)
Important Message  Patient Details IM Letter given to Kathrin Greathouse SW to present to the Patient Name: LAROSA MINASSIAN MRN: [id number] Date of Birth: November 01, 1931   Medicare Important Message Given:  Yes     Kerin Salen 11/21/2018, 10:54 AM

## 2018-11-25 DIAGNOSIS — I82890 Acute embolism and thrombosis of other specified veins: Secondary | ICD-10-CM | POA: Diagnosis not present

## 2018-11-25 DIAGNOSIS — M150 Primary generalized (osteo)arthritis: Secondary | ICD-10-CM | POA: Diagnosis not present

## 2018-11-25 DIAGNOSIS — N3281 Overactive bladder: Secondary | ICD-10-CM | POA: Diagnosis not present

## 2018-11-25 DIAGNOSIS — G9341 Metabolic encephalopathy: Secondary | ICD-10-CM | POA: Diagnosis not present

## 2018-11-25 DIAGNOSIS — F0390 Unspecified dementia without behavioral disturbance: Secondary | ICD-10-CM | POA: Diagnosis not present

## 2018-11-25 DIAGNOSIS — I10 Essential (primary) hypertension: Secondary | ICD-10-CM | POA: Diagnosis not present

## 2018-11-25 DIAGNOSIS — N3946 Mixed incontinence: Secondary | ICD-10-CM | POA: Diagnosis not present

## 2018-11-25 DIAGNOSIS — M5116 Intervertebral disc disorders with radiculopathy, lumbar region: Secondary | ICD-10-CM | POA: Diagnosis not present

## 2018-11-25 DIAGNOSIS — E785 Hyperlipidemia, unspecified: Secondary | ICD-10-CM | POA: Diagnosis not present

## 2018-11-26 ENCOUNTER — Other Ambulatory Visit: Payer: Self-pay

## 2018-11-26 NOTE — Patient Outreach (Signed)
[place] [facility]) Care Management  11/26/2018  AIRANNA HAINLINE [birth year] [id number]   Red EMMI notification received from [place]. Per notification, patient did not know if discharge instructions were provided.  Attempted outreach with Ms. Remus. Outreach unsuccessful. Will mail unsuccessful outreach letter and follow-up within 3-4 business days.  [place] Care Management [phone]

## 2018-11-29 DIAGNOSIS — I10 Essential (primary) hypertension: Secondary | ICD-10-CM | POA: Diagnosis not present

## 2018-11-29 DIAGNOSIS — N3946 Mixed incontinence: Secondary | ICD-10-CM | POA: Diagnosis not present

## 2018-11-29 DIAGNOSIS — M150 Primary generalized (osteo)arthritis: Secondary | ICD-10-CM | POA: Diagnosis not present

## 2018-11-29 DIAGNOSIS — F0390 Unspecified dementia without behavioral disturbance: Secondary | ICD-10-CM | POA: Diagnosis not present

## 2018-11-29 DIAGNOSIS — M5116 Intervertebral disc disorders with radiculopathy, lumbar region: Secondary | ICD-10-CM | POA: Diagnosis not present

## 2018-11-29 DIAGNOSIS — G9341 Metabolic encephalopathy: Secondary | ICD-10-CM | POA: Diagnosis not present

## 2018-11-29 DIAGNOSIS — I82890 Acute embolism and thrombosis of other specified veins: Secondary | ICD-10-CM | POA: Diagnosis not present

## 2018-11-29 DIAGNOSIS — E785 Hyperlipidemia, unspecified: Secondary | ICD-10-CM | POA: Diagnosis not present

## 2018-11-29 DIAGNOSIS — N3281 Overactive bladder: Secondary | ICD-10-CM | POA: Diagnosis not present

## 2018-12-02 ENCOUNTER — Other Ambulatory Visit: Payer: Self-pay

## 2018-12-02 DIAGNOSIS — I82890 Acute embolism and thrombosis of other specified veins: Secondary | ICD-10-CM | POA: Diagnosis not present

## 2018-12-02 DIAGNOSIS — I10 Essential (primary) hypertension: Secondary | ICD-10-CM | POA: Diagnosis not present

## 2018-12-02 DIAGNOSIS — M150 Primary generalized (osteo)arthritis: Secondary | ICD-10-CM | POA: Diagnosis not present

## 2018-12-02 DIAGNOSIS — N3281 Overactive bladder: Secondary | ICD-10-CM | POA: Diagnosis not present

## 2018-12-02 DIAGNOSIS — N3946 Mixed incontinence: Secondary | ICD-10-CM | POA: Diagnosis not present

## 2018-12-02 DIAGNOSIS — E785 Hyperlipidemia, unspecified: Secondary | ICD-10-CM | POA: Diagnosis not present

## 2018-12-02 DIAGNOSIS — F0390 Unspecified dementia without behavioral disturbance: Secondary | ICD-10-CM | POA: Diagnosis not present

## 2018-12-02 DIAGNOSIS — M5116 Intervertebral disc disorders with radiculopathy, lumbar region: Secondary | ICD-10-CM | POA: Diagnosis not present

## 2018-12-02 DIAGNOSIS — G9341 Metabolic encephalopathy: Secondary | ICD-10-CM | POA: Diagnosis not present

## 2018-12-02 NOTE — Patient Outreach (Signed)
[facility]) Care Management  12/02/2018  Shannon Blair Aug 23, 1931 [id number]    Unsuccessful outreach attempt. Will follow-up within 3-4 business days.  [place] Care Management [phone]

## 2018-12-03 DIAGNOSIS — I10 Essential (primary) hypertension: Secondary | ICD-10-CM | POA: Diagnosis not present

## 2018-12-03 DIAGNOSIS — G9341 Metabolic encephalopathy: Secondary | ICD-10-CM | POA: Diagnosis not present

## 2018-12-03 DIAGNOSIS — M150 Primary generalized (osteo)arthritis: Secondary | ICD-10-CM | POA: Diagnosis not present

## 2018-12-03 DIAGNOSIS — F0390 Unspecified dementia without behavioral disturbance: Secondary | ICD-10-CM | POA: Diagnosis not present

## 2018-12-03 DIAGNOSIS — E785 Hyperlipidemia, unspecified: Secondary | ICD-10-CM | POA: Diagnosis not present

## 2018-12-03 DIAGNOSIS — M5116 Intervertebral disc disorders with radiculopathy, lumbar region: Secondary | ICD-10-CM | POA: Diagnosis not present

## 2018-12-03 DIAGNOSIS — N3946 Mixed incontinence: Secondary | ICD-10-CM | POA: Diagnosis not present

## 2018-12-03 DIAGNOSIS — N3281 Overactive bladder: Secondary | ICD-10-CM | POA: Diagnosis not present

## 2018-12-03 DIAGNOSIS — I82890 Acute embolism and thrombosis of other specified veins: Secondary | ICD-10-CM | POA: Diagnosis not present

## 2018-12-04 DIAGNOSIS — F0390 Unspecified dementia without behavioral disturbance: Secondary | ICD-10-CM | POA: Diagnosis not present

## 2018-12-04 DIAGNOSIS — I82890 Acute embolism and thrombosis of other specified veins: Secondary | ICD-10-CM | POA: Diagnosis not present

## 2018-12-04 DIAGNOSIS — N3946 Mixed incontinence: Secondary | ICD-10-CM | POA: Diagnosis not present

## 2018-12-04 DIAGNOSIS — E785 Hyperlipidemia, unspecified: Secondary | ICD-10-CM | POA: Diagnosis not present

## 2018-12-04 DIAGNOSIS — M5116 Intervertebral disc disorders with radiculopathy, lumbar region: Secondary | ICD-10-CM | POA: Diagnosis not present

## 2018-12-04 DIAGNOSIS — I10 Essential (primary) hypertension: Secondary | ICD-10-CM | POA: Diagnosis not present

## 2018-12-04 DIAGNOSIS — N3281 Overactive bladder: Secondary | ICD-10-CM | POA: Diagnosis not present

## 2018-12-04 DIAGNOSIS — M150 Primary generalized (osteo)arthritis: Secondary | ICD-10-CM | POA: Diagnosis not present

## 2018-12-04 DIAGNOSIS — G9341 Metabolic encephalopathy: Secondary | ICD-10-CM | POA: Diagnosis not present

## 2018-12-05 DIAGNOSIS — I82890 Acute embolism and thrombosis of other specified veins: Secondary | ICD-10-CM | POA: Diagnosis not present

## 2018-12-05 DIAGNOSIS — N3946 Mixed incontinence: Secondary | ICD-10-CM | POA: Diagnosis not present

## 2018-12-05 DIAGNOSIS — N3281 Overactive bladder: Secondary | ICD-10-CM | POA: Diagnosis not present

## 2018-12-05 DIAGNOSIS — G9341 Metabolic encephalopathy: Secondary | ICD-10-CM | POA: Diagnosis not present

## 2018-12-05 DIAGNOSIS — F0390 Unspecified dementia without behavioral disturbance: Secondary | ICD-10-CM | POA: Diagnosis not present

## 2018-12-05 DIAGNOSIS — M5116 Intervertebral disc disorders with radiculopathy, lumbar region: Secondary | ICD-10-CM | POA: Diagnosis not present

## 2018-12-05 DIAGNOSIS — E785 Hyperlipidemia, unspecified: Secondary | ICD-10-CM | POA: Diagnosis not present

## 2018-12-05 DIAGNOSIS — I10 Essential (primary) hypertension: Secondary | ICD-10-CM | POA: Diagnosis not present

## 2018-12-05 DIAGNOSIS — M150 Primary generalized (osteo)arthritis: Secondary | ICD-10-CM | POA: Diagnosis not present

## 2018-12-06 ENCOUNTER — Other Ambulatory Visit: Payer: Self-pay

## 2018-12-06 DIAGNOSIS — I10 Essential (primary) hypertension: Secondary | ICD-10-CM | POA: Diagnosis not present

## 2018-12-06 DIAGNOSIS — M5116 Intervertebral disc disorders with radiculopathy, lumbar region: Secondary | ICD-10-CM | POA: Diagnosis not present

## 2018-12-06 DIAGNOSIS — I82890 Acute embolism and thrombosis of other specified veins: Secondary | ICD-10-CM | POA: Diagnosis not present

## 2018-12-06 DIAGNOSIS — M150 Primary generalized (osteo)arthritis: Secondary | ICD-10-CM | POA: Diagnosis not present

## 2018-12-06 DIAGNOSIS — F0390 Unspecified dementia without behavioral disturbance: Secondary | ICD-10-CM | POA: Diagnosis not present

## 2018-12-06 DIAGNOSIS — E785 Hyperlipidemia, unspecified: Secondary | ICD-10-CM | POA: Diagnosis not present

## 2018-12-06 DIAGNOSIS — N3281 Overactive bladder: Secondary | ICD-10-CM | POA: Diagnosis not present

## 2018-12-06 DIAGNOSIS — G9341 Metabolic encephalopathy: Secondary | ICD-10-CM | POA: Diagnosis not present

## 2018-12-06 DIAGNOSIS — N3946 Mixed incontinence: Secondary | ICD-10-CM | POA: Diagnosis not present

## 2018-12-06 NOTE — Patient Outreach (Signed)
[place] [facility]) Care Management  12/06/2018  Shannon Blair [birth year] [id number]    3rd unsuccessful outreach attempt. Case closure pending.   [place] Care Management [phone]

## 2018-12-09 DIAGNOSIS — I10 Essential (primary) hypertension: Secondary | ICD-10-CM | POA: Diagnosis not present

## 2018-12-09 DIAGNOSIS — G9341 Metabolic encephalopathy: Secondary | ICD-10-CM | POA: Diagnosis not present

## 2018-12-09 DIAGNOSIS — E785 Hyperlipidemia, unspecified: Secondary | ICD-10-CM | POA: Diagnosis not present

## 2018-12-09 DIAGNOSIS — N3281 Overactive bladder: Secondary | ICD-10-CM | POA: Diagnosis not present

## 2018-12-09 DIAGNOSIS — M5116 Intervertebral disc disorders with radiculopathy, lumbar region: Secondary | ICD-10-CM | POA: Diagnosis not present

## 2018-12-09 DIAGNOSIS — N3946 Mixed incontinence: Secondary | ICD-10-CM | POA: Diagnosis not present

## 2018-12-09 DIAGNOSIS — I82890 Acute embolism and thrombosis of other specified veins: Secondary | ICD-10-CM | POA: Diagnosis not present

## 2018-12-09 DIAGNOSIS — F0390 Unspecified dementia without behavioral disturbance: Secondary | ICD-10-CM | POA: Diagnosis not present

## 2018-12-09 DIAGNOSIS — M150 Primary generalized (osteo)arthritis: Secondary | ICD-10-CM | POA: Diagnosis not present

## 2018-12-10 DIAGNOSIS — N390 Urinary tract infection, site not specified: Secondary | ICD-10-CM | POA: Diagnosis not present

## 2018-12-10 DIAGNOSIS — I10 Essential (primary) hypertension: Secondary | ICD-10-CM | POA: Diagnosis not present

## 2018-12-11 DIAGNOSIS — N3946 Mixed incontinence: Secondary | ICD-10-CM | POA: Diagnosis not present

## 2018-12-11 DIAGNOSIS — N3281 Overactive bladder: Secondary | ICD-10-CM | POA: Diagnosis not present

## 2018-12-11 DIAGNOSIS — I10 Essential (primary) hypertension: Secondary | ICD-10-CM | POA: Diagnosis not present

## 2018-12-11 DIAGNOSIS — F0390 Unspecified dementia without behavioral disturbance: Secondary | ICD-10-CM | POA: Diagnosis not present

## 2018-12-11 DIAGNOSIS — E785 Hyperlipidemia, unspecified: Secondary | ICD-10-CM | POA: Diagnosis not present

## 2018-12-11 DIAGNOSIS — G9341 Metabolic encephalopathy: Secondary | ICD-10-CM | POA: Diagnosis not present

## 2018-12-11 DIAGNOSIS — I82890 Acute embolism and thrombosis of other specified veins: Secondary | ICD-10-CM | POA: Diagnosis not present

## 2018-12-11 DIAGNOSIS — M150 Primary generalized (osteo)arthritis: Secondary | ICD-10-CM | POA: Diagnosis not present

## 2018-12-11 DIAGNOSIS — M5116 Intervertebral disc disorders with radiculopathy, lumbar region: Secondary | ICD-10-CM | POA: Diagnosis not present

## 2018-12-14 DIAGNOSIS — I10 Essential (primary) hypertension: Secondary | ICD-10-CM | POA: Diagnosis not present

## 2018-12-14 DIAGNOSIS — N3281 Overactive bladder: Secondary | ICD-10-CM | POA: Diagnosis not present

## 2018-12-14 DIAGNOSIS — E785 Hyperlipidemia, unspecified: Secondary | ICD-10-CM | POA: Diagnosis not present

## 2018-12-14 DIAGNOSIS — M5116 Intervertebral disc disorders with radiculopathy, lumbar region: Secondary | ICD-10-CM | POA: Diagnosis not present

## 2018-12-14 DIAGNOSIS — I82890 Acute embolism and thrombosis of other specified veins: Secondary | ICD-10-CM | POA: Diagnosis not present

## 2018-12-14 DIAGNOSIS — F0390 Unspecified dementia without behavioral disturbance: Secondary | ICD-10-CM | POA: Diagnosis not present

## 2018-12-14 DIAGNOSIS — G9341 Metabolic encephalopathy: Secondary | ICD-10-CM | POA: Diagnosis not present

## 2018-12-14 DIAGNOSIS — M150 Primary generalized (osteo)arthritis: Secondary | ICD-10-CM | POA: Diagnosis not present

## 2018-12-14 DIAGNOSIS — N3946 Mixed incontinence: Secondary | ICD-10-CM | POA: Diagnosis not present

## 2018-12-16 DIAGNOSIS — I82890 Acute embolism and thrombosis of other specified veins: Secondary | ICD-10-CM | POA: Diagnosis not present

## 2018-12-16 DIAGNOSIS — G9341 Metabolic encephalopathy: Secondary | ICD-10-CM | POA: Diagnosis not present

## 2018-12-16 DIAGNOSIS — F0390 Unspecified dementia without behavioral disturbance: Secondary | ICD-10-CM | POA: Diagnosis not present

## 2018-12-16 DIAGNOSIS — N3281 Overactive bladder: Secondary | ICD-10-CM | POA: Diagnosis not present

## 2018-12-16 DIAGNOSIS — E785 Hyperlipidemia, unspecified: Secondary | ICD-10-CM | POA: Diagnosis not present

## 2018-12-16 DIAGNOSIS — N3946 Mixed incontinence: Secondary | ICD-10-CM | POA: Diagnosis not present

## 2018-12-16 DIAGNOSIS — I10 Essential (primary) hypertension: Secondary | ICD-10-CM | POA: Diagnosis not present

## 2018-12-16 DIAGNOSIS — M150 Primary generalized (osteo)arthritis: Secondary | ICD-10-CM | POA: Diagnosis not present

## 2018-12-16 DIAGNOSIS — M5116 Intervertebral disc disorders with radiculopathy, lumbar region: Secondary | ICD-10-CM | POA: Diagnosis not present

## 2018-12-18 DIAGNOSIS — N3281 Overactive bladder: Secondary | ICD-10-CM | POA: Diagnosis not present

## 2018-12-18 DIAGNOSIS — M150 Primary generalized (osteo)arthritis: Secondary | ICD-10-CM | POA: Diagnosis not present

## 2018-12-18 DIAGNOSIS — F0390 Unspecified dementia without behavioral disturbance: Secondary | ICD-10-CM | POA: Diagnosis not present

## 2018-12-18 DIAGNOSIS — G9341 Metabolic encephalopathy: Secondary | ICD-10-CM | POA: Diagnosis not present

## 2018-12-18 DIAGNOSIS — M5116 Intervertebral disc disorders with radiculopathy, lumbar region: Secondary | ICD-10-CM | POA: Diagnosis not present

## 2018-12-18 DIAGNOSIS — N3946 Mixed incontinence: Secondary | ICD-10-CM | POA: Diagnosis not present

## 2018-12-18 DIAGNOSIS — I82890 Acute embolism and thrombosis of other specified veins: Secondary | ICD-10-CM | POA: Diagnosis not present

## 2018-12-18 DIAGNOSIS — E785 Hyperlipidemia, unspecified: Secondary | ICD-10-CM | POA: Diagnosis not present

## 2018-12-18 DIAGNOSIS — I10 Essential (primary) hypertension: Secondary | ICD-10-CM | POA: Diagnosis not present

## 2018-12-19 DIAGNOSIS — M150 Primary generalized (osteo)arthritis: Secondary | ICD-10-CM | POA: Diagnosis not present

## 2018-12-19 DIAGNOSIS — N3946 Mixed incontinence: Secondary | ICD-10-CM | POA: Diagnosis not present

## 2018-12-19 DIAGNOSIS — I10 Essential (primary) hypertension: Secondary | ICD-10-CM | POA: Diagnosis not present

## 2018-12-19 DIAGNOSIS — E785 Hyperlipidemia, unspecified: Secondary | ICD-10-CM | POA: Diagnosis not present

## 2018-12-19 DIAGNOSIS — N3281 Overactive bladder: Secondary | ICD-10-CM | POA: Diagnosis not present

## 2018-12-19 DIAGNOSIS — M5116 Intervertebral disc disorders with radiculopathy, lumbar region: Secondary | ICD-10-CM | POA: Diagnosis not present

## 2018-12-19 DIAGNOSIS — I82890 Acute embolism and thrombosis of other specified veins: Secondary | ICD-10-CM | POA: Diagnosis not present

## 2018-12-19 DIAGNOSIS — G9341 Metabolic encephalopathy: Secondary | ICD-10-CM | POA: Diagnosis not present

## 2018-12-19 DIAGNOSIS — F0390 Unspecified dementia without behavioral disturbance: Secondary | ICD-10-CM | POA: Diagnosis not present

## 2018-12-20 DIAGNOSIS — M150 Primary generalized (osteo)arthritis: Secondary | ICD-10-CM | POA: Diagnosis not present

## 2018-12-20 DIAGNOSIS — M5116 Intervertebral disc disorders with radiculopathy, lumbar region: Secondary | ICD-10-CM | POA: Diagnosis not present

## 2018-12-20 DIAGNOSIS — I82890 Acute embolism and thrombosis of other specified veins: Secondary | ICD-10-CM | POA: Diagnosis not present

## 2018-12-20 DIAGNOSIS — F0390 Unspecified dementia without behavioral disturbance: Secondary | ICD-10-CM | POA: Diagnosis not present

## 2018-12-20 DIAGNOSIS — G9341 Metabolic encephalopathy: Secondary | ICD-10-CM | POA: Diagnosis not present

## 2018-12-20 DIAGNOSIS — N3281 Overactive bladder: Secondary | ICD-10-CM | POA: Diagnosis not present

## 2018-12-20 DIAGNOSIS — I10 Essential (primary) hypertension: Secondary | ICD-10-CM | POA: Diagnosis not present

## 2018-12-20 DIAGNOSIS — N3946 Mixed incontinence: Secondary | ICD-10-CM | POA: Diagnosis not present

## 2018-12-20 DIAGNOSIS — E785 Hyperlipidemia, unspecified: Secondary | ICD-10-CM | POA: Diagnosis not present

## 2018-12-24 DIAGNOSIS — M150 Primary generalized (osteo)arthritis: Secondary | ICD-10-CM | POA: Diagnosis not present

## 2018-12-24 DIAGNOSIS — N3946 Mixed incontinence: Secondary | ICD-10-CM | POA: Diagnosis not present

## 2018-12-24 DIAGNOSIS — E785 Hyperlipidemia, unspecified: Secondary | ICD-10-CM | POA: Diagnosis not present

## 2018-12-24 DIAGNOSIS — M5116 Intervertebral disc disorders with radiculopathy, lumbar region: Secondary | ICD-10-CM | POA: Diagnosis not present

## 2018-12-24 DIAGNOSIS — N3281 Overactive bladder: Secondary | ICD-10-CM | POA: Diagnosis not present

## 2018-12-24 DIAGNOSIS — G9341 Metabolic encephalopathy: Secondary | ICD-10-CM | POA: Diagnosis not present

## 2018-12-24 DIAGNOSIS — F0390 Unspecified dementia without behavioral disturbance: Secondary | ICD-10-CM | POA: Diagnosis not present

## 2018-12-24 DIAGNOSIS — I10 Essential (primary) hypertension: Secondary | ICD-10-CM | POA: Diagnosis not present

## 2018-12-24 DIAGNOSIS — I82890 Acute embolism and thrombosis of other specified veins: Secondary | ICD-10-CM | POA: Diagnosis not present

## 2018-12-26 DIAGNOSIS — G9341 Metabolic encephalopathy: Secondary | ICD-10-CM | POA: Diagnosis not present

## 2018-12-26 DIAGNOSIS — N3281 Overactive bladder: Secondary | ICD-10-CM | POA: Diagnosis not present

## 2018-12-26 DIAGNOSIS — M150 Primary generalized (osteo)arthritis: Secondary | ICD-10-CM | POA: Diagnosis not present

## 2018-12-26 DIAGNOSIS — E785 Hyperlipidemia, unspecified: Secondary | ICD-10-CM | POA: Diagnosis not present

## 2018-12-26 DIAGNOSIS — N3946 Mixed incontinence: Secondary | ICD-10-CM | POA: Diagnosis not present

## 2018-12-26 DIAGNOSIS — I82890 Acute embolism and thrombosis of other specified veins: Secondary | ICD-10-CM | POA: Diagnosis not present

## 2018-12-26 DIAGNOSIS — I10 Essential (primary) hypertension: Secondary | ICD-10-CM | POA: Diagnosis not present

## 2018-12-26 DIAGNOSIS — F0390 Unspecified dementia without behavioral disturbance: Secondary | ICD-10-CM | POA: Diagnosis not present

## 2018-12-26 DIAGNOSIS — M5116 Intervertebral disc disorders with radiculopathy, lumbar region: Secondary | ICD-10-CM | POA: Diagnosis not present

## 2018-12-26 DIAGNOSIS — N3020 Other chronic cystitis without hematuria: Secondary | ICD-10-CM | POA: Diagnosis not present

## 2018-12-31 DIAGNOSIS — I82890 Acute embolism and thrombosis of other specified veins: Secondary | ICD-10-CM | POA: Diagnosis not present

## 2018-12-31 DIAGNOSIS — M150 Primary generalized (osteo)arthritis: Secondary | ICD-10-CM | POA: Diagnosis not present

## 2018-12-31 DIAGNOSIS — I10 Essential (primary) hypertension: Secondary | ICD-10-CM | POA: Diagnosis not present

## 2018-12-31 DIAGNOSIS — F0390 Unspecified dementia without behavioral disturbance: Secondary | ICD-10-CM | POA: Diagnosis not present

## 2018-12-31 DIAGNOSIS — E785 Hyperlipidemia, unspecified: Secondary | ICD-10-CM | POA: Diagnosis not present

## 2018-12-31 DIAGNOSIS — N3281 Overactive bladder: Secondary | ICD-10-CM | POA: Diagnosis not present

## 2018-12-31 DIAGNOSIS — M5116 Intervertebral disc disorders with radiculopathy, lumbar region: Secondary | ICD-10-CM | POA: Diagnosis not present

## 2018-12-31 DIAGNOSIS — G9341 Metabolic encephalopathy: Secondary | ICD-10-CM | POA: Diagnosis not present

## 2018-12-31 DIAGNOSIS — N3946 Mixed incontinence: Secondary | ICD-10-CM | POA: Diagnosis not present

## 2019-01-02 DIAGNOSIS — I82890 Acute embolism and thrombosis of other specified veins: Secondary | ICD-10-CM | POA: Diagnosis not present

## 2019-01-02 DIAGNOSIS — F0390 Unspecified dementia without behavioral disturbance: Secondary | ICD-10-CM | POA: Diagnosis not present

## 2019-01-02 DIAGNOSIS — N3281 Overactive bladder: Secondary | ICD-10-CM | POA: Diagnosis not present

## 2019-01-02 DIAGNOSIS — M5116 Intervertebral disc disorders with radiculopathy, lumbar region: Secondary | ICD-10-CM | POA: Diagnosis not present

## 2019-01-02 DIAGNOSIS — M150 Primary generalized (osteo)arthritis: Secondary | ICD-10-CM | POA: Diagnosis not present

## 2019-01-02 DIAGNOSIS — E785 Hyperlipidemia, unspecified: Secondary | ICD-10-CM | POA: Diagnosis not present

## 2019-01-02 DIAGNOSIS — G9341 Metabolic encephalopathy: Secondary | ICD-10-CM | POA: Diagnosis not present

## 2019-01-02 DIAGNOSIS — I10 Essential (primary) hypertension: Secondary | ICD-10-CM | POA: Diagnosis not present

## 2019-01-02 DIAGNOSIS — N3946 Mixed incontinence: Secondary | ICD-10-CM | POA: Diagnosis not present

## 2019-01-06 DIAGNOSIS — N3946 Mixed incontinence: Secondary | ICD-10-CM | POA: Diagnosis not present

## 2019-01-06 DIAGNOSIS — M150 Primary generalized (osteo)arthritis: Secondary | ICD-10-CM | POA: Diagnosis not present

## 2019-01-06 DIAGNOSIS — I10 Essential (primary) hypertension: Secondary | ICD-10-CM | POA: Diagnosis not present

## 2019-01-06 DIAGNOSIS — F0390 Unspecified dementia without behavioral disturbance: Secondary | ICD-10-CM | POA: Diagnosis not present

## 2019-01-06 DIAGNOSIS — I82890 Acute embolism and thrombosis of other specified veins: Secondary | ICD-10-CM | POA: Diagnosis not present

## 2019-01-06 DIAGNOSIS — M5116 Intervertebral disc disorders with radiculopathy, lumbar region: Secondary | ICD-10-CM | POA: Diagnosis not present

## 2019-01-06 DIAGNOSIS — G9341 Metabolic encephalopathy: Secondary | ICD-10-CM | POA: Diagnosis not present

## 2019-01-06 DIAGNOSIS — E785 Hyperlipidemia, unspecified: Secondary | ICD-10-CM | POA: Diagnosis not present

## 2019-01-06 DIAGNOSIS — N3281 Overactive bladder: Secondary | ICD-10-CM | POA: Diagnosis not present

## 2019-01-07 DIAGNOSIS — N3281 Overactive bladder: Secondary | ICD-10-CM | POA: Diagnosis not present

## 2019-01-07 DIAGNOSIS — E785 Hyperlipidemia, unspecified: Secondary | ICD-10-CM | POA: Diagnosis not present

## 2019-01-07 DIAGNOSIS — I82890 Acute embolism and thrombosis of other specified veins: Secondary | ICD-10-CM | POA: Diagnosis not present

## 2019-01-07 DIAGNOSIS — N3946 Mixed incontinence: Secondary | ICD-10-CM | POA: Diagnosis not present

## 2019-01-07 DIAGNOSIS — G9341 Metabolic encephalopathy: Secondary | ICD-10-CM | POA: Diagnosis not present

## 2019-01-07 DIAGNOSIS — M5116 Intervertebral disc disorders with radiculopathy, lumbar region: Secondary | ICD-10-CM | POA: Diagnosis not present

## 2019-01-07 DIAGNOSIS — F0390 Unspecified dementia without behavioral disturbance: Secondary | ICD-10-CM | POA: Diagnosis not present

## 2019-01-07 DIAGNOSIS — M150 Primary generalized (osteo)arthritis: Secondary | ICD-10-CM | POA: Diagnosis not present

## 2019-01-07 DIAGNOSIS — I10 Essential (primary) hypertension: Secondary | ICD-10-CM | POA: Diagnosis not present

## 2019-01-09 DIAGNOSIS — M5116 Intervertebral disc disorders with radiculopathy, lumbar region: Secondary | ICD-10-CM | POA: Diagnosis not present

## 2019-01-09 DIAGNOSIS — N3281 Overactive bladder: Secondary | ICD-10-CM | POA: Diagnosis not present

## 2019-01-09 DIAGNOSIS — G9341 Metabolic encephalopathy: Secondary | ICD-10-CM | POA: Diagnosis not present

## 2019-01-09 DIAGNOSIS — F0390 Unspecified dementia without behavioral disturbance: Secondary | ICD-10-CM | POA: Diagnosis not present

## 2019-01-09 DIAGNOSIS — M150 Primary generalized (osteo)arthritis: Secondary | ICD-10-CM | POA: Diagnosis not present

## 2019-01-09 DIAGNOSIS — I10 Essential (primary) hypertension: Secondary | ICD-10-CM | POA: Diagnosis not present

## 2019-01-09 DIAGNOSIS — E785 Hyperlipidemia, unspecified: Secondary | ICD-10-CM | POA: Diagnosis not present

## 2019-01-09 DIAGNOSIS — I82890 Acute embolism and thrombosis of other specified veins: Secondary | ICD-10-CM | POA: Diagnosis not present

## 2019-01-09 DIAGNOSIS — N3946 Mixed incontinence: Secondary | ICD-10-CM | POA: Diagnosis not present

## 2019-01-23 DIAGNOSIS — R739 Hyperglycemia, unspecified: Secondary | ICD-10-CM | POA: Diagnosis not present

## 2019-01-23 DIAGNOSIS — E782 Mixed hyperlipidemia: Secondary | ICD-10-CM | POA: Diagnosis not present

## 2019-01-23 DIAGNOSIS — I10 Essential (primary) hypertension: Secondary | ICD-10-CM | POA: Diagnosis not present

## 2019-01-23 DIAGNOSIS — F0390 Unspecified dementia without behavioral disturbance: Secondary | ICD-10-CM | POA: Diagnosis not present

## 2019-01-30 DIAGNOSIS — M25511 Pain in right shoulder: Secondary | ICD-10-CM | POA: Diagnosis not present

## 2019-01-30 DIAGNOSIS — I10 Essential (primary) hypertension: Secondary | ICD-10-CM | POA: Diagnosis not present

## 2019-01-30 DIAGNOSIS — E782 Mixed hyperlipidemia: Secondary | ICD-10-CM | POA: Diagnosis not present

## 2019-01-30 DIAGNOSIS — F0390 Unspecified dementia without behavioral disturbance: Secondary | ICD-10-CM | POA: Diagnosis not present

## 2019-01-30 DIAGNOSIS — R739 Hyperglycemia, unspecified: Secondary | ICD-10-CM | POA: Diagnosis not present

## 2019-02-16 ENCOUNTER — Encounter (HOSPITAL_COMMUNITY): Payer: Self-pay

## 2019-02-16 ENCOUNTER — Inpatient Hospital Stay (HOSPITAL_COMMUNITY)
Admission: EM | Admit: 2019-02-16 | Discharge: 2019-02-25 | DRG: 242 | Disposition: A | Discharge status: Skilled Nursing Facility | Payer: Medicare HMO | Attending: Internal Medicine | Admitting: Internal Medicine

## 2019-02-16 ENCOUNTER — Other Ambulatory Visit: Payer: Self-pay

## 2019-02-16 DIAGNOSIS — I129 Hypertensive chronic kidney disease with stage 1 through stage 4 chronic kidney disease, or unspecified chronic kidney disease: Secondary | ICD-10-CM | POA: Diagnosis present

## 2019-02-16 DIAGNOSIS — Z95810 Presence of automatic (implantable) cardiac defibrillator: Secondary | ICD-10-CM | POA: Diagnosis not present

## 2019-02-16 DIAGNOSIS — Z20828 Contact with and (suspected) exposure to other viral communicable diseases: Secondary | ICD-10-CM | POA: Diagnosis present

## 2019-02-16 DIAGNOSIS — Z66 Do not resuscitate: Secondary | ICD-10-CM | POA: Diagnosis present

## 2019-02-16 DIAGNOSIS — R4189 Other symptoms and signs involving cognitive functions and awareness: Secondary | ICD-10-CM

## 2019-02-16 DIAGNOSIS — J9811 Atelectasis: Secondary | ICD-10-CM | POA: Diagnosis not present

## 2019-02-16 DIAGNOSIS — E873 Alkalosis: Secondary | ICD-10-CM | POA: Diagnosis present

## 2019-02-16 DIAGNOSIS — I160 Hypertensive urgency: Secondary | ICD-10-CM | POA: Diagnosis not present

## 2019-02-16 DIAGNOSIS — Z8249 Family history of ischemic heart disease and other diseases of the circulatory system: Secondary | ICD-10-CM

## 2019-02-16 DIAGNOSIS — Z800 Family history of malignant neoplasm of digestive organs: Secondary | ICD-10-CM

## 2019-02-16 DIAGNOSIS — Z743 Need for continuous supervision: Secondary | ICD-10-CM | POA: Diagnosis not present

## 2019-02-16 DIAGNOSIS — J9601 Acute respiratory failure with hypoxia: Secondary | ICD-10-CM | POA: Diagnosis not present

## 2019-02-16 DIAGNOSIS — R32 Unspecified urinary incontinence: Secondary | ICD-10-CM | POA: Diagnosis present

## 2019-02-16 DIAGNOSIS — R9431 Abnormal electrocardiogram [ECG] [EKG]: Secondary | ICD-10-CM | POA: Diagnosis not present

## 2019-02-16 DIAGNOSIS — Z91013 Allergy to seafood: Secondary | ICD-10-CM

## 2019-02-16 DIAGNOSIS — Z90710 Acquired absence of both cervix and uterus: Secondary | ICD-10-CM

## 2019-02-16 DIAGNOSIS — N1832 Chronic kidney disease, stage 3b: Secondary | ICD-10-CM | POA: Diagnosis present

## 2019-02-16 DIAGNOSIS — Z9221 Personal history of antineoplastic chemotherapy: Secondary | ICD-10-CM

## 2019-02-16 DIAGNOSIS — Z923 Personal history of irradiation: Secondary | ICD-10-CM | POA: Diagnosis not present

## 2019-02-16 DIAGNOSIS — Z7189 Other specified counseling: Secondary | ICD-10-CM

## 2019-02-16 DIAGNOSIS — R609 Edema, unspecified: Secondary | ICD-10-CM | POA: Diagnosis present

## 2019-02-16 DIAGNOSIS — Z85048 Personal history of other malignant neoplasm of rectum, rectosigmoid junction, and anus: Secondary | ICD-10-CM | POA: Diagnosis not present

## 2019-02-16 DIAGNOSIS — R0902 Hypoxemia: Secondary | ICD-10-CM | POA: Diagnosis not present

## 2019-02-16 DIAGNOSIS — M1990 Unspecified osteoarthritis, unspecified site: Secondary | ICD-10-CM | POA: Diagnosis present

## 2019-02-16 DIAGNOSIS — I10 Essential (primary) hypertension: Secondary | ICD-10-CM | POA: Diagnosis not present

## 2019-02-16 DIAGNOSIS — G9341 Metabolic encephalopathy: Secondary | ICD-10-CM | POA: Diagnosis not present

## 2019-02-16 DIAGNOSIS — Z95818 Presence of other cardiac implants and grafts: Secondary | ICD-10-CM

## 2019-02-16 DIAGNOSIS — R279 Unspecified lack of coordination: Secondary | ICD-10-CM | POA: Diagnosis not present

## 2019-02-16 DIAGNOSIS — R001 Bradycardia, unspecified: Secondary | ICD-10-CM | POA: Diagnosis not present

## 2019-02-16 DIAGNOSIS — L8990 Pressure ulcer of unspecified site, unspecified stage: Secondary | ICD-10-CM | POA: Insufficient documentation

## 2019-02-16 DIAGNOSIS — I447 Left bundle-branch block, unspecified: Secondary | ICD-10-CM | POA: Diagnosis not present

## 2019-02-16 DIAGNOSIS — I442 Atrioventricular block, complete: Principal | ICD-10-CM | POA: Diagnosis present

## 2019-02-16 DIAGNOSIS — N183 Chronic kidney disease, stage 3 unspecified: Secondary | ICD-10-CM | POA: Diagnosis not present

## 2019-02-16 DIAGNOSIS — Z950 Presence of cardiac pacemaker: Secondary | ICD-10-CM | POA: Diagnosis not present

## 2019-02-16 DIAGNOSIS — E875 Hyperkalemia: Secondary | ICD-10-CM | POA: Diagnosis not present

## 2019-02-16 DIAGNOSIS — Z515 Encounter for palliative care: Secondary | ICD-10-CM

## 2019-02-16 DIAGNOSIS — N390 Urinary tract infection, site not specified: Secondary | ICD-10-CM | POA: Diagnosis not present

## 2019-02-16 DIAGNOSIS — L89151 Pressure ulcer of sacral region, stage 1: Secondary | ICD-10-CM | POA: Diagnosis present

## 2019-02-16 DIAGNOSIS — R4020 Unspecified coma: Secondary | ICD-10-CM | POA: Diagnosis not present

## 2019-02-16 DIAGNOSIS — I959 Hypotension, unspecified: Secondary | ICD-10-CM | POA: Diagnosis not present

## 2019-02-16 DIAGNOSIS — Z87440 Personal history of urinary (tract) infections: Secondary | ICD-10-CM | POA: Diagnosis not present

## 2019-02-16 DIAGNOSIS — Z905 Acquired absence of kidney: Secondary | ICD-10-CM

## 2019-02-16 DIAGNOSIS — R410 Disorientation, unspecified: Secondary | ICD-10-CM | POA: Diagnosis not present

## 2019-02-16 DIAGNOSIS — R079 Chest pain, unspecified: Secondary | ICD-10-CM | POA: Diagnosis not present

## 2019-02-16 DIAGNOSIS — R404 Transient alteration of awareness: Secondary | ICD-10-CM | POA: Diagnosis not present

## 2019-02-16 DIAGNOSIS — R41841 Cognitive communication deficit: Secondary | ICD-10-CM | POA: Diagnosis not present

## 2019-02-16 DIAGNOSIS — R2681 Unsteadiness on feet: Secondary | ICD-10-CM | POA: Diagnosis not present

## 2019-02-16 DIAGNOSIS — R1312 Dysphagia, oropharyngeal phase: Secondary | ICD-10-CM | POA: Diagnosis not present

## 2019-02-16 DIAGNOSIS — R531 Weakness: Secondary | ICD-10-CM | POA: Diagnosis not present

## 2019-02-16 DIAGNOSIS — I4430 Unspecified atrioventricular block: Secondary | ICD-10-CM | POA: Diagnosis not present

## 2019-02-16 DIAGNOSIS — R0789 Other chest pain: Secondary | ICD-10-CM | POA: Diagnosis not present

## 2019-02-16 DIAGNOSIS — Z79899 Other long term (current) drug therapy: Secondary | ICD-10-CM

## 2019-02-16 DIAGNOSIS — F0390 Unspecified dementia without behavioral disturbance: Secondary | ICD-10-CM | POA: Diagnosis not present

## 2019-02-16 DIAGNOSIS — U071 COVID-19: Secondary | ICD-10-CM | POA: Diagnosis not present

## 2019-02-16 DIAGNOSIS — Z7982 Long term (current) use of aspirin: Secondary | ICD-10-CM

## 2019-02-16 DIAGNOSIS — Z9109 Other allergy status, other than to drugs and biological substances: Secondary | ICD-10-CM

## 2019-02-16 DIAGNOSIS — R278 Other lack of coordination: Secondary | ICD-10-CM | POA: Diagnosis not present

## 2019-02-16 LAB — BASIC METABOLIC PANEL
Anion gap: 11 (ref 5–15)
BUN: 29 mg/dL — ABNORMAL HIGH (ref 8–23)
CO2: 22 mmol/L (ref 22–32)
Calcium: 10.5 mg/dL — ABNORMAL HIGH (ref 8.9–10.3)
Chloride: 107 mmol/L (ref 98–111)
Creatinine, Ser: 1.31 mg/dL — ABNORMAL HIGH (ref 0.44–1.00)
GFR calc Af Amer: 43 mL/min — ABNORMAL LOW (ref >60)
GFR calc non Af Amer: 37 mL/min — ABNORMAL LOW (ref >60)
Glucose, Bld: 85 mg/dL (ref 70–99)
Potassium: 5.3 mmol/L — ABNORMAL HIGH (ref 3.5–5.1)
Sodium: 140 mmol/L (ref 135–145)

## 2019-02-16 LAB — URINALYSIS, ROUTINE W REFLEX MICROSCOPIC
Bilirubin Urine: NEGATIVE
Glucose, UA: NEGATIVE mg/dL
Hgb urine dipstick: NEGATIVE
Ketones, ur: NEGATIVE mg/dL
Nitrite: NEGATIVE
Protein, ur: 100 mg/dL — AB
Specific Gravity, Urine: 1.02 (ref 1.005–1.030)
WBC, UA: >50 WBC/hpf — ABNORMAL HIGH (ref 0–5)
pH: 6 (ref 5.0–8.0)

## 2019-02-16 LAB — CBC WITH DIFFERENTIAL/PLATELET
Abs Immature Granulocytes: 0.06 10*3/uL (ref 0.00–0.07)
Basophils Absolute: 0.1 10*3/uL (ref 0.0–0.1)
Basophils Relative: 1 %
Eosinophils Absolute: 0.4 10*3/uL (ref 0.0–0.5)
Eosinophils Relative: 5 %
HCT: 38.7 % (ref 36.0–46.0)
Hemoglobin: 13.2 g/dL (ref 12.0–15.0)
Immature Granulocytes: 1 %
Lymphocytes Relative: 21 %
Lymphs Abs: 1.7 10*3/uL (ref 0.7–4.0)
MCH: 30.4 pg (ref 26.0–34.0)
MCHC: 34.1 g/dL (ref 30.0–36.0)
MCV: 89.2 fL (ref 80.0–100.0)
Monocytes Absolute: 0.6 10*3/uL (ref 0.1–1.0)
Monocytes Relative: 7 %
Neutro Abs: 5.5 10*3/uL (ref 1.7–7.7)
Neutrophils Relative %: 65 %
Platelets: 222 10*3/uL (ref 150–400)
RBC: 4.34 MIL/uL (ref 3.87–5.11)
RDW: 13.8 % (ref 11.5–15.5)
WBC: 8.4 10*3/uL (ref 4.0–10.5)
nRBC: 0 % (ref 0.0–0.2)

## 2019-02-16 MED ORDER — SODIUM CHLORIDE 0.9 % IV SOLN
1.0000 g | Freq: Once | INTRAVENOUS | Status: AC
  Administered 2019-02-16: 1 g via INTRAVENOUS
  Filled 2019-02-16: qty 10

## 2019-02-16 MED ORDER — SODIUM CHLORIDE 0.9 % IV SOLN
INTRAVENOUS | Status: DC
  Administered 2019-02-17 – 2019-02-20 (×6): via INTRAVENOUS

## 2019-02-16 MED ORDER — HYDRALAZINE HCL 20 MG/ML IJ SOLN
10.0000 mg | Freq: Four times a day (QID) | INTRAMUSCULAR | Status: DC | PRN
  Administered 2019-02-17 – 2019-02-18 (×4): 10 mg via INTRAVENOUS
  Filled 2019-02-16 (×4): qty 1

## 2019-02-16 MED ORDER — SODIUM BICARBONATE 8.4 % IV SOLN: 50.0000 meq | Freq: Once | INTRAVENOUS | Status: DC

## 2019-02-16 MED ORDER — ACETAMINOPHEN 650 MG RE SUPP: 650.0000 mg | Freq: Four times a day (QID) | RECTAL | Status: DC | PRN

## 2019-02-16 MED ORDER — HYDRALAZINE HCL 20 MG/ML IJ SOLN: 5.0000 mg | Freq: Four times a day (QID) | INTRAMUSCULAR | Status: DC | PRN

## 2019-02-16 MED ORDER — SODIUM CHLORIDE 0.9 % IV BOLUS
1000.0000 mL | Freq: Once | INTRAVENOUS | Status: AC
  Administered 2019-02-16: 1000 mL via INTRAVENOUS

## 2019-02-16 MED ORDER — SODIUM POLYSTYRENE SULFONATE 15 GM/60ML PO SUSP: 15.0000 g | Freq: Once | ORAL | Status: DC

## 2019-02-16 MED ORDER — ENOXAPARIN SODIUM 40 MG/0.4ML ~~LOC~~ SOLN: 40.0000 mg | Freq: Every day | SUBCUTANEOUS | Status: DC

## 2019-02-16 MED ORDER — SODIUM CHLORIDE 0.9 % IV SOLN
1.0000 g | INTRAVENOUS | Status: AC
  Administered 2019-02-18 – 2019-02-21 (×4): 1 g via INTRAVENOUS
  Filled 2019-02-16: qty 10
  Filled 2019-02-16 (×4): qty 1
  Filled 2019-02-16: qty 10

## 2019-02-16 MED ORDER — ACETAMINOPHEN 325 MG PO TABS: 650.0000 mg | ORAL_TABLET | Freq: Four times a day (QID) | ORAL | Status: DC | PRN

## 2019-02-16 MED ORDER — ENOXAPARIN SODIUM 30 MG/0.3ML ~~LOC~~ SOLN
30.0000 mg | Freq: Every day | SUBCUTANEOUS | Status: DC
  Administered 2019-02-17 – 2019-02-24 (×8): 30 mg via SUBCUTANEOUS
  Filled 2019-02-16 (×8): qty 0.3

## 2019-02-16 NOTE — ED Provider Notes (Signed)
[facility] EMERGENCY DEPARTMENT Provider Note   CSN: [id number] Arrival date & time: 02/16/19  1528     History   Chief Complaint Chief Complaint  Patient presents with   Weakness    HPI Shannon Blair is a 85 y.o. female.     HPI   Patient was seen by me at 1520.  She was alert and conversant.  She is on sure why she was sent here.  Level 5 caveat-dementia  Past Medical History:  Diagnosis Date   Anal cancer ([place]) 2004   Non-surgical mgt with chemo-radiation.  Dr Alen Blew follows.    Anal fissure    Colon, diverticulosis    Sigmoid. With associated stenosis.  Colonoscopy failed to intubate beyond stenosis in 08/2010.  rectal bx then negative for recurrent cancer.    Dementia ([place])    Hypertension    OA (osteoarthritis)    Squamous cell carcinoma    skin   Varicose veins of left lower extremity with pain     Patient Active Problem List   Diagnosis Date Noted   Weakness generalized 11/20/2018   Acute encephalopathy 08/06/2018   Pyelonephritis 08/06/2018   ARF (acute renal failure) ([place]) 08/06/2018   Hydronephrosis 08/06/2018   UTI (urinary tract infection) [id number]   Acute metabolic encephalopathy [id number]   AKI (acute kidney injury) ([place]) 07/06/2018   Dementia ([place])    Spinal stenosis, lumbar region with neurogenic claudication 07/26/2016   Pseudoaphakia 11/13/2011   Nonspecific (abnormal) findings on radiological and other examination of gastrointestinal tract 06/26/2011   Colitis 06/25/2011   Rectal bleeding 06/25/2011   History of rectal or anal cancer 06/25/2011   HTN (hypertension) 06/25/2011    Past Surgical History:  Procedure Laterality Date   ABDOMINAL HYSTERECTOMY     APPENDECTOMY     BLADDER REPAIR     CATARACT EXTRACTION  05/2004    dr Ishmael Holter   LUMBAR LAMINECTOMY/DECOMPRESSION MICRODISCECTOMY N/A 07/26/2016   Procedure: Central decompression, lumbar laminectomy L4-L5 and L5-S1,  foraminotmy L5-S1 and L4-L5 bilateral;  Surgeon: Latanya Maudlin, MD;  Location: [facility];  Service: Orthopedics;  Laterality: N/A;   NEPHRECTOMY  1970's   Rt. "Shrivelled up" ,  no cancer.    POLYPECTOMY       OB History   No obstetric history on file.      Home Medications    Prior to Admission medications   Medication Sig Start Date End Date Taking? Authorizing Provider  amLODipine (NORVASC) 2.5 MG tablet Take 1 tablet (2.5 mg total) by mouth daily. 11/22/18  Yes Eugenie Filler, MD  donepezil (ARICEPT) 10 MG tablet Take 10 mg by mouth at bedtime.  09/25/14  Yes [provider]  memantine (NAMENDA) 10 MG tablet Take 10 mg by mouth 2 (two) times daily. 04/20/16  Yes [provider]  oxybutynin (DITROPAN) 5 MG tablet Take 5 mg by mouth 2 (two) times daily. 09/08/14  Yes [provider]  venlafaxine XR (EFFEXOR-XR) 75 MG 24 hr capsule Take 75 mg by mouth daily with breakfast.   Yes [provider]  acetaminophen (TYLENOL) 325 MG tablet Take 650 mg by mouth every 6 (six) hours as needed for mild pain.     [provider]  aspirin EC 81 MG tablet Take 81 mg by mouth daily.    [provider]  Cholecalciferol (VITAMIN D3) 50 MCG (2000 UT) capsule Take 2,000 Units by mouth daily.    [provider]  docusate sodium (  COLACE) 100 MG capsule Take 100 mg by mouth at bedtime.    [provider]  feeding supplement, ENSURE ENLIVE, (ENSURE ENLIVE) LIQD Take 237 mLs by mouth 2 (two) times daily between meals. 08/10/18   Sheikh, Georgina Quint Latif, DO  Maltodextrin-Xanthan Gum (RESOURCE THICKENUP CLEAR) POWD 1 scoop  For 4 oz liquid, 2 scoops for eight oz and add liquids. 11/21/18   Eugenie Filler, MD  Multiple Vitamin (MULITIVITAMIN WITH MINERALS) TABS Take 1 tablet by mouth daily.    [provider]  nitrofurantoin (MACRODANTIN) 100 MG capsule Take 100 mg by mouth at bedtime. 01/13/19   [provider]  Omega-3 Fatty  Acids (FISH OIL) 1000 MG CAPS Take 1,000 mg by mouth daily.     [provider]  Red Yeast Rice 600 MG TABS Take 600 mg by mouth daily.    [provider]  trimethoprim (TRIMPEX) 100 MG tablet Take 1 tablet (100 mg total) by mouth daily. 11/24/18   Eugenie Filler, MD  vitamin B-12 (CYANOCOBALAMIN) 1000 MCG tablet Take 1,000 mcg by mouth daily.    [provider]  vitamin C (ASCORBIC ACID) 500 MG tablet Take 500 mg by mouth daily.      [provider]  VITAMIN E PO Take 1 tablet by mouth daily.    [provider]    Family History Family History  Problem Relation Age of Onset   Coronary artery disease Father    Leukemia Brother    Colon cancer Brother    Breast cancer Sister    Ovarian cancer Sister    Diabetes Daughter     Social History Social History   Tobacco Use   Smoking status: Never Smoker   Smokeless tobacco: Never Used  Substance Use Topics   Alcohol use: No   Drug use: No     Allergies   Tape and Tilapia [fish allergy]   Review of Systems Review of Systems  Unable to perform ROS: Dementia     Physical Exam Updated Vital Signs BP (!) 198/53    Pulse (!) 38    Temp 98.4 F (36.9 C) (Oral)    Resp 17    Ht 5\' 4"  (1.626 m)    Wt 63 kg    SpO2 96%    BMI 23.84 kg/m   Physical Exam Vitals signs and nursing note reviewed.  Constitutional:      General: She is not in acute distress.    Appearance: She is well-developed. She is not ill-appearing, toxic-appearing or diaphoretic.  HENT:     Head: Normocephalic and atraumatic.     Right Ear: External ear normal.     Left Ear: External ear normal.  Eyes:     Conjunctiva/sclera: Conjunctivae normal.     Pupils: Pupils are equal, round, and reactive to light.  Neck:     Musculoskeletal: Normal range of motion and neck supple.     Trachea: Phonation normal.  Cardiovascular:     Rate and Rhythm: Normal rate and regular rhythm.     Heart sounds: Normal  heart sounds.  Pulmonary:     Effort: Pulmonary effort is normal.     Breath sounds: Normal breath sounds.  Abdominal:     Palpations: Abdomen is soft.     Tenderness: There is no abdominal tenderness.  Musculoskeletal: Normal range of motion.  Skin:    General: Skin is warm and dry.  Neurological:     Mental Status: She is alert  and oriented to person, place, and time.     Cranial Nerves: No cranial nerve deficit.     Sensory: No sensory deficit.     Motor: No abnormal muscle tone.     Coordination: Coordination normal.  Psychiatric:        Mood and Affect: Mood normal.        Behavior: Behavior normal.      ED Treatments / Results  Labs (all labs ordered are listed, but only abnormal results are displayed) Labs Reviewed  BASIC METABOLIC PANEL - Abnormal; Notable for the following components:      Result Value   Potassium 5.3 (*)    BUN 29 (*)    Creatinine, Ser 1.31 (*)    Calcium 10.5 (*)    GFR calc non Af Amer 37 (*)    GFR calc Af Amer 43 (*)    All other components within normal limits  URINALYSIS, ROUTINE W REFLEX MICROSCOPIC - Abnormal; Notable for the following components:   Color, Urine AMBER (*)    APPearance CLOUDY (*)    Protein, ur 100 (*)    Leukocytes,Ua LARGE (*)    WBC, UA >50 (*)    Bacteria, UA MANY (*)    All other components within normal limits  CBC WITH DIFFERENTIAL/PLATELET  CBC WITH DIFFERENTIAL/PLATELET    EKG EKG Interpretation  Date/Time:  Sunday February 16 2019 15:40:43 EST Ventricular Rate:  39 PR Interval:    QRS Duration: 118 QT Interval:  582 QTC Calculation: 469 R Axis:   58 Text Interpretation: Sinus bradycardia Prolonged PR interval Nonspecific intraventricular conduction delay Probable anterior infarct, age indeterminate Abnormal T, consider ischemia, inferior leads Since last tracing rate slower Fixed alternate non-conducted p wave Baseline wander Confirmed by Daleen Bo [phone]) on 02/16/2019 7:46:52 PM   EKG  Interpretation  Date/Time:  Sunday February 16 2019 21:10:44 EST Ventricular Rate:  38 PR Interval:    QRS Duration: 117 QT Interval:  517 QTC Calculation: 411 R Axis:   57 Text Interpretation: AV block, complete (third degree) Nonspecific intraventricular conduction delay Probable anterior infarct, age indeterminate Since last tracing of earlier today now in complete heart block Confirmed by Daleen Bo [phone]) on 02/16/2019 9:16:18 PM        Radiology No results found.  Procedures .Critical Care Performed by: Daleen Bo, MD Authorized by: Daleen Bo, MD   Critical care provider statement:    Critical care time (minutes):  105   Critical care start time:  02/16/2019 3:35 PM   Critical care end time:  02/16/2019 9:30 PM   Critical care time was exclusive of:  Separately billable procedures and treating other patients   Critical care was necessary to treat or prevent imminent or life-threatening deterioration of the following conditions:  Cardiac failure   Critical care was time spent personally by me on the following activities:  Blood draw for specimens, development of treatment plan with patient or surrogate, discussions with consultants, evaluation of patient's response to treatment, examination of patient, obtaining history from patient or surrogate, ordering and performing treatments and interventions, ordering and review of laboratory studies, pulse oximetry, re-evaluation of patient's condition, review of old charts and ordering and review of radiographic studies   (including critical care time)  Medications Ordered in ED Medications  cefTRIAXone (ROCEPHIN) 1 g in sodium chloride 0.9 % 100 mL IVPB (1 g Intravenous New Bag/Given 02/16/19 1950)  sodium chloride 0.9 % bolus 1,000 mL (1,000 mLs Intravenous New Bag/Given  02/16/19 1935)     Initial Impression / Assessment and Plan / ED Course  I have reviewed the triage vital signs and the nursing  notes.  Pertinent labs & imaging results that were available during my care of the patient were reviewed by me and considered in my medical decision making (see chart for details).  Clinical Course as of Feb 16 1242  Shannon Blair Feb 16, 2019  1926 Normal except for presence of protein, leukocytes, RBCs, WBCs, bacteria  Urinalysis, Routine w reflex microscopic(!) [EW]  1927 Normal except potassium high, BUN high creatinine high, calcium high, GFR low  Basic metabolic panel(!) [EW]  [id number] Normal  CBC with Differential/Platelet [EW]  2006 Tish Men, POA, case discussed and questions answered.    [EW]  2012 Additional history from her POA, she started patient on Keflex for suspected UTI, 3 days ago.  This prescription has been written by her urologist because of frequent UTIs.  Patient has been generally weak, unable to walk, not eating much and not acting herself, recently.  This has been going on for 3 days.  Patient has dementia baseline but is usually functional at home, able to care for herself in her own home with help from friends and family members.  RBC / HPF: 21-50 [EW]  2145 Attempted to page cardiology several times without return a call.  Care transferred to oncoming provider team to discuss with cardiology, to ensure proper cardiac care, then arrange admission.   [EW]    Clinical Course User Index [EW] Daleen Bo, MD        Patient Vitals for the past 24 hrs:  BP Temp Temp src Pulse Resp SpO2 Height Weight  02/16/19 1915 (!) 198/53 -- -- (!) 38 17 96 % -- --  02/16/19 1905 (!) 230/41 -- -- (!) 39 (!) 22 97 % -- --  02/16/19 1745 (!) 210/52 -- -- (!) 38 17 97 % -- --  02/16/19 1730 (!) 234/56 -- -- (!) 38 13 99 % -- --  02/16/19 1715 (!) 234/57 -- -- (!) 38 16 98 % -- --  02/16/19 1700 (!) 224/59 -- -- (!) 39 20 97 % -- --  02/16/19 1645 (!) 234/48 -- -- (!) 38 19 98 % -- --  02/16/19 1541 (!) 229/56 98.4 F (36.9 C) Oral -- -- -- -- --  02/16/19 1538 -- -- -- -- -- -- 5'  4" (1.626 m) 63 kg  02/16/19 1537 -- -- -- (!) 50 -- 96 % -- --      Medical Decision Making: Patient presenting with general malaise, and has incidental bradycardia.  Initial twelve-lead did not indicate clear evidence for heart block.  EKG repeated does show complete heart block.  Patient with hypertension during ED stay, without signs and symptoms of acute encephalopathy or hypertensive urgency.  No clear cause for bradycardia/complete heart block.  Screening evaluation indicates urine infection, without signs for severe sepsis.  Patient treated with parenteral fluids and antibiotics.  She will require hospitalization.  Patient is critically ill.  Shannon Blair was evaluated in Emergency Department on 02/17/2019 for the symptoms described in the history of present illness. She was evaluated in the context of the global COVID-19 pandemic, which necessitated consideration that the patient might be at risk for infection with the SARS-CoV-2 virus that causes COVID-19. Institutional protocols and algorithms that pertain to the evaluation of patients at risk for COVID-19 are in a state of rapid change based on information released  by regulatory bodies including the CDC and federal and state organizations. These policies and algorithms were followed during the patient's care in the ED.   CRITICAL CARE- yes Performed by: Daleen Bo  Nursing Notes Reviewed/ Care Coordinated Applicable Imaging Reviewed Interpretation of Laboratory Data incorporated into ED treatment   Plan: Admit  Final Clinical Impressions(s) / ED Diagnoses   Final diagnoses:  Urinary tract infection without hematuria, site unspecified  CHB (complete heart block) [facility])    ED Discharge Orders    None       Daleen Bo, MD 02/17/19 1246

## 2019-02-16 NOTE — ED Provider Notes (Signed)
Patient care assumed at 2200. Patient care with weakness. Has complete heart block, UTI. Cardiology consulted, Dr. Radford Pax will see the patient and consult. Hospitalist consulted for admission.   Quintella Reichert, MD 02/17/19 Laureen Abrahams

## 2019-02-16 NOTE — ED Triage Notes (Signed)
Ems reports that the pt has become progressively weaker in the last 48hrs, not eating. Not taking medications. Pt has a hxt of dementia.   Pt coming from home. Increased confusion.  Hr 40, [id number] systolic cbg [id number]

## 2019-02-16 NOTE — ED Notes (Signed)
Pt able to void. Urine collected from bedpan.

## 2019-02-16 NOTE — H&P (Signed)
TRH H&P    Patient Demographics:    Shannon Blair, is a 85 y.o. female  MRN: [id number]  DOB - Aug 30, 1931  Admit Date - 02/16/2019  Referring MD/NP/PA:    Outpatient Primary MD for the patient is Merrilee Seashore, MD  Patient coming from:  home  Chief complaint-  Weakness, POA thought she might have uti   HPI:    Shannon Blair  is a 85 y.o. female,  w dementia, hypertension, hx of recurrent uti, apparently presents with generalized weakness, and thought to have UTI.  Pt is unable to provide meaningful history due to dementia.   In ED,  T 98.4 P 50 R 20 Bp 229/56  Pox 96% on RA Wt 63kg   Na 140, K 5.3 Bun 29, Creatinine 1.31 Calcium 10.5 Wbc 8.4, Hgb 13.2, Plt 222  Urinalysis wbc >50, rbc 21-50   Ekg complete hb, ventricular rate 60  Cardiology consulted by ED, ED states that they spoke with Fransico Him and that she requested medical admission.   I personally thought that she is inappropriate for a stepdown bed with CHB, and called EICU .   I will admit patient to ICU 2H because that is what is best for the patient.          Review of systems:    In addition to the HPI above, unable to obtain due to dementia No Fever-chills, No Headache, No changes with Vision or hearing, No problems swallowing food or Liquids, No Chest pain, Cough or Shortness of Breath, No Abdominal pain, No Nausea or Vomiting, bowel movements are regular, No Blood in stool or Urine  No dysuria, No new skin rashes or bruises, No new joints pains-aches,  No new weakness, tingling, numbness in any extremity, No recent weight gain or loss, No polyuria, polydypsia or polyphagia, No significant Mental Stressors.  All other systems reviewed and are negative.    Past History of the following :    Past Medical History:  Diagnosis Date  . Anal cancer ([place]) 2004   Non-surgical mgt with chemo-radiation.   Dr Alen Blew follows.   . Anal fissure   . Colon, diverticulosis    Sigmoid. With associated stenosis.  Colonoscopy failed to intubate beyond stenosis in 08/2010.  rectal bx then negative for recurrent cancer.   . Dementia ([place])   . Hypertension   . OA (osteoarthritis)   . Squamous cell carcinoma    skin  . Varicose veins of left lower extremity with pain       Past Surgical History:  Procedure Laterality Date  . ABDOMINAL HYSTERECTOMY    . APPENDECTOMY    . BLADDER REPAIR    . CATARACT EXTRACTION  05/2004    dr Ishmael Holter  . LUMBAR LAMINECTOMY/DECOMPRESSION MICRODISCECTOMY N/A 07/26/2016   Procedure: Central decompression, lumbar laminectomy L4-L5 and L5-S1, foraminotmy L5-S1 and L4-L5 bilateral;  Surgeon: Latanya Maudlin, MD;  Location: [facility];  Service: Orthopedics;  Laterality: N/A;  . NEPHRECTOMY  1970's   Rt. "Shrivelled up" ,  no cancer.   Marland Kitchen  POLYPECTOMY        Social History:      Social History   Tobacco Use  . Smoking status: Never Smoker  . Smokeless tobacco: Never Used  Substance Use Topics  . Alcohol use: No       Family History :     Family History  Problem Relation Age of Onset  . Coronary artery disease Father   . Leukemia Brother   . Colon cancer Brother   . Breast cancer Sister   . Ovarian cancer Sister   . Diabetes Daughter        Home Medications:   Prior to Admission medications   Medication Sig Start Date End Date Taking? Authorizing Provider  acetaminophen (TYLENOL) 650 MG CR tablet Take 650 mg by mouth 2 (two) times daily.   Yes [provider]  amLODipine (NORVASC) 2.5 MG tablet Take 1 tablet (2.5 mg total) by mouth daily. 11/22/18  Yes Eugenie Filler, MD  aspirin EC 81 MG tablet Take 81 mg by mouth daily after breakfast.    Yes [provider]  cephALEXin (KEFLEX) 500 MG capsule Take 500 mg by mouth 2 (two) times daily. 02/13/19 02/17/19 Yes [provider]  Cholecalciferol (VITAMIN D3) 50 MCG (2000 UT) capsule  Take 2,000 Units by mouth daily with breakfast.    Yes [provider]  docusate sodium (COLACE) 100 MG capsule Take 100 mg by mouth at bedtime.   Yes [provider]  donepezil (ARICEPT) 10 MG tablet Take 10 mg by mouth at bedtime.  09/25/14  Yes [provider]  feeding supplement, ENSURE ENLIVE, (ENSURE ENLIVE) LIQD Take 237 mLs by mouth 2 (two) times daily between meals. 08/10/18  Yes Sheikh, Omair Latif, DO  memantine (NAMENDA) 10 MG tablet Take 10 mg by mouth 2 (two) times daily. 04/20/16  Yes [provider]  Misc Natural Products (OSTEO BI-FLEX ADV JOINT SHIELD) TABS Take 1 tablet by mouth daily with breakfast.   Yes [provider]  Multiple Vitamin (MULITIVITAMIN WITH MINERALS) TABS Take 1 tablet by mouth daily with breakfast.    Yes [provider]  nitrofurantoin (MACRODANTIN) 100 MG capsule Take 100 mg by mouth at bedtime. 01/13/19  Yes [provider]  Omega-3 Fatty Acids (FISH OIL) 1000 MG CAPS Take 1,000 mg by mouth daily with breakfast.    Yes [provider]  oxybutynin (DITROPAN) 5 MG tablet Take 5 mg by mouth 2 (two) times daily. 09/08/14  Yes [provider]  polyethylene glycol powder (GLYCOLAX/MIRALAX) 17 GM/SCOOP powder Take 17 g by mouth daily after breakfast. MIX AND DRINK   Yes [provider]  Red Yeast Rice 600 MG TABS Take 600 mg by mouth daily with breakfast.    Yes [provider]  venlafaxine XR (EFFEXOR-XR) 75 MG 24 hr capsule Take 75 mg by mouth daily with breakfast.   Yes [provider]  vitamin B-12 (CYANOCOBALAMIN) 1000 MCG tablet Take 1,000 mcg by mouth daily with breakfast.    Yes [provider]  vitamin C (ASCORBIC ACID) 500 MG tablet Take 500 mg by mouth daily with breakfast.    Yes [provider]  VITAMIN E PO Take 1 capsule by mouth daily with breakfast.    Yes [provider]  Maltodextrin-Xanthan Gum (RESOURCE THICKENUP  CLEAR) POWD 1 scoop  For 4 oz liquid, 2 scoops for eight oz and add liquids. Patient not taking: Reported on 02/16/2019 11/21/18   Eugenie Filler,  MD  trimethoprim (TRIMPEX) 100 MG tablet Take 1 tablet (100 mg total) by mouth daily. Patient not taking: Reported on 02/16/2019 11/24/18   Eugenie Filler, MD     Allergies:     Allergies  Allergen Reactions  . Tape Other (See Comments)    SKIN IS VERY THIN, SO PLEASE USE PAPER TAPE ONLY!!  . Tilapia [Fish Allergy] Rash and Other (See Comments)    Facial rash     Physical Exam:   Vitals  Blood pressure (!) 198/53, pulse (!) 38, temperature 98.4 F (36.9 C), temperature source Oral, resp. rate 17, height 5\' 4"  (1.626 m), weight 63 kg, SpO2 96 %.  1.  General:  axoxo1 (person< not place or time)  2. Psychiatric: euthymic  3. Neurologic: Nonfocal, cn2-12 intact, reflexes 2+ symmetric, diffuse with no clonus, motor 5/5 in all 4 ext  4. HEENMT:  Anicteric, pupils 1.33mm symmetric, direct, consensual , near intact Neck: no jvd  5. Respiratory : CTAB  6. Cardiovascular : Irr, irr, s1, s2, 1/6 sem apex  7. Gastrointestinal:  Abd: soft, obese, nt, nd, +bs  8. Skin:  Ext: no c/c/e,   9.Musculoskeletal:  Good ROM    Data Review:    CBC Recent Labs  Lab 02/16/19 1802  WBC 8.4  HGB 13.2  HCT 38.7  PLT 222  MCV 89.2  MCH 30.4  MCHC 34.1  RDW 13.8  LYMPHSABS 1.7  MONOABS 0.6  EOSABS 0.4  BASOSABS 0.1   ------------------------------------------------------------------------------------------------------------------  Results for orders placed or performed during the hospital encounter of 02/16/19 (from the past 48 hour(s))  Basic metabolic panel     Status: Abnormal   Collection Time: 02/16/19  4:00 PM  Result Value Ref Range   Sodium 140 135 - 145 mmol/L   Potassium 5.3 (H) 3.5 - 5.1 mmol/L    Comment: SLIGHT HEMOLYSIS   Chloride 107 98 - 111 mmol/L   CO2 22 22 - 32 mmol/L   Glucose, Bld 85 70 - 99  mg/dL   BUN 29 (H) 8 - 23 mg/dL   Creatinine, Ser 1.31 (H) 0.44 - 1.00 mg/dL   Calcium 10.5 (H) 8.9 - 10.3 mg/dL   GFR calc non Af Amer 37 (L) >60 mL/min   GFR calc Af Amer 43 (L) >60 mL/min   Anion gap 11 5 - 15    Comment: Performed at [facility], [address], [place], [place] [address]  CBC with Differential/Platelet     Status: None   Collection Time: 02/16/19  6:02 PM  Result Value Ref Range   WBC 8.4 4.0 - 10.5 K/uL   RBC 4.34 3.87 - 5.11 MIL/uL   Hemoglobin 13.2 12.0 - 15.0 g/dL   HCT 38.7 36.0 - 46.0 %   MCV 89.2 80.0 - 100.0 fL   MCH 30.4 26.0 - 34.0 pg   MCHC 34.1 30.0 - 36.0 g/dL   RDW 13.8 11.5 - 15.5 %   Platelets 222 150 - 400 K/uL   nRBC 0.0 0.0 - 0.2 %   Neutrophils Relative % 65 %   Neutro Abs 5.5 1.7 - 7.7 K/uL   Lymphocytes Relative 21 %   Lymphs Abs 1.7 0.7 - 4.0 K/uL   Monocytes Relative 7 %   Monocytes Absolute 0.6 0.1 - 1.0 K/uL   Eosinophils Relative 5 %   Eosinophils Absolute 0.4 0.0 - 0.5 K/uL   Basophils Relative 1 %   Basophils Absolute 0.1 0.0 - 0.1  K/uL   Immature Granulocytes 1 %   Abs Immature Granulocytes 0.06 0.00 - 0.07 K/uL    Comment: Performed at [facility], [place] [address], [place], [place] [address]  Urinalysis, Routine w reflex microscopic     Status: Abnormal   Collection Time: 02/16/19  6:07 PM  Result Value Ref Range   Color, Urine AMBER (A) YELLOW    Comment: BIOCHEMICALS MAY BE AFFECTED BY COLOR   APPearance CLOUDY (A) CLEAR   Specific Gravity, Urine 1.020 1.005 - 1.030   pH 6.0 5.0 - 8.0   Glucose, UA NEGATIVE NEGATIVE mg/dL   Hgb urine dipstick NEGATIVE NEGATIVE   Bilirubin Urine NEGATIVE NEGATIVE   Ketones, ur NEGATIVE NEGATIVE mg/dL   Protein, ur 100 (A) NEGATIVE mg/dL   Nitrite NEGATIVE NEGATIVE   Leukocytes,Ua LARGE (A) NEGATIVE   RBC / HPF 21-50 0 - 5 RBC/hpf   WBC, UA >50 (H) 0 - 5 WBC/hpf   Bacteria, UA MANY (A) NONE SEEN   Squamous Epithelial / LPF 0-5 0 - 5   Mucus PRESENT     Comment:  Performed at [facility], [address] [place], [place] [address]    Chemistries  Recent Labs  Lab 02/16/19 1600  NA 140  K 5.3*  CL 107  CO2 22  GLUCOSE 85  BUN 29*  CREATININE 1.31*  CALCIUM 10.5*   ------------------------------------------------------------------------------------------------------------------  ------------------------------------------------------------------------------------------------------------------ GFR: Estimated Creatinine Clearance: 27.6 mL/min (A) (by C-G formula based on SCr of 1.31 mg/dL (H)). Liver Function Tests: No results for input(s): AST, ALT, ALKPHOS, BILITOT, PROT, ALBUMIN in the last 168 hours. No results for input(s): LIPASE, AMYLASE in the last 168 hours. No results for input(s): AMMONIA in the last 168 hours. Coagulation Profile: No results for input(s): INR, PROTIME in the last 168 hours. Cardiac Enzymes: No results for input(s): CKTOTAL, CKMB, CKMBINDEX, TROPONINI in the last 168 hours. BNP (last 3 results) No results for input(s): PROBNP in the last 8760 hours. HbA1C: No results for input(s): HGBA1C in the last 72 hours. CBG: No results for input(s): GLUCAP in the last 168 hours. Lipid Profile: No results for input(s): CHOL, HDL, LDLCALC, TRIG, CHOLHDL, LDLDIRECT in the last 72 hours. Thyroid Function Tests: No results for input(s): TSH, T4TOTAL, FREET4, T3FREE, THYROIDAB in the last 72 hours. Anemia Panel: No results for input(s): VITAMINB12, FOLATE, FERRITIN, TIBC, IRON, RETICCTPCT in the last 72 hours.  --------------------------------------------------------------------------------------------------------------- Urine analysis:    Component Value Date/Time   COLORURINE AMBER (A) 02/16/2019 1807   APPEARANCEUR CLOUDY (A) 02/16/2019 1807   LABSPEC 1.020 02/16/2019 1807   PHURINE 6.0 02/16/2019 1807   GLUCOSEU NEGATIVE 02/16/2019 1807   HGBUR NEGATIVE 02/16/2019 1807   BILIRUBINUR NEGATIVE 02/16/2019 1807    KETONESUR NEGATIVE 02/16/2019 1807   PROTEINUR 100 (A) 02/16/2019 1807   UROBILINOGEN 0.2 06/25/2011 1028   NITRITE NEGATIVE 02/16/2019 1807   LEUKOCYTESUR LARGE (A) 02/16/2019 1807      Imaging Results:    No results found.     Assessment & Plan:    Active Problems:   Complete heart block (HCC)  Complete heart block Admit to ICU Correct hyperkalemia STOP Oxybutyin -> can affect conduction STOP Aricept -> can cause heart block Check lyme titer  Check tsh Check trop I q2h x2 CXR Check cardiac echo EICU Oletta Darter) states that critical care will see patient Cardiology consulted by ED  Acute lower uti Blood culture x2 Urine culture Rocephin 1gm iv qday  Hypertension uncontrolled  Cont Amlodipine 2.5mg  po qday Hydralazine 5mg  iv q6h prn sbp >160  Dementia STOP Aricept STOP Namenda -> can induce bradycardia STOP Effexor -> can worsen hypertension  Urinary incontinence STOP oxybutynin  Recurrent UTI Avoid macrobid, can affect conduction       DVT Prophylaxis-   Lovenox - SCDs   AM Labs Ordered, also please review Full Orders  Family Communication: Admission, patients condition and plan of care including tests being ordered have been discussed with the patient  who indicate understanding and agree with the plan and Code Status.  Code Status:  DNR per POA , POA will think about pacer,  Dr. Radford Pax will discuss this with her further.   Admission status:  Inpatient: Based on patients clinical presentation and evaluation of above clinical data, I have made determination that patient meets Inpatient criteria at this time.    Time spent in minutes :  60 minutes critical care time.    Jani Gravel M.D on 02/16/2019 at 11:08 PM

## 2019-02-16 NOTE — Consult Note (Signed)
Admit date: 02/16/2019 Referring Physician  Dr. Quintella Reichert Primary Physician  Merrilee Seashore, MD Primary Cardiologist  None (new) Reason for Consultation  Complete heart block  HPI: Shannon Blair is a 85 y.o. female who is being seen today for the evaluation of complete heart block at the request of Quintella Reichert, MD.  This is an 85yo female with a hx of Dementia and HTN who was in her USOH until 48 hours ago when she became weak with no appetite and stopped taking her meds.  This hx was gathered from the ER chart.  She was brought from home with confusion.  In ER HR noted to be in the 40's and EKG showed complete heart block with ventricular escape rhythm at 38bpm.  She was very hypertensive with BP 222/98mmHg.  Also noted to have a UTI.  Creatinine mildly elevated at 1.31 and BUN 29.  Cardiology is now asked to consult.  She has not been on any rate slowing meds.  On my exam she is very confused and appears to have significant dementia.  She tells me she does not know why she is here.  At first she said she had had dizziness but then denied any dizziness. She denies any CP, SOB, PND, orthopnea, dizziness or syncope.  She denies any LE edema but has apparent edema on exam.    PMH:   Past Medical History:  Diagnosis Date  . Anal cancer ([place]) 2004   Non-surgical mgt with chemo-radiation.  Dr Alen Blew follows.   . Anal fissure   . Colon, diverticulosis    Sigmoid. With associated stenosis.  Colonoscopy failed to intubate beyond stenosis in 08/2010.  rectal bx then negative for recurrent cancer.   . Dementia ([place])   . Hypertension   . OA (osteoarthritis)   . Squamous cell carcinoma    skin  . Varicose veins of left lower extremity with pain      PSH:   Past Surgical History:  Procedure Laterality Date  . ABDOMINAL HYSTERECTOMY    . APPENDECTOMY    . BLADDER REPAIR    . CATARACT EXTRACTION  05/2004    dr Ishmael Holter  . LUMBAR LAMINECTOMY/DECOMPRESSION MICRODISCECTOMY N/A  07/26/2016   Procedure: Central decompression, lumbar laminectomy L4-L5 and L5-S1, foraminotmy L5-S1 and L4-L5 bilateral;  Surgeon: Latanya Maudlin, MD;  Location: [facility];  Service: Orthopedics;  Laterality: N/A;  . NEPHRECTOMY  1970's   Rt. "Shrivelled up" ,  no cancer.   Marland Kitchen POLYPECTOMY      Allergies:  Tape and Tilapia [fish allergy] Prior to Admit Meds:  (Not in a hospital admission)  Fam HX:    Family History  Problem Relation Age of Onset  . Coronary artery disease Father   . Leukemia Brother   . Colon cancer Brother   . Breast cancer Sister   . Ovarian cancer Sister   . Diabetes Daughter    Social HX:    Social History   Socioeconomic History  . Marital status: Widowed    Spouse name: Not on file  . Number of children: 2  . Years of education: Not on file  . Highest education level: Not on file  Occupational History  . Occupation: RETIRED    Employer: RETIRED  Social Needs  . Financial resource strain: Not on file  . Food insecurity    Worry: Not on file    Inability: Not on file  . Transportation needs    Medical: Not on file  Non-medical: Not on file  Tobacco Use  . Smoking status: Never Smoker  . Smokeless tobacco: Never Used  Substance and Sexual Activity  . Alcohol use: No  . Drug use: No  . Sexual activity: Not Currently  Lifestyle  . Physical activity    Days per week: Not on file    Minutes per session: Not on file  . Stress: Not on file  Relationships  . Social Herbalist on phone: Not on file    Gets together: Not on file    Attends religious service: Not on file    Active member of club or organization: Not on file    Attends meetings of clubs or organizations: Not on file    Relationship status: Not on file  . Intimate partner violence    Fear of current or ex partner: Not on file    Emotionally abused: Not on file    Physically abused: Not on file    Forced sexual activity: Not on file  Other Topics Concern  . Not on file   Social History Narrative  . Not on file     ROS:  All  ROS were addressed and are negative except what is stated in the HPI  Physical Exam: Blood pressure (!) 198/53, pulse (!) 38, temperature 98.4 F (36.9 C), temperature source Oral, resp. rate 17, height 5\' 4"  (1.626 m), weight 63 kg, SpO2 96 %.    General: Well developed, well nourished, in no acute distress Head: Eyes PERRLA, No xanthomas.   Normal cephalic and atramatic  Lungs:   Clear bilaterally to auscultation and percussion. Heart:   HRRR S1 S2 Pulses are 2+ & equal.            No carotid bruit. No JVD.  No abdominal bruits. No femoral bruits. Abdomen: Bowel sounds are positive, abdomen soft and non-tender without masses or                  Hernia's noted. Msk:  Back normal, normal gait. Normal strength and tone for age. Extremities:   No clubbing, cyanosis or edema.  DP +1 Neuro: Alert and oriented X 3. Psych:  Good affect, responds appropriately    Labs:   Lab Results  Component Value Date   WBC 8.4 02/16/2019   HGB 13.2 02/16/2019   HCT 38.7 02/16/2019   MCV 89.2 02/16/2019   PLT 222 02/16/2019    Recent Labs  Lab 02/16/19 1600  NA 140  K 5.3*  CL 107  CO2 22  BUN 29*  CREATININE 1.31*  CALCIUM 10.5*  GLUCOSE 85   No results found for: PTT Lab Results  Component Value Date   INR 1.02 07/18/2016   INR 1.02 06/25/2011   Lab Results  Component Value Date   TROPONINI 0.03 ([place]) 08/06/2018    No results found for: CHOL No results found for: HDL No results found for: LDLCALC No results found for: TRIG No results found for: CHOLHDL No results found for: LDLDIRECT    Radiology:  No results found.   Telemetry    NSR with complete heart block - Personally Reviewed  ECG    NSR with complete heart block and ventricular escape - Personally Reviewed   ASSESSMENT/PLAN:   1.  Complete heart block  -EKG shows complete heart block with slow ventricular escape rhythm at 38bpm -she is not on any  AVN blocking agents -electrolytes normal -check TSH -check 2D echo -telemetry monitoring  in step down -she is hemodynamically stable -no indication for temp pacer at present -make NPO after MN -EP consult in am  -I have discussed her status with her POA, Joann Rice in regards to if a PPM would be something that the patient would want.  I am not sure patient can comprehend what is going to make a decision for herself on PPM.  She told me that if a pacer was necessary to go ahead but her POA said that she told her a few weeks ago she wanted to die.  I will have the EP consulting MD call her POA in am to discuss pacer further.   2.  Hypertensive urgency -BP on admit 234/63mmHg -IV Hydralazine PRN -has hx of CKD with creatinine as high as 2 in the past so would avoid ACE/ARB  3.  UTI -per TRH  4.  Dementia -per TRH  5.  CKD stage 3a -followed by PCP -per TR[facility]  Fransico Him, MD  02/16/2019  10:50 PM

## 2019-02-16 NOTE — Consult Note (Signed)
NAME:  Shannon Blair, MRN:  [id number], DOB:  [birth year], LOS: 0 ADMISSION DATE:  02/16/2019, CONSULTATION DATE:  02/16/2019 REFERRING MD:  TR[facility] Jani Gravel, MD, CHIEF COMPLAINT:  Complete heart block   Brief History   85 year old woman with history of anal cancer in 2004 s/p chemo/radiation, dementia, HTN presented with weakness found to be in complete heart block and hypertensive urgency.  History of present illness   85 year old woman with history of anal cancer in 2004 s/p chemo/radiation, dementia, HTN presented with weakness found to be in complete heart block. History obtained from EMR due to patient's dementia. She has been having generalized weakness, poor appetite for the past 3 days. Her urologist prescribed Keflex for suspected UTI and history of recurrent UTI. Baseline she is functional at home with help from friends and family. Presently, she does not correctly recall why she is here. She has no complaints.   Past Medical History  anal cancer in 2004 s/p chemo/radiation, dementia, HTN  Significant Hospital Events   11/29> Admit  Consults:  Cardiology  Procedures:  None  Significant Diagnostic Tests:  EKG 12/29: complete heart block  Micro Data:  None  Antimicrobials:  Ceftriaxone 11/29>  Interim history/subjective:  Lying in bed and denies any chest pain or dyspnea.  Objective   Blood pressure (!) 198/53, pulse (!) 38, temperature 98.4 F (36.9 C), temperature source Oral, resp. rate 17, height 5\' 4"  (1.626 m), weight 63 kg, SpO2 96 %.       No intake or output data in the 24 hours ending 02/16/19 2330 Filed Weights   02/16/19 1538  Weight: 63 kg    Examination: General: NAD HENT: [place]/AT, MMM, EOMI Lungs: Clear bilaterally with no wheezes Cardiovascular: Irregular rhythm Abdomen: Soft, nondistended, nontender Extremities: Trace pedal edema Neuro: Alert, conversant and follows commands, strength intact in all extremities, oriented x 1 (person)    Assessment & Plan:  85 year old woman with history of anal cancer in 2004 s/p chemo/radiation, dementia, HTN presented with weakness found to be in complete heart block and hypertensive urgency.  Complete heart block: --Cardiology consulted, appreciate recs --Follow up TSH --Avoid nodal blocking agents --Trend trops --Follow up on TTE --Mild hyperkalemia but slight hemolysis on lab result. Repeat K.   UTI: --Agree with rocephin --Follow up cultures  Hypertensive urgency: --Restart amlodipine. Consider increasing dose --Hydralazine prn. May consider PO.  --Goal BP systolic around 99991111  Best practice:  Diet: NPO p MN Pain/Anxiety/Delirium protocol (if indicated): N/A VAP protocol (if indicated): N/A DVT prophylaxis: Lovenox GI prophylaxis: N/A Glucose control: Monitor Mobility: OOB Code Status: DNR Family Communication: Per primary Disposition: ICU  Labs   CBC: Recent Labs  Lab 02/16/19 1802  WBC 8.4  NEUTROABS 5.5  HGB 13.2  HCT 38.7  MCV 89.2  PLT [id number]    Basic Metabolic Panel: Recent Labs  Lab 02/16/19 1600  NA 140  K 5.3*  CL 107  CO2 22  GLUCOSE 85  BUN 29*  CREATININE 1.31*  CALCIUM 10.5*   GFR: Estimated Creatinine Clearance: 27.6 mL/min (A) (by C-G formula based on SCr of 1.31 mg/dL (H)). Recent Labs  Lab 02/16/19 1802  WBC 8.4    Liver Function Tests: No results for input(s): AST, ALT, ALKPHOS, BILITOT, PROT, ALBUMIN in the last 168 hours. No results for input(s): LIPASE, AMYLASE in the last 168 hours. No results for input(s): AMMONIA in the last 168 hours.  ABG No results found for: PHART, PCO2ART, PO2ART, HCO3, TCO2,  ACIDBASEDEF, O2SAT   Coagulation Profile: No results for input(s): INR, PROTIME in the last 168 hours.  Cardiac Enzymes: No results for input(s): CKTOTAL, CKMB, CKMBINDEX, TROPONINI in the last 168 hours.  HbA1C: No results found for: HGBA1C  CBG: No results for input(s): GLUCAP in the last 168 hours.   Review of Systems:   Unable to obtain due to patient's dementia  Past Medical History  She,  has a past medical history of Anal cancer ([place]) (2004), Anal fissure, Colon, diverticulosis, Dementia ([place]), Hypertension, OA (osteoarthritis), Squamous cell carcinoma, and Varicose veins of left lower extremity with pain.   Surgical History    Past Surgical History:  Procedure Laterality Date  . ABDOMINAL HYSTERECTOMY    . APPENDECTOMY    . BLADDER REPAIR    . CATARACT EXTRACTION  05/2004    dr Ishmael Holter  . LUMBAR LAMINECTOMY/DECOMPRESSION MICRODISCECTOMY N/A 07/26/2016   Procedure: Central decompression, lumbar laminectomy L4-L5 and L5-S1, foraminotmy L5-S1 and L4-L5 bilateral;  Surgeon: Latanya Maudlin, MD;  Location: [facility];  Service: Orthopedics;  Laterality: N/A;  . NEPHRECTOMY  1970's   Rt. "Shrivelled up" ,  no cancer.   Marland Kitchen POLYPECTOMY       Social History   reports that she has never smoked. She has never used smokeless tobacco. She reports that she does not drink alcohol or use drugs.   Family History   Her family history includes Breast cancer in her sister; Colon cancer in her brother; Coronary artery disease in her father; Diabetes in her daughter; Leukemia in her brother; Ovarian cancer in her sister.   Allergies Allergies  Allergen Reactions  . Tape Other (See Comments)    SKIN IS VERY THIN, SO PLEASE USE PAPER TAPE ONLY!!  . Tilapia [Fish Allergy] Rash and Other (See Comments)    Facial rash     Home Medications  Prior to Admission medications   Medication Sig Start Date End Date Taking? Authorizing Provider  acetaminophen (TYLENOL) 650 MG CR tablet Take 650 mg by mouth 2 (two) times daily.   Yes [provider]  amLODipine (NORVASC) 2.5 MG tablet Take 1 tablet (2.5 mg total) by mouth daily. 11/22/18  Yes Eugenie Filler, MD  aspirin EC 81 MG tablet Take 81 mg by mouth daily after breakfast.    Yes [provider]  cephALEXin (KEFLEX) 500 MG capsule  Take 500 mg by mouth 2 (two) times daily. 02/13/19 02/17/19 Yes [provider]  Cholecalciferol (VITAMIN D3) 50 MCG (2000 UT) capsule Take 2,000 Units by mouth daily with breakfast.    Yes [provider]  docusate sodium (COLACE) 100 MG capsule Take 100 mg by mouth at bedtime.   Yes [provider]  donepezil (ARICEPT) 10 MG tablet Take 10 mg by mouth at bedtime.  09/25/14  Yes [provider]  feeding supplement, ENSURE ENLIVE, (ENSURE ENLIVE) LIQD Take 237 mLs by mouth 2 (two) times daily between meals. 08/10/18  Yes Sheikh, Omair Latif, DO  memantine (NAMENDA) 10 MG tablet Take 10 mg by mouth 2 (two) times daily. 04/20/16  Yes [provider]  Misc Natural Products (OSTEO BI-FLEX ADV JOINT SHIELD) TABS Take 1 tablet by mouth daily with breakfast.   Yes [provider]  Multiple Vitamin (MULITIVITAMIN WITH MINERALS) TABS Take 1 tablet by mouth daily with breakfast.    Yes [provider]  nitrofurantoin (MACRODANTIN) 100 MG capsule Take 100 mg by mouth at bedtime. 01/13/19  Yes [provider]  Omega-3 Fatty Acids (FISH OIL) 1000 MG CAPS Take 1,000 mg by mouth daily with breakfast.    Yes [provider]  oxybutynin (DITROPAN) 5 MG tablet Take 5 mg by mouth 2 (two) times daily. 09/08/14  Yes [provider]  polyethylene glycol powder (GLYCOLAX/MIRALAX) 17 GM/SCOOP powder Take 17 g by mouth daily after breakfast. MIX AND DRINK   Yes [provider]  Red Yeast Rice 600 MG TABS Take 600 mg by mouth daily with breakfast.    Yes [provider]  venlafaxine XR (EFFEXOR-XR) 75 MG 24 hr capsule Take 75 mg by mouth daily with breakfast.   Yes [provider]  vitamin B-12 (CYANOCOBALAMIN) 1000 MCG tablet Take 1,000 mcg by mouth daily with breakfast.    Yes [provider]  vitamin C (ASCORBIC ACID) 500 MG tablet Take 500 mg by mouth daily with breakfast.    Yes [provider]   VITAMIN E PO Take 1 capsule by mouth daily with breakfast.    Yes [provider]  Maltodextrin-Xanthan Gum (RESOURCE THICKENUP CLEAR) POWD 1 scoop  For 4 oz liquid, 2 scoops for eight oz and add liquids. Patient not taking: Reported on 02/16/2019 11/21/18   Eugenie Filler, MD  trimethoprim (TRIMPEX) 100 MG tablet Take 1 tablet (100 mg total) by mouth daily. Patient not taking: Reported on 02/16/2019 11/24/18   Eugenie Filler, MD     Critical care time: The patient is critically ill with multiple organ systems failure and requires high complexity decision making for assessment and support, frequent evaluation and titration of therapies, application of advanced monitoring technologies and extensive interpretation of multiple databases.   Critical Care Time devoted to patient care services described in this note is 20 Minutes. This time reflects time of care of this signee. This critical care time does not reflect procedure time, or teaching time or supervisory time of PA/NP/Med student/Med Resident etc but could involve care discussion time.  Jacques Earthly, M.D. [facility] Pulmonary/Critical Care Medicine After hours pager: [phone].

## 2019-02-17 ENCOUNTER — Inpatient Hospital Stay (HOSPITAL_COMMUNITY): Payer: Medicare HMO

## 2019-02-17 ENCOUNTER — Encounter (HOSPITAL_COMMUNITY)
Admission: EM | Disposition: A | Discharge status: Skilled Nursing Facility | Payer: Self-pay | Source: Home / Self Care | Attending: Internal Medicine

## 2019-02-17 DIAGNOSIS — I959 Hypotension, unspecified: Secondary | ICD-10-CM

## 2019-02-17 DIAGNOSIS — Z950 Presence of cardiac pacemaker: Secondary | ICD-10-CM

## 2019-02-17 DIAGNOSIS — E875 Hyperkalemia: Secondary | ICD-10-CM

## 2019-02-17 DIAGNOSIS — I442 Atrioventricular block, complete: Principal | ICD-10-CM

## 2019-02-17 DIAGNOSIS — R9431 Abnormal electrocardiogram [ECG] [EKG]: Secondary | ICD-10-CM

## 2019-02-17 DIAGNOSIS — N390 Urinary tract infection, site not specified: Secondary | ICD-10-CM

## 2019-02-17 HISTORY — DX: Presence of cardiac pacemaker: Z95.0

## 2019-02-17 HISTORY — PX: PACEMAKER IMPLANT: EP1218

## 2019-02-17 LAB — CBC
HCT: 41.8 % (ref 36.0–46.0)
Hemoglobin: 14.3 g/dL (ref 12.0–15.0)
MCH: 30.3 pg (ref 26.0–34.0)
MCHC: 34.2 g/dL (ref 30.0–36.0)
MCV: 88.6 fL (ref 80.0–100.0)
Platelets: 245 10*3/uL (ref 150–400)
RBC: 4.72 MIL/uL (ref 3.87–5.11)
RDW: 13.6 % (ref 11.5–15.5)
WBC: 10.4 10*3/uL (ref 4.0–10.5)
nRBC: 0 % (ref 0.0–0.2)

## 2019-02-17 LAB — MRSA PCR SCREENING: MRSA by PCR: NEGATIVE

## 2019-02-17 LAB — COMPREHENSIVE METABOLIC PANEL
ALT: 42 U/L (ref 0–44)
AST: 47 U/L — ABNORMAL HIGH (ref 15–41)
Albumin: 3.6 g/dL (ref 3.5–5.0)
Alkaline Phosphatase: 163 U/L — ABNORMAL HIGH (ref 38–126)
Anion gap: 11 (ref 5–15)
BUN: 24 mg/dL — ABNORMAL HIGH (ref 8–23)
CO2: 19 mmol/L — ABNORMAL LOW (ref 22–32)
Calcium: 10.1 mg/dL (ref 8.9–10.3)
Chloride: 108 mmol/L (ref 98–111)
Creatinine, Ser: 1.18 mg/dL — ABNORMAL HIGH (ref 0.44–1.00)
GFR calc Af Amer: 49 mL/min — ABNORMAL LOW (ref >60)
GFR calc non Af Amer: 42 mL/min — ABNORMAL LOW (ref >60)
Glucose, Bld: 110 mg/dL — ABNORMAL HIGH (ref 70–99)
Potassium: 4.2 mmol/L (ref 3.5–5.1)
Sodium: 138 mmol/L (ref 135–145)
Total Bilirubin: 1.2 mg/dL (ref 0.3–1.2)
Total Protein: 6.2 g/dL — ABNORMAL LOW (ref 6.5–8.1)

## 2019-02-17 LAB — HEPATIC FUNCTION PANEL
ALT: 46 U/L — ABNORMAL HIGH (ref 0–44)
AST: 38 U/L (ref 15–41)
Albumin: 3.9 g/dL (ref 3.5–5.0)
Alkaline Phosphatase: 170 U/L — ABNORMAL HIGH (ref 38–126)
Bilirubin, Direct: 0.1 mg/dL (ref 0.0–0.2)
Indirect Bilirubin: 0.6 mg/dL (ref 0.3–0.9)
Total Bilirubin: 0.7 mg/dL (ref 0.3–1.2)
Total Protein: 7.2 g/dL (ref 6.5–8.1)

## 2019-02-17 LAB — GLUCOSE, CAPILLARY: Glucose-Capillary: 102 mg/dL — ABNORMAL HIGH (ref 70–99)

## 2019-02-17 LAB — ECHOCARDIOGRAM COMPLETE
Height: 64 in
Weight: 2151.69 oz

## 2019-02-17 LAB — TROPONIN I (HIGH SENSITIVITY): Troponin I (High Sensitivity): 30 ng/L — ABNORMAL HIGH (ref <18)

## 2019-02-17 LAB — SARS CORONAVIRUS 2 (TAT 6-24 HRS): SARS Coronavirus 2: NEGATIVE

## 2019-02-17 LAB — SURGICAL PCR SCREEN
MRSA, PCR: NEGATIVE
Staphylococcus aureus: NEGATIVE

## 2019-02-17 LAB — TSH: TSH: 3.419 u[IU]/mL (ref 0.350–4.500)

## 2019-02-17 SURGERY — PACEMAKER IMPLANT

## 2019-02-17 MED ORDER — SODIUM CHLORIDE 0.9 % IV SOLN
INTRAVENOUS | Status: DC
  Administered 2019-02-17: 15:00:00 via INTRAVENOUS

## 2019-02-17 MED ORDER — ACETAMINOPHEN 325 MG PO TABS: 325.0000 mg | ORAL_TABLET | ORAL | Status: DC | PRN

## 2019-02-17 MED ORDER — CHLORHEXIDINE GLUCONATE CLOTH 2 % EX PADS
6.0000 | MEDICATED_PAD | Freq: Every day | CUTANEOUS | Status: DC
  Administered 2019-02-17 – 2019-02-25 (×9): 6 via TOPICAL

## 2019-02-17 MED ORDER — CEFAZOLIN SODIUM-DEXTROSE 1-4 GM/50ML-% IV SOLN
1.0000 g | Freq: Four times a day (QID) | INTRAVENOUS | Status: AC
  Administered 2019-02-17 – 2019-02-18 (×3): 1 g via INTRAVENOUS
  Filled 2019-02-17 (×3): qty 50

## 2019-02-17 MED ORDER — HEPARIN (PORCINE) IN NACL 1000-0.9 UT/500ML-% IV SOLN
INTRAVENOUS | Status: DC | PRN
  Administered 2019-02-17: 500 mL

## 2019-02-17 MED ORDER — DOCUSATE SODIUM 100 MG PO CAPS
100.0000 mg | ORAL_CAPSULE | Freq: Every day | ORAL | Status: DC
  Administered 2019-02-20 – 2019-02-24 (×3): 100 mg via ORAL
  Filled 2019-02-17 (×4): qty 1

## 2019-02-17 MED ORDER — CEFAZOLIN SODIUM-DEXTROSE 2-4 GM/100ML-% IV SOLN
INTRAVENOUS | Status: AC
  Filled 2019-02-17: qty 100

## 2019-02-17 MED ORDER — SODIUM CHLORIDE 0.9 % IV SOLN
80.0000 mg | INTRAVENOUS | Status: AC
  Administered 2019-02-17: 18:00:00 80 mg
  Filled 2019-02-17: qty 2

## 2019-02-17 MED ORDER — ASPIRIN EC 81 MG PO TBEC
81.0000 mg | DELAYED_RELEASE_TABLET | Freq: Every day | ORAL | Status: DC
  Administered 2019-02-20 – 2019-02-25 (×6): 81 mg via ORAL
  Filled 2019-02-17 (×7): qty 1

## 2019-02-17 MED ORDER — POLYETHYLENE GLYCOL 3350 17 G PO PACK
17.0000 g | PACK | Freq: Every day | ORAL | Status: DC
  Administered 2019-02-21 – 2019-02-25 (×5): 17 g via ORAL
  Filled 2019-02-17 (×7): qty 1

## 2019-02-17 MED ORDER — LIDOCAINE HCL (PF) 1 % IJ SOLN
INTRAMUSCULAR | Status: DC | PRN
  Administered 2019-02-17: 50 mL

## 2019-02-17 MED ORDER — AMLODIPINE BESYLATE 5 MG PO TABS
2.5000 mg | ORAL_TABLET | Freq: Every day | ORAL | Status: DC
  Administered 2019-02-17: 2.5 mg via ORAL
  Filled 2019-02-17: qty 1

## 2019-02-17 MED ORDER — SODIUM CHLORIDE 0.9 % IV SOLN
INTRAVENOUS | Status: AC
  Filled 2019-02-17: qty 2

## 2019-02-17 MED ORDER — ONDANSETRON HCL 4 MG/2ML IJ SOLN: 4.0000 mg | Freq: Four times a day (QID) | INTRAMUSCULAR | Status: DC | PRN

## 2019-02-17 MED ORDER — CEFAZOLIN SODIUM-DEXTROSE 2-4 GM/100ML-% IV SOLN
2.0000 g | INTRAVENOUS | Status: AC
  Administered 2019-02-17: 2 g via INTRAVENOUS
  Filled 2019-02-17: qty 100

## 2019-02-17 MED ORDER — LIDOCAINE HCL (PF) 1 % IJ SOLN
INTRAMUSCULAR | Status: AC
  Filled 2019-02-17: qty 60

## 2019-02-17 MED ORDER — HYDRALAZINE HCL 20 MG/ML IJ SOLN
INTRAMUSCULAR | Status: DC | PRN
  Administered 2019-02-17: 10 mg via INTRAVENOUS

## 2019-02-17 MED ORDER — HYDRALAZINE HCL 20 MG/ML IJ SOLN
INTRAMUSCULAR | Status: AC
  Filled 2019-02-17: qty 1

## 2019-02-17 MED ORDER — HEPARIN (PORCINE) IN NACL 1000-0.9 UT/500ML-% IV SOLN
INTRAVENOUS | Status: AC
  Filled 2019-02-17: qty 500

## 2019-02-17 SURGICAL SUPPLY — 8 items
CABLE SURGICAL S-101-97-12 (CABLE) ×3 IMPLANT
IPG PACE AZUR XT DR MRI W1DR01 (Pacemaker) ×1 IMPLANT
LEAD CAPSURE NOVUS 5076-52CM (Lead) ×3 IMPLANT
LEAD CAPSURE NOVUS 5076-58CM (Lead) ×3 IMPLANT
PACE AZURE XT DR MRI W1DR01 (Pacemaker) ×3 IMPLANT
PAD PRO RADIOLUCENT 2001M-C (PAD) ×3 IMPLANT
SHEATH 7FR PRELUDE SNAP 13 (SHEATH) ×4 IMPLANT
TRAY PACEMAKER INSERTION (PACKS) ×3 IMPLANT

## 2019-02-17 NOTE — H&P (Signed)
Shannon Blair has presented today for surgery, with the diagnosis of complete heart block.  The various methods of treatment have been discussed with the patient and family. After consideration of risks, benefits and other options for treatment, the patient has consented to  Procedure(s): Pacemaker implant as a surgical intervention .  Risks include but not limited to bleeding, tamponade, infection, pneumothorax, among others. The patient's history has been reviewed, patient examined, no change in status, stable for surgery.  I have reviewed the patient's chart and labs.  Questions were answered to the patient's satisfaction.    Shannon Blair Curt Bears, MD 02/17/2019 12:41 PM

## 2019-02-17 NOTE — Progress Notes (Signed)
Patient calm and cooperative all morning, pleasantly confused. Around 1200, patient increasingly agitated and attempting to pull at lines/tubes and trying to get out of the bed. Patient not easily redirected. This RN as well as others attempted to deescalate patient without success. Order for 1:1 safety sitter placed.

## 2019-02-17 NOTE — Progress Notes (Signed)
  Echocardiogram 2D Echocardiogram has been performed.  Burnett Kanaris 02/17/2019, 9:27 AM

## 2019-02-17 NOTE — Consult Note (Addendum)
ELECTROPHYSIOLOGY CONSULT NOTE    Patient ID: Shannon Blair MRN: [id number], DOB/AGE: 11-23-1931 85 y.o.  Admit date: 02/16/2019 Date of Consult: 02/17/2019  Primary Physician: Merrilee Seashore, MD Primary Cardiologist: No primary care provider on file.  Electrophysiologist: New   Referring Provider: Dr. Radford Pax  Patient Profile: Shannon Blair is a 85 y.o. female with a history of dementia and HTN who is being seen today for the evaluation of complete heart block at the request of Dr. Radford Pax.  HPI:  Shannon Blair is a 85 y.o. female with history as above admitted for confusion, worse than her baseline dementia. EKG showed CHB with escape at 38 bpm. Also noted to be hypertensive.  No AV slowing meds on board.   She is alert to person. She denies any current discomfort, chest pain, palpitations, dyspnea, PND, orthopnea, nausea, vomiting, dizziness, syncope, edema, weight gain, or early satiety.  She is not sure why she is here, or where she is.   Past Medical History:  Diagnosis Date  . Anal cancer ([place]) 2004   Non-surgical mgt with chemo-radiation.  Dr Alen Blew follows.   . Anal fissure   . Colon, diverticulosis    Sigmoid. With associated stenosis.  Colonoscopy failed to intubate beyond stenosis in 08/2010.  rectal bx then negative for recurrent cancer.   . Dementia ([place])   . Hypertension   . OA (osteoarthritis)   . Squamous cell carcinoma    skin  . Varicose veins of left lower extremity with pain      Surgical History:  Past Surgical History:  Procedure Laterality Date  . ABDOMINAL HYSTERECTOMY    . APPENDECTOMY    . BLADDER REPAIR    . CATARACT EXTRACTION  05/2004    dr Ishmael Holter  . LUMBAR LAMINECTOMY/DECOMPRESSION MICRODISCECTOMY N/A 07/26/2016   Procedure: Central decompression, lumbar laminectomy L4-L5 and L5-S1, foraminotmy L5-S1 and L4-L5 bilateral;  Surgeon: Latanya Maudlin, MD;  Location: [facility];  Service: Orthopedics;  Laterality: N/A;  . NEPHRECTOMY   1970's   Rt. "Shrivelled up" ,  no cancer.   Marland Kitchen POLYPECTOMY       Medications Prior to Admission  Medication Sig Dispense Refill Last Dose  . acetaminophen (TYLENOL) 650 MG CR tablet Take 650 mg by mouth 2 (two) times daily.   02/16/2019 at am  . amLODipine (NORVASC) 2.5 MG tablet Take 1 tablet (2.5 mg total) by mouth daily. 30 tablet 1 02/16/2019 at 1200  . aspirin EC 81 MG tablet Take 81 mg by mouth daily after breakfast.    02/15/2019 at 0800  . cephALEXin (KEFLEX) 500 MG capsule Take 500 mg by mouth 2 (two) times daily.   02/16/2019 at am  . Cholecalciferol (VITAMIN D3) 50 MCG (2000 UT) capsule Take 2,000 Units by mouth daily with breakfast.    02/15/2019 at am  . docusate sodium (COLACE) 100 MG capsule Take 100 mg by mouth at bedtime.   02/15/2019 at pm  . donepezil (ARICEPT) 10 MG tablet Take 10 mg by mouth at bedtime.    02/15/2019 at pm  . feeding supplement, ENSURE ENLIVE, (ENSURE ENLIVE) LIQD Take 237 mLs by mouth 2 (two) times daily between meals. 237 mL 12 02/15/2019 at Unknown time  . memantine (NAMENDA) 10 MG tablet Take 10 mg by mouth 2 (two) times daily.   02/16/2019 at Unknown time  . Misc Natural Products (OSTEO BI-FLEX ADV JOINT SHIELD) TABS Take 1 tablet by mouth daily with breakfast.   02/15/2019 at am  .  Multiple Vitamin (MULITIVITAMIN WITH MINERALS) TABS Take 1 tablet by mouth daily with breakfast.    02/15/2019 at am  . nitrofurantoin (MACRODANTIN) 100 MG capsule Take 100 mg by mouth at bedtime.   02/15/2019 at pm  . Omega-3 Fatty Acids (FISH OIL) 1000 MG CAPS Take 1,000 mg by mouth daily with breakfast.    02/15/2019 at Unknown time  . oxybutynin (DITROPAN) 5 MG tablet Take 5 mg by mouth 2 (two) times daily.   02/16/2019 at Unknown time  . polyethylene glycol powder (GLYCOLAX/MIRALAX) 17 GM/SCOOP powder Take 17 g by mouth daily after breakfast. MIX AND DRINK   02/15/2019 at am  . Red Yeast Blair 600 MG TABS Take 600 mg by mouth daily with breakfast.    Past Week at am  .  venlafaxine XR (EFFEXOR-XR) 75 MG 24 hr capsule Take 75 mg by mouth daily with breakfast.   02/16/2019 at Unknown time  . vitamin B-12 (CYANOCOBALAMIN) 1000 MCG tablet Take 1,000 mcg by mouth daily with breakfast.    02/15/2019 at am  . vitamin C (ASCORBIC ACID) 500 MG tablet Take 500 mg by mouth daily with breakfast.    02/15/2019 at am  . VITAMIN E PO Take 1 capsule by mouth daily with breakfast.    02/15/2019 at am  . Maltodextrin-Xanthan Gum (RESOURCE THICKENUP CLEAR) POWD 1 scoop  For 4 oz liquid, 2 scoops for eight oz and add liquids. (Patient not taking: Reported on 02/16/2019) 250 g 0 Not Taking at Unknown time  . trimethoprim (TRIMPEX) 100 MG tablet Take 1 tablet (100 mg total) by mouth daily. (Patient not taking: Reported on 02/16/2019)   Not Taking at Unknown time    Inpatient Medications:  . amLODipine  2.5 mg Oral Daily  . aspirin EC  81 mg Oral QPC breakfast  . Chlorhexidine Gluconate Cloth  6 each Topical Daily  . docusate sodium  100 mg Oral QHS  . enoxaparin (LOVENOX) injection  30 mg Subcutaneous QHS  . polyethylene glycol  17 g Oral QPC breakfast  . sodium bicarbonate  50 mEq Intravenous Once  . sodium polystyrene  15 g Oral Once    Allergies:  Allergies  Allergen Reactions  . Tape Other (See Comments)    SKIN IS VERY THIN, SO PLEASE USE PAPER TAPE ONLY!!  . Tilapia [Fish Allergy] Rash and Other (See Comments)    Facial rash    Social History   Socioeconomic History  . Marital status: Widowed    Spouse name: Not on file  . Number of children: 2  . Years of education: Not on file  . Highest education level: Not on file  Occupational History  . Occupation: RETIRED    Employer: RETIRED  Social Needs  . Financial resource strain: Not on file  . Food insecurity    Worry: Not on file    Inability: Not on file  . Transportation needs    Medical: Not on file    Non-medical: Not on file  Tobacco Use  . Smoking status: Never Smoker  . Smokeless tobacco: Never  Used  Substance and Sexual Activity  . Alcohol use: No  . Drug use: No  . Sexual activity: Not Currently  Lifestyle  . Physical activity    Days per week: Not on file    Minutes per session: Not on file  . Stress: Not on file  Relationships  . Social connections    Talks on phone: Not on file  Gets together: Not on file    Attends religious service: Not on file    Active member of club or organization: Not on file    Attends meetings of clubs or organizations: Not on file    Relationship status: Not on file  . Intimate partner violence    Fear of current or ex partner: Not on file    Emotionally abused: Not on file    Physically abused: Not on file    Forced sexual activity: Not on file  Other Topics Concern  . Not on file  Social History Narrative  . Not on file     Family History  Problem Relation Age of Onset  . Coronary artery disease Father   . Leukemia Brother   . Colon cancer Brother   . Breast cancer Sister   . Ovarian cancer Sister   . Diabetes Daughter      Review of Systems: All other systems reviewed and are otherwise negative except as noted above.  Physical Exam: Vitals:   02/17/19 0500 02/17/19 0600 02/17/19 0630 02/17/19 0700  BP: (!) 175/45 (!) 200/42 (!) 190/47 (!) 186/43  Pulse: (!) 39 (!) 39 (!) 39 (!) 39  Resp: 17 18 20  (!) 21  Temp:      TempSrc:      SpO2: 98% 97% 96% 96%  Weight:      Height:        GEN- The patient is elderly and chronically ill appearing, alert and oriented person only today.   HEENT: normocephalic, atraumatic; sclera clear, conjunctiva pink; hearing intact; oropharynx clear; neck supple Lungs- Clear to ausculation bilaterally, normal work of breathing.  No wheezes, rales, rhonchi Heart- Regular, slow. No murmurs, rubs or gallops appreciated. GI- soft, non-tender, non-distended, bowel sounds present Extremities- no clubbing, cyanosis, or edema; DP/PT/radial pulses 2+ bilaterally MS- no significant deformity or  atrophy Skin- warm and dry, no rash or lesion Psych- euthymic mood, full affect Neuro- strength and sensation are intact  Labs:   Lab Results  Component Value Date   WBC 10.4 02/17/2019   HGB 14.3 02/17/2019   HCT 41.8 02/17/2019   MCV 88.6 02/17/2019   PLT 245 02/17/2019    Recent Labs  Lab 02/17/19 0327  NA 138  K 4.2  CL 108  CO2 19*  BUN 24*  CREATININE 1.18*  CALCIUM 10.1  PROT 6.2*  BILITOT 1.2  ALKPHOS 163*  ALT 42  AST 47*  GLUCOSE 110*      Radiology/Studies: No results found.  EKG: 08-Mar-2019 shows CHB with junctional escape at 39 bpm (personally reviewed)  TELEMETRY: CHB with escape in 30-40s (personally reviewed)  Assessment/Plan: 1.  Complete Heart Block Without reversible cause identified TSH normal Echo pending She is hemodynamically stable, and NPO. Discussed risks and benefits with POA, Shannon Blair, at length. She is most concerned that patient would be unable to follow arm restrictions. She would like to think on it for a couple of hours and be called back.   2. HTN Per primary. Avoid AVN blocking agents  3. UTI Per TRH  4. Dementia Per TRH. As above,  discuss with POA if felt reasonable to proceed with PPM.  She is only alert to person today. Doesn't know where she is, or why she is here.   5. CKD III Baseline Cr appears to be 1.3 - 1.5, though she had episode of ARF earlier this year with Cr up into 3.0 range.  For questions or updates,  please contact [place] Please consult [email or web address] for contact info under Cardiology/STEMI.  Signed, Shirley Friar, PA-C  02/17/2019 7:50 AM   I have seen and examined this patient with Shannon Blair.  Agree with above, note added to reflect my findings.  On exam, bradycardic, no murmurs, lungs clear. Patient admitted with complete AV block. Has dementia and  discuss pacemaker with family. No reversible causes found for heart block.      M.  MD 02/17/2019  10:00 AM

## 2019-02-17 NOTE — Progress Notes (Addendum)
Marland Kitchen  PROGRESS NOTE    Shannon Blair  [id number] DOB: June 15, 1931 DOA: 02/16/2019 PCP: Merrilee Seashore, MD   Brief Narrative:   Shannon Blair  is a 85 y.o. female,  w dementia, hypertension, hx of recurrent uti, apparently presents with generalized weakness, and thought to have UTI.  Pt is unable to provide meaningful history due to dementia.   11/30: Pleasantly confused ON. EP has seen and is working with family on placement of pacer.    Assessment & Plan:   Active Problems:   Complete heart block (HCC)  Complete heart block     - Cards/EP have seen; plan for pacer     - lyme titer pending     - echo pending  Acute lower uti Recurrent UTI     - Blood culture x2     - UCx ordered? Already received abx.     - continue Rocephin 1 g qday     - Avoid macrobid, can affect conduction      - afebrile, WBC ok  Hypertension uncontrolled     - Cont Amlodipine 2.5mg  po qday     - Hydralazine 5 mg IV q6h prn; goal SBP for now is 180-190  Dementia     - STOP Aricept     - STOP Namenda -> can induce bradycardia     - STOP Effexor -> can worsen hypertension     - 1:1 sitter  Urinary incontinence     - STOP oxybutynin; can affect conduction     - monitor  Hyperkalemia     - resolved  DVT prophylaxis: lovenox Code Status: DNR   Disposition Plan: TBD  Consultants:   Cardiology  PCCM  EP  Palliative Care  Antimicrobials:  . Rocephin   Subjective: "I think I'm ok."  Objective: Vitals:   02/17/19 1100 02/17/19 1135 02/17/19 1200 02/17/19 1300  BP: (!) 212/49 (!) 202/49 (!) 177/39 (!) 136/95  Pulse: (!) 41  (!) 39 (!) 39  Resp: 13  20 17   Temp: 97.9 F (36.6 C)     TempSrc: Oral     SpO2: 97%  99% 98%  Weight:      Height:        Intake/Output Summary (Last 24 hours) at 02/17/2019 1451 Last data filed at 02/17/2019 1300 Gross per 24 hour  Intake 1640.25 ml  Output 100 ml  Net 1540.25 ml   Filed Weights   02/16/19 1538 02/17/19 0352   Weight: 63 kg 61 kg    Examination:  General: 85 y.o. female resting in bed in NAD Cardiovascular: brady, +S1, S2, no m/g/r, equal pulses throughout Respiratory: CTABL, no w/r/r, normal WOB GI: BS+, NDNT, no masses noted, no organomegaly noted MSK: No e/c/c Neuro: alert to name, follows commands Psyc: pleasantly confused   Data Reviewed: I have personally reviewed following labs and imaging studies.  CBC: Recent Labs  Lab 02/16/19 1802 02/17/19 0327  WBC 8.4 10.4  NEUTROABS 5.5  --   HGB 13.2 14.3  HCT 38.7 41.8  MCV 89.2 88.6  PLT 222 [id number]   Basic Metabolic Panel: Recent Labs  Lab 02/16/19 1600 02/17/19 0327  NA 140 138  K 5.3* 4.2  CL 107 108  CO2 22 19*  GLUCOSE 85 110*  BUN 29* 24*  CREATININE 1.31* 1.18*  CALCIUM 10.5* 10.1   GFR: Estimated Creatinine Clearance: 30.6 mL/min (A) (by C-G formula based on SCr of 1.18 mg/dL (H)). Liver Function Tests: Recent Labs  Lab 02/17/19 0254 02/17/19 0327  AST 38 47*  ALT 46* 42  ALKPHOS 170* 163*  BILITOT 0.7 1.2  PROT 7.2 6.2*  ALBUMIN 3.9 3.6   No results for input(s): LIPASE, AMYLASE in the last 168 hours. No results for input(s): AMMONIA in the last 168 hours. Coagulation Profile: No results for input(s): INR, PROTIME in the last 168 hours. Cardiac Enzymes: No results for input(s): CKTOTAL, CKMB, CKMBINDEX, TROPONINI in the last 168 hours. BNP (last 3 results) No results for input(s): PROBNP in the last 8760 hours. HbA1C: No results for input(s): HGBA1C in the last 72 hours. CBG: Recent Labs  Lab 02/17/19 1131  GLUCAP 102*   Lipid Profile: No results for input(s): CHOL, HDL, LDLCALC, TRIG, CHOLHDL, LDLDIRECT in the last 72 hours. Thyroid Function Tests: Recent Labs    02/17/19 0254  TSH 3.419   Anemia Panel: No results for input(s): VITAMINB12, FOLATE, FERRITIN, TIBC, IRON, RETICCTPCT in the last 72 hours. Sepsis Labs: No results for input(s): PROCALCITON, LATICACIDVEN in the last 168  hours.  Recent Results (from the past 240 hour(s))  SARS CORONAVIRUS 2 (TAT 6-24 HRS) Nasopharyngeal Nasopharyngeal Swab     Status: None   Collection Time: 02/17/19 12:09 AM   Specimen: Nasopharyngeal Swab  Result Value Ref Range Status   SARS Coronavirus 2 NEGATIVE NEGATIVE Final    Comment: (NOTE) SARS-CoV-2 target nucleic acids are NOT DETECTED. The SARS-CoV-2 RNA is generally detectable in upper and lower respiratory specimens during the acute phase of infection. Negative results do not preclude SARS-CoV-2 infection, do not rule out co-infections with other pathogens, and should not be used as the sole basis for treatment or other patient management decisions. Negative results must be combined with clinical observations, patient history, and epidemiological information. The expected result is Negative. Fact Sheet for Patients: SugarRoll.be Fact Sheet for Healthcare Providers: https://www.woods-mathews.com/ This test is not yet approved or cleared by the [place] FDA and  has been authorized for detection and/or diagnosis of SARS-CoV-2 by FDA under an Emergency Use Authorization (EUA). This EUA will remain  in effect (meaning this test can be used) for the duration of the COVID-19 declaration under Section 56 4(b)(1) of the Act, 21 U.S.C. section 360bbb-3(b)(1), unless the authorization is terminated or revoked sooner. Performed at [facility], [place] [address], [place], [place] [address]   MRSA PCR Screening     Status: None   Collection Time: 02/17/19  3:15 AM   Specimen: Nasal Mucosa; Nasopharyngeal  Result Value Ref Range Status   MRSA by PCR NEGATIVE NEGATIVE Final    Comment:        The GeneXpert MRSA Assay (FDA approved for NASAL specimens only), is one component of a comprehensive MRSA colonization surveillance program. It is not intended to diagnose MRSA infection nor to guide or monitor treatment for MRSA  infections. Performed at [facility], [place] [address], [place], [place] [address]   Surgical PCR screen     Status: None   Collection Time: 02/17/19  3:15 AM   Specimen: Nasal Mucosa; Nasal Swab  Result Value Ref Range Status   MRSA, PCR NEGATIVE NEGATIVE Final   Staphylococcus aureus NEGATIVE NEGATIVE Final    Comment: (NOTE) The Xpert SA Assay (FDA approved for NASAL specimens in patients 72 years of age and older), is one component of a comprehensive surveillance program. It is not intended to diagnose infection nor to guide or monitor treatment. Performed at [facility],  [address] [place], [place] [address]       Radiology Studies: No results found.   Scheduled Meds: . amLODipine  2.5 mg Oral Daily  . aspirin EC  81 mg Oral QPC breakfast  . Chlorhexidine Gluconate Cloth  6 each Topical Daily  . docusate sodium  100 mg Oral QHS  . enoxaparin (LOVENOX) injection  30 mg Subcutaneous QHS  . gentamicin irrigation  80 mg Irrigation On Call  . polyethylene glycol  17 g Oral QPC breakfast  . sodium bicarbonate  50 mEq Intravenous Once  . sodium polystyrene  15 g Oral Once   Continuous Infusions: . sodium chloride 50 mL/hr at 02/17/19 1300  . sodium chloride    .  ceFAZolin (ANCEF) IV    . cefTRIAXone (ROCEPHIN)  IV       LOS: 1 day    Time spent: 35 minutes spent in the coordination of care today.    Jonnie Finner, DO [facility] Pager [phone]  If 7PM-7AM, please contact night-coverage [email or web address] Password [email or web address] 02/17/2019, 2:51 PM

## 2019-02-18 ENCOUNTER — Inpatient Hospital Stay (HOSPITAL_COMMUNITY): Payer: Medicare HMO

## 2019-02-18 ENCOUNTER — Encounter (HOSPITAL_COMMUNITY): Payer: Self-pay | Admitting: Cardiology

## 2019-02-18 DIAGNOSIS — Z7189 Other specified counseling: Secondary | ICD-10-CM

## 2019-02-18 DIAGNOSIS — L8990 Pressure ulcer of unspecified site, unspecified stage: Secondary | ICD-10-CM | POA: Insufficient documentation

## 2019-02-18 DIAGNOSIS — Z515 Encounter for palliative care: Secondary | ICD-10-CM

## 2019-02-18 LAB — CBC WITH DIFFERENTIAL/PLATELET
Abs Immature Granulocytes: 0.08 10*3/uL — ABNORMAL HIGH (ref 0.00–0.07)
Basophils Absolute: 0 10*3/uL (ref 0.0–0.1)
Basophils Relative: 0 %
Eosinophils Absolute: 0.1 10*3/uL (ref 0.0–0.5)
Eosinophils Relative: 1 %
HCT: 38.2 % (ref 36.0–46.0)
Hemoglobin: 12.8 g/dL (ref 12.0–15.0)
Immature Granulocytes: 1 %
Lymphocytes Relative: 7 %
Lymphs Abs: 1.1 10*3/uL (ref 0.7–4.0)
MCH: 30.2 pg (ref 26.0–34.0)
MCHC: 33.5 g/dL (ref 30.0–36.0)
MCV: 90.1 fL (ref 80.0–100.0)
Monocytes Absolute: 1 10*3/uL (ref 0.1–1.0)
Monocytes Relative: 6 %
Neutro Abs: 12.7 10*3/uL — ABNORMAL HIGH (ref 1.7–7.7)
Neutrophils Relative %: 85 %
Platelets: 185 10*3/uL (ref 150–400)
RBC: 4.24 MIL/uL (ref 3.87–5.11)
RDW: 14 % (ref 11.5–15.5)
WBC: 15 10*3/uL — ABNORMAL HIGH (ref 4.0–10.5)
nRBC: 0 % (ref 0.0–0.2)

## 2019-02-18 LAB — POCT I-STAT 7, (LYTES, BLD GAS, ICA,H+H)
Acid-base deficit: 3 mmol/L — ABNORMAL HIGH (ref 0.0–2.0)
Acid-base deficit: 4 mmol/L — ABNORMAL HIGH (ref 0.0–2.0)
Bicarbonate: 18.5 mmol/L — ABNORMAL LOW (ref 20.0–28.0)
Bicarbonate: 17.7 mmol/L — ABNORMAL LOW (ref 20.0–28.0)
Calcium, Ion: 1.38 mmol/L (ref 1.15–1.40)
Calcium, Ion: 1.37 mmol/L (ref 1.15–1.40)
HCT: 33 % — ABNORMAL LOW (ref 36.0–46.0)
HCT: 34 % — ABNORMAL LOW (ref 36.0–46.0)
Hemoglobin: 11.2 g/dL — ABNORMAL LOW (ref 12.0–15.0)
Hemoglobin: 11.6 g/dL — ABNORMAL LOW (ref 12.0–15.0)
O2 Saturation: 93 %
O2 Saturation: 97 %
Patient temperature: 98.3
Patient temperature: 98.6
Potassium: 3.9 mmol/L (ref 3.5–5.1)
Potassium: 3.8 mmol/L (ref 3.5–5.1)
Sodium: 140 mmol/L (ref 135–145)
Sodium: 139 mmol/L (ref 135–145)
TCO2: 19 mmol/L — ABNORMAL LOW (ref 22–32)
TCO2: 18 mmol/L — ABNORMAL LOW (ref 22–32)
pCO2 arterial: 23.9 mmHg — ABNORMAL LOW (ref 32.0–48.0)
pCO2 arterial: 22.8 mmHg — ABNORMAL LOW (ref 32.0–48.0)
pH, Arterial: 7.496 — ABNORMAL HIGH (ref 7.350–7.450)
pH, Arterial: 7.497 — ABNORMAL HIGH (ref 7.350–7.450)
pO2, Arterial: 60 mmHg — ABNORMAL LOW (ref 83.0–108.0)
pO2, Arterial: 76 mmHg — ABNORMAL LOW (ref 83.0–108.0)

## 2019-02-18 LAB — AMMONIA: Ammonia: 13 umol/L (ref 9–35)

## 2019-02-18 LAB — RENAL FUNCTION PANEL
Albumin: 3.5 g/dL (ref 3.5–5.0)
Anion gap: 14 (ref 5–15)
BUN: 26 mg/dL — ABNORMAL HIGH (ref 8–23)
CO2: 18 mmol/L — ABNORMAL LOW (ref 22–32)
Calcium: 10 mg/dL (ref 8.9–10.3)
Chloride: 110 mmol/L (ref 98–111)
Creatinine, Ser: 1.34 mg/dL — ABNORMAL HIGH (ref 0.44–1.00)
GFR calc Af Amer: 42 mL/min — ABNORMAL LOW (ref >60)
GFR calc non Af Amer: 36 mL/min — ABNORMAL LOW (ref >60)
Glucose, Bld: 122 mg/dL — ABNORMAL HIGH (ref 70–99)
Phosphorus: 3.4 mg/dL (ref 2.5–4.6)
Potassium: 4.2 mmol/L (ref 3.5–5.1)
Sodium: 142 mmol/L (ref 135–145)

## 2019-02-18 LAB — HEPATIC FUNCTION PANEL
ALT: 24 U/L (ref 0–44)
AST: 23 U/L (ref 15–41)
Albumin: 3.3 g/dL — ABNORMAL LOW (ref 3.5–5.0)
Alkaline Phosphatase: 113 U/L (ref 38–126)
Bilirubin, Direct: 0.2 mg/dL (ref 0.0–0.2)
Indirect Bilirubin: 0.4 mg/dL (ref 0.3–0.9)
Total Bilirubin: 0.6 mg/dL (ref 0.3–1.2)
Total Protein: 6 g/dL — ABNORMAL LOW (ref 6.5–8.1)

## 2019-02-18 LAB — MAGNESIUM: Magnesium: 2 mg/dL (ref 1.7–2.4)

## 2019-02-18 LAB — B. BURGDORFI ANTIBODIES: B burgdorferi Ab IgG+IgM: <0.91 {ISR} (ref 0.00–0.90)

## 2019-02-18 MED ORDER — METOPROLOL TARTRATE 5 MG/5ML IV SOLN
2.5000 mg | Freq: Four times a day (QID) | INTRAVENOUS | Status: DC
  Administered 2019-02-18 – 2019-02-19 (×3): 2.5 mg via INTRAVENOUS
  Filled 2019-02-18 (×3): qty 5

## 2019-02-18 MED ORDER — IOHEXOL 300 MG/ML  SOLN
100.0000 mL | Freq: Once | INTRAMUSCULAR | Status: AC | PRN
  Administered 2019-02-18: 100 mL via INTRAVENOUS

## 2019-02-18 MED ORDER — HYDRALAZINE HCL 20 MG/ML IJ SOLN
20.0000 mg | Freq: Four times a day (QID) | INTRAMUSCULAR | Status: DC | PRN
  Administered 2019-02-18 – 2019-02-23 (×7): 20 mg via INTRAVENOUS
  Filled 2019-02-18 (×7): qty 1

## 2019-02-18 MED ORDER — AMLODIPINE BESYLATE 5 MG PO TABS: 2.5000 mg | ORAL_TABLET | Freq: Once | ORAL | Status: DC

## 2019-02-18 MED ORDER — AMLODIPINE BESYLATE 5 MG PO TABS
5.0000 mg | ORAL_TABLET | Freq: Every day | ORAL | Status: DC
  Administered 2019-02-20 – 2019-02-22 (×3): 5 mg via ORAL
  Filled 2019-02-18 (×3): qty 1

## 2019-02-18 NOTE — Progress Notes (Addendum)
Electrophysiology Rounding Note  Patient Name: Shannon Blair Date of Encounter: 02/18/2019  Electrophysiologist: Dr. Curt Bears  Subjective   S/p Dual Chamber MDT [facility] 02/17/2019  The patient is sleeping initially this am, and in no distress.    CXR stable with no pneumothorax   Inpatient Medications    Scheduled Meds: . amLODipine  2.5 mg Oral Daily  . aspirin EC  81 mg Oral QPC breakfast  . Chlorhexidine Gluconate Cloth  6 each Topical Daily  . docusate sodium  100 mg Oral QHS  . enoxaparin (LOVENOX) injection  30 mg Subcutaneous QHS  . polyethylene glycol  17 g Oral QPC breakfast  . sodium bicarbonate  50 mEq Intravenous Once  . sodium polystyrene  15 g Oral Once   Continuous Infusions: . sodium chloride 50 mL/hr at 02/18/19 0700  .  ceFAZolin (ANCEF) IV Stopped (02/18/19 0254)  . cefTRIAXone (ROCEPHIN)  IV     PRN Meds: acetaminophen **OR** acetaminophen, acetaminophen, hydrALAZINE, ondansetron (ZOFRAN) IV   Vital Signs    Vitals:   02/18/19 0410 02/18/19 0500 02/18/19 0600 02/18/19 0700  BP:   (!) 157/56 (!) 155/58  Pulse:   89 90  Resp:   (!) 22 (!) 22  Temp: 98.8 F (37.1 C)     TempSrc: Axillary     SpO2:   96% 96%  Weight:  61.3 kg    Height:        Intake/Output Summary (Last 24 hours) at 02/18/2019 0817 Last data filed at 02/18/2019 0700 Gross per 24 hour  Intake 1160.38 ml  Output 400 ml  Net 760.38 ml   Filed Weights   02/16/19 1538 02/17/19 0352 02/18/19 0500  Weight: 63 kg 61 kg 61.3 kg    Physical Exam    GEN- The patient is elderly appearing. Alert to person.  Head- normocephalic, atraumatic Eyes-  Sclera clear, conjunctiva pink Ears- hearing intact Oropharynx- clear Neck- supple Lungs- Clear to ausculation bilaterally, normal work of breathing Heart- Regular rate and rhythm, no murmurs, rubs or gallops. PPM site stable.  GI- soft, NT, ND, + BS Extremities- no clubbing, cyanosis, or edema Skin- no rash or lesion Psych-  euthymic mood, full affect Neuro- strength and sensation are intact  Labs    CBC Recent Labs    02/16/19 1802 02/17/19 0327 02/18/19 0218  WBC 8.4 10.4 15.0*  NEUTROABS 5.5  --  12.7*  HGB 13.2 14.3 12.8  HCT 38.7 41.8 38.2  MCV 89.2 88.6 90.1  PLT 222 245 [id number]   Basic Metabolic Panel Recent Labs    02/17/19 0327 02/18/19 0218  NA 138 142  K 4.2 4.2  CL 108 110  CO2 19* 18*  GLUCOSE 110* 122*  BUN 24* 26*  CREATININE 1.18* 1.34*  CALCIUM 10.1 10.0  MG  --  2.0  PHOS  --  3.4   Liver Function Tests Recent Labs    02/17/19 0254 02/17/19 0327 02/18/19 0218  AST 38 47*  --   ALT 46* 42  --   ALKPHOS 170* 163*  --   BILITOT 0.7 1.2  --   PROT 7.2 6.2*  --   ALBUMIN 3.9 3.6 3.5   No results for input(s): LIPASE, AMYLASE in the last 72 hours. Cardiac Enzymes No results for input(s): CKTOTAL, CKMB, CKMBINDEX, TROPONINI in the last 72 hours. BNP Invalid input(s): POCBNP D-Dimer No results for input(s): DDIMER in the last 72 hours. Hemoglobin A1C No results for input(s): HGBA1C in the  last 72 hours. Fasting Lipid Panel No results for input(s): CHOL, HDL, LDLCALC, TRIG, CHOLHDL, LDLDIRECT in the last 72 hours. Thyroid Function Tests Recent Labs    02/17/19 0254  TSH 3.419    Telemetry    V paced 80s (personally reviewed)  Radiology    Dg Chest 2 View  Result Date: 02/18/2019 CLINICAL DATA:  ICD placement. EXAM: CHEST - 2 VIEW COMPARISON:  Two-view chest x-ray 07/05/2018 FINDINGS: The heart size is exaggerate by low lung volumes. Mild pulmonary vascular congestion is present. Bibasilar airspace opacities likely reflect atelectasis. A dual-chamber pacemaker has been placed. Leads terminate in the right atrium and right ventricle. Atherosclerotic changes are noted at the aorta. IMPRESSION: 1. Interval placement of dual-chamber pacemaker without radiographic evidence for complication. 2. Low lung volumes and mild bibasilar atelectasis. 3. Mild pulmonary  vascular congestion. Electronically Signed   By: San Morelle M.D.   On: 02/18/2019 07:25     Patient Profile     Shannon Blair is a 85 y.o. female with a history of dementia and HTN who is being seen toay for the evaluation of complete heart block.  Assessment & Plan    1. CHB s/p MDT dual chamber PPM 02/17/2019 Stable device function CXR this am without pneumothorax   2. HTN Adjust meds as tolerated OK to use low dose BB now in setting of PPM, if needed  3. Dementia Per TRH.  Ac [place] review wound care and arm restrictions with family member.   Follow up made and instructions in chart. EP team to see as needed while here.   For questions or updates, please contact [place] Please consult [email or web address] for contact info under Cardiology/STEMI.  Signed, Shirley Friar, PA-C  02/18/2019, 8:17 AM   I have seen and examined this patient with Oda Kilts.  Agree with above, note added to reflect my findings.  On exam, RRR, no murmurs, lungs clear.  Patient status post pacemaker for complete heart block.  Chest x-ray and interrogation without issues.  At this point, no further work-up per electrophysiology.  We Ansleigh Safer see as needed throughout the hospitalization.  Follow-up Jenese Mischke be made in device clinic as well as in EP clinic for device care.  Keyshawn Hellwig M. Aubrina Nieman MD 02/18/2019 9:33 AM

## 2019-02-18 NOTE — Progress Notes (Signed)
Paged Shannon Ishihara MD about patient being unarousable this AM. Patient sternal rubbed, deep suctioned, etc minimal response. Pupils equal and reactive bilaterally. Responds to pain in all extremities. Of note, patient was awake most of the night. Orders for stat head CT and ammonia and hepatic function labs received.
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
## 2019-02-18 NOTE — Progress Notes (Addendum)
Marland Kitchen  PROGRESS NOTE    Shannon Blair  [id number] DOB: 1931-07-11 DOA: 02/16/2019 PCP: Merrilee Seashore, MD   Brief Narrative:   [name] a85 y.o.female,w dementia, hypertension, hx of recurrent uti, apparently presents with generalized weakness, and thought to have UTI. Pt is unable to provide meaningful history due to dementia.   11/30: Pleasantly confused ON. EP has seen and is working with family on placement of pacer.  12/1: Lethargic this AM. Deep sternal rub results in grimace but not much more. No focality on exam. CTH negative. LFTs and ammonia ok. SpO2 is mid-90's, but ABG shows hypoxia.   Updated POA at 1545hrs. Confirms DNR status. CTA PE negative. Place 4L [place] and repeat ABG pending. Add PRN metoprolol.  Assessment & Plan:   Active Problems:   Complete heart block (HCC)   Pressure injury of skin  Complete heart block     - Cards/EP have seen; plan for pacer     - echo results noted     - 12/1: now s/p dual chamber PPM; appreciate cards assistance  Acute lower uti Recurrent UTI     - Blood culture x2     - UCx ordered? Already received abx.     - continue Rocephin 1 g qday     - Avoid macrobid, can affect conduction      - 12/1: WBC up today, remains afebrile; continue abx  Hypertension uncontrolled     - Cont Amlodipine 2.5mg  po qday     - Hydralazine 5 mg IV q6h prn; goal SBP for now is 180-190     - 12/1: trying to resume PO meds, but she is lethargic today, continue hydralazine; goal is normotensive now  Dementia     - STOP Aricept     - STOP Namenda -> can induce bradycardia     - STOP Effexor -> can worsen hypertension     - 1:1 sitter  Urinary incontinence     - STOP oxybutynin; can affect conduction     - monitor  Hyperkalemia     - resolved  AMS/lethargy Hypoxia Respiratory alkalosis     - CTH negative     - LFTs/ammonia ok     - glucose ok     - ABG w/ hypoxia and hypocapnea; CTA PE negative; continue [place] oxygen  and follow ABG     - CXR w/ some pulmonary vascular congestion     - spoke with PCCM; DNR, no intubation; confirmed DNR status with POA  DVT prophylaxis: lovenox Code Status: DNR   Disposition Plan: TBD  Consultants:   Cardiology  Antimicrobials:   rocephin   Subjective: Active ON til about 4am but then became lethargic  Objective: Vitals:   02/18/19 1100 02/18/19 1200 02/18/19 1300 02/18/19 1400  BP:  (!) 168/60 (!) 174/62 (!) 172/65  Pulse: 84 88 87 89  Resp: (!) 23 20 (!) 22 (!) 23  Temp:      TempSrc:      SpO2: 95% 96% 96% 96%  Weight:      Height:        Intake/Output Summary (Last 24 hours) at 02/18/2019 1528 Last data filed at 02/18/2019 1358 Gross per 24 hour  Intake 960.43 ml  Output 1050 ml  Net -89.57 ml   Filed Weights   02/16/19 1538 02/17/19 0352 02/18/19 0500  Weight: 63 kg 61 kg 61.3 kg    Examination:  General: 85 y.o. female resting in bed in NAD  Cardiovascular: RRR, +S1, S2,1/2 SEM Respiratory: CTABL, no w/r/r, normal WOB, no stridor GI: BS+, NDNT, soft MSK: No e/c/c Neuro: somnolent, PERRL, no focality on exam  Data Reviewed: I have personally reviewed following labs and imaging studies.  CBC: Recent Labs  Lab 02/16/19 1802 02/17/19 0327 02/18/19 0218 02/18/19 1437  WBC 8.4 10.4 15.0*  --   NEUTROABS 5.5  --  12.7*  --   HGB 13.2 14.3 12.8 11.2*  HCT 38.7 41.8 38.2 33.0*  MCV 89.2 88.6 90.1  --   PLT 222 245 185  --    Basic Metabolic Panel: Recent Labs  Lab 02/16/19 1600 02/17/19 0327 02/18/19 0218 02/18/19 1437  NA 140 138 142 140  K 5.3* 4.2 4.2 3.9  CL 107 108 110  --   CO2 22 19* 18*  --   GLUCOSE 85 110* 122*  --   BUN 29* 24* 26*  --   CREATININE 1.31* 1.18* 1.34*  --   CALCIUM 10.5* 10.1 10.0  --   MG  --   --  2.0  --   PHOS  --   --  3.4  --    GFR: Estimated Creatinine Clearance: 27 mL/min (A) (by C-G formula based on SCr of 1.34 mg/dL (H)). Liver Function Tests: Recent Labs  Lab 02/17/19 0254  02/17/19 0327 02/18/19 0218 02/18/19 1000  AST 38 47*  --  23  ALT 46* 42  --  24  ALKPHOS 170* 163*  --  113  BILITOT 0.7 1.2  --  0.6  PROT 7.2 6.2*  --  6.0*  ALBUMIN 3.9 3.6 3.5 3.3*   No results for input(s): LIPASE, AMYLASE in the last 168 hours. Recent Labs  Lab 02/18/19 1000  AMMONIA 13   Coagulation Profile: No results for input(s): INR, PROTIME in the last 168 hours. Cardiac Enzymes: No results for input(s): CKTOTAL, CKMB, CKMBINDEX, TROPONINI in the last 168 hours. BNP (last 3 results) No results for input(s): PROBNP in the last 8760 hours. HbA1C: No results for input(s): HGBA1C in the last 72 hours. CBG: Recent Labs  Lab 02/17/19 1131  GLUCAP 102*   Lipid Profile: No results for input(s): CHOL, HDL, LDLCALC, TRIG, CHOLHDL, LDLDIRECT in the last 72 hours. Thyroid Function Tests: Recent Labs    02/17/19 0254  TSH 3.419   Anemia Panel: No results for input(s): VITAMINB12, FOLATE, FERRITIN, TIBC, IRON, RETICCTPCT in the last 72 hours. Sepsis Labs: No results for input(s): PROCALCITON, LATICACIDVEN in the last 168 hours.  Recent Results (from the past 240 hour(s))  SARS CORONAVIRUS 2 (TAT 6-24 HRS) Nasopharyngeal Nasopharyngeal Swab     Status: None   Collection Time: 02/17/19 12:09 AM   Specimen: Nasopharyngeal Swab  Result Value Ref Range Status   SARS Coronavirus 2 NEGATIVE NEGATIVE Final    Comment: (NOTE) SARS-CoV-2 target nucleic acids are NOT DETECTED. The SARS-CoV-2 RNA is generally detectable in upper and lower respiratory specimens during the acute phase of infection. Negative results do not preclude SARS-CoV-2 infection, do not rule out co-infections with other pathogens, and should not be used as the sole basis for treatment or other patient management decisions. Negative results must be combined with clinical observations, patient history, and epidemiological information. The expected result is Negative. Fact Sheet for  Patients: SugarRoll.be Fact Sheet for Healthcare Providers: https://www.woods-mathews.com/ This test is not yet approved or cleared by the [place] FDA and  has been authorized for detection and/or diagnosis of SARS-CoV-2 by FDA under  an Emergency Use Authorization (EUA). This EUA will remain  in effect (meaning this test can be used) for the duration of the COVID-19 declaration under Section 56 4(b)(1) of the Act, 21 U.S.C. section 360bbb-3(b)(1), unless the authorization is terminated or revoked sooner. Performed at [facility], [place] [address], [place], [place] [address]   MRSA PCR Screening     Status: None   Collection Time: 02/17/19  3:15 AM   Specimen: Nasal Mucosa; Nasopharyngeal  Result Value Ref Range Status   MRSA by PCR NEGATIVE NEGATIVE Final    Comment:        The GeneXpert MRSA Assay (FDA approved for NASAL specimens only), is one component of a comprehensive MRSA colonization surveillance program. It is not intended to diagnose MRSA infection nor to guide or monitor treatment for MRSA infections. Performed at [facility], [place] [address], [place], [place] [address]   Surgical PCR screen     Status: None   Collection Time: 02/17/19  3:15 AM   Specimen: Nasal Mucosa; Nasal Swab  Result Value Ref Range Status   MRSA, PCR NEGATIVE NEGATIVE Final   Staphylococcus aureus NEGATIVE NEGATIVE Final    Comment: (NOTE) The Xpert SA Assay (FDA approved for NASAL specimens in patients 69 years of age and older), is one component of a comprehensive surveillance program. It is not intended to diagnose infection nor to guide or monitor treatment. Performed at [facility], [place] [address] [place],  [address]       Radiology Studies: Dg Chest 2 View  Result Date: 02/18/2019 CLINICAL DATA:  ICD placement. EXAM: CHEST - 2 VIEW COMPARISON:  Two-view chest x-ray 07/05/2018 FINDINGS: The heart size is  exaggerate by low lung volumes. Mild pulmonary vascular congestion is present. Bibasilar airspace opacities likely reflect atelectasis. A dual-chamber pacemaker has been placed. Leads terminate in the right atrium and right ventricle. Atherosclerotic changes are noted at the aorta. IMPRESSION: 1. Interval placement of dual-chamber pacemaker without radiographic evidence for complication. 2. Low lung volumes and mild bibasilar atelectasis. 3. Mild pulmonary vascular congestion. Electronically Signed   By: San Morelle M.D.   On: 02/18/2019 07:25   Ct Head Wo Contrast  Result Date: 02/18/2019 CLINICAL DATA:  Altered level of consciousness (LOC), unexplained. Additional history provided: Patient became unresponsive around 8:30-9 AM. EXAM: CT HEAD WITHOUT CONTRAST TECHNIQUE: Contiguous axial images were obtained from the base of the skull through the vertex without intravenous contrast. COMPARISON:  Head CT 11/18/2018 FINDINGS: Brain: Mildly motion degraded examination. No evidence of acute intracranial hemorrhage. No demarcated cortical infarction. No evidence of intracranial mass. No midline shift or extra-axial fluid collection. Advanced patchy and confluent hypodensity within the cerebral white matter is nonspecific, but consistent with chronic small vessel ischemic disease. A tiny chronic lacunar infarct within the right basal ganglia was better appreciated on prior head CT 11/18/2018. Mild generalized parenchymal atrophy. Vascular: No hyperdense vessel.  Atherosclerotic calcifications. Skull: Normal. Negative for fracture or focal lesion. Sinuses/Orbits: Visualized orbits demonstrate no acute abnormality. Minimal scattered paranasal sinus mucosal thickening. No significant mastoid effusion. IMPRESSION: 1. No CT evidence of acute intracranial abnormality. 2. Generalized parenchymal atrophy with advanced chronic small vessel ischemic disease. Electronically Signed   By: Kellie Simmering DO   On: 02/18/2019  10:24     Scheduled Meds:  amLODipine  2.5 mg Oral Once   [START ON 02/19/2019] amLODipine  5 mg Oral Daily   aspirin EC  81 mg Oral  QPC breakfast   Chlorhexidine Gluconate Cloth  6 each Topical Daily   docusate sodium  100 mg Oral QHS   enoxaparin (LOVENOX) injection  30 mg Subcutaneous QHS   polyethylene glycol  17 g Oral QPC breakfast   sodium bicarbonate  50 mEq Intravenous Once   sodium polystyrene  15 g Oral Once   Continuous Infusions:  sodium chloride Stopped (02/18/19 0930)   cefTRIAXone (ROCEPHIN)  IV       LOS: 2 days    Time spent: 60 minutes spent in the coordination of care today.    Jonnie Finner, DO [facility] Pager [phone]  If 7PM-7AM, please contact night-coverage [email or web address] Password [facility] 02/18/2019, 3:28 PM

## 2019-02-18 NOTE — TOC Initial Note (Addendum)
Transition of Care [facility]) - Initial/Assessment Note    Patient Details  Name: Shannon Blair MRN: [id number] Date of Birth: October 01, 1931  Transition of Care [facility]) CM/SW Contact:    Eileen Stanford, LCSW Phone Number: 02/18/2019, 1:48 PM  Clinical Narrative:  CSW spoke with pt's Legal Guardian. Pt lives at home with nephew. Pt's POA speaks to pt everyday and visits several times a week. Pt's guardian states if pt is able to get back to walking on her own they would rather have pt home with [facility]. Pt was active with [facility] prior to admission. Pt was getting HHPT, OT. If pt is unable to walk on her own guardian is agreeable to SNF. Pt's guardian would want [facility] as pt has been there before. CSW will continue to follow for disposition.                 Expected Discharge Plan: [place] Barriers to Discharge: Continued Medical Work up   Patient Goals and CMS Choice Patient states their goals for this hospitalization and ongoing recovery are:: "to get her home if she is able"   Choice offered to / list presented to : [place] / Guardian  Expected Discharge Plan and Services Expected Discharge Plan: [place] In-house Referral: NA   Post Acute Care Choice: [place] arrangements for the past 2 months: [place]: [place]        Prior Living Arrangements/Services Living arrangements for the past 2 months: Single Family Home Lives with:: Relatives(nephew) Patient language and need for interpreter reviewed:: Yes Do you feel safe going back to the place where you live?: Yes      Need for Family Participation in Patient Care: Yes (Comment) Care giver support system in place?: Yes (comment) Current home services: Home PT, Home OT Criminal Activity/Legal Involvement Pertinent to Current Situation/Hospitalization: No - Comment as needed  Activities of Daily Living       Permission Sought/Granted Permission sought to share information with : Other (comment)(POA)    Share Information with NAME: Arville Go  Permission granted to share info w AGENCY: [facility], [facility]  Permission granted to share info w Relationship: POA  Permission granted to share info w Contact Information: [phone]  Emotional Assessment Appearance:: Appears stated age Attitude/Demeanor/Rapport: Unable to Assess Affect (typically observed): Unable to Assess Orientation: : Oriented to Self Alcohol / Substance Use: Not Applicable Psych Involvement: No (comment)  Admission diagnosis:  Weakness, Hypertensive Patient Active Problem List   Diagnosis Date Noted  . Pressure injury of skin 02/18/2019  . Complete heart block ([place]) 02/16/2019  . Hypertensive urgency   . Stage 3 chronic kidney disease   . Weakness generalized 11/20/2018  . Acute encephalopathy 08/06/2018  . Pyelonephritis 08/06/2018  . ARF (acute renal failure) ([place]) 08/06/2018  . Hydronephrosis 08/06/2018  . UTI (urinary tract infection) 07/06/2018  . Acute metabolic encephalopathy [id number]  . AKI (acute kidney injury) ([place]) 07/06/2018  . Dementia ([place])   . Spinal stenosis, lumbar region with neurogenic claudication 07/26/2016  . Pseudoaphakia 11/13/2011  . Nonspecific (abnormal) findings on radiological and other examination of gastrointestinal tract 06/26/2011  . Colitis 06/25/2011  . Rectal bleeding 06/25/2011  . History of rectal or anal cancer 06/25/2011  . HTN (hypertension) 06/25/2011  PCP:  Merrilee Seashore, MD Pharmacy:   [id number] - [place], [place] - [address] [facility] [address] [place] [address] Phone: [phone] Fax: [phone]  [facility] - [place], [place] [address] Phone: [phone] Fax: [phone]     Social Determinants of Health (SDOH) Interventions     Readmission Risk Interventions No flowsheet data found.

## 2019-02-18 NOTE — NC FL2 (Deleted)
[place] MEDICAID FL2 LEVEL OF CARE SCREENING TOOL     IDENTIFICATION  Patient Name: Shannon Blair Birthdate: [birth year] Sex: female Admission Date (Current Location): 02/16/2019  [facility] and [place] Number:  Herbalist and Address:  [facility] [place], [place] [address], [place], [place] [address]      Provider Number: [id number]  Attending Physician Name and Address:  Jonnie Finner, DO  Relative Name and Phone Number:  Charise Killian Legal Guardian   [phone]    Current Level of Care: Hospital Recommended Level of Care: [place] Prior Approval Number:    Date Approved/Denied:   PASRR Number: [id number]  Discharge Plan: SNF    Current Diagnoses: Patient Active Problem List   Diagnosis Date Noted  . Pressure injury of skin 02/18/2019  . Complete heart block ([place]) 02/16/2019  . Hypertensive urgency   . Stage 3 chronic kidney disease   . Weakness generalized 11/20/2018  . Acute encephalopathy 08/06/2018  . Pyelonephritis 08/06/2018  . ARF (acute renal failure) ([place]) 08/06/2018  . Hydronephrosis 08/06/2018  . UTI (urinary tract infection) 07/06/2018  . Acute metabolic encephalopathy [id number]  . AKI (acute kidney injury) ([place]) 07/06/2018  . Dementia ([place])   . Spinal stenosis, lumbar region with neurogenic claudication 07/26/2016  . Pseudoaphakia 11/13/2011  . Nonspecific (abnormal) findings on radiological and other examination of gastrointestinal tract 06/26/2011  . Colitis 06/25/2011  . Rectal bleeding 06/25/2011  . History of rectal or anal cancer 06/25/2011  . HTN (hypertension) 06/25/2011    Orientation RESPIRATION BLADDER Height & Weight     Self  Normal External catheter, Incontinent(placed 11/30) Weight: 135 lb 2.3 oz (61.3 kg) Height:  5\' 4"  (162.6 cm)  BEHAVIORAL SYMPTOMS/MOOD NEUROLOGICAL BOWEL NUTRITION STATUS      Continent Diet(heart healthy)  AMBULATORY STATUS COMMUNICATION OF NEEDS Skin    Verbally PU Stage and Appropriate Care(Closed incision, left chest, guaze dressing, change PRN) PU Stage 1 Dressing: (Sacrum, foam dressing, change every 3 days)                     Personal Care Assistance Level of Assistance  Bathing, Feeding, Dressing           Functional Limitations Info  Sight, Hearing, Speech Sight Info: Adequate Hearing Info: Adequate Speech Info: Adequate    SPECIAL CARE FACTORS FREQUENCY  PT (By licensed PT), OT (By licensed OT)     PT Frequency: 5x OT Frequency: 5x            Contractures Contractures Info: Not present    Additional Factors Info  Code Status, Allergies Code Status Info: DNR Allergies Info: Tape, Tilapia (Fish Allergy)           Current Medications (02/18/2019):  This is the current hospital active medication list Current Facility-Administered Medications  Medication Dose Route Frequency Provider Last Rate Last Dose  . 0.9 %  sodium chloride infusion   Intravenous Continuous Jani Gravel, MD   Stopped at 02/18/19 0930  . acetaminophen (TYLENOL) tablet 650 mg  650 mg Oral Q6H PRN Jani Gravel, MD       Or  . acetaminophen (TYLENOL) suppository 650 mg  650 mg Rectal Q6H PRN Jani Gravel, MD      . acetaminophen (TYLENOL) tablet 325-650 mg  325-650 mg Oral Q4H PRN Camnitz, Will Hassell Done, MD      . amLODipine (NORVASC) tablet 2.5 mg  2.5 mg Oral Once Cherylann Ratel A, DO      . [  START ON 02/19/2019] amLODipine (NORVASC) tablet 5 mg  5 mg Oral Daily Kyle, Tyrone A, DO      . aspirin EC tablet 81 mg  81 mg Oral QPC breakfast Jani Gravel, MD      . cefTRIAXone (ROCEPHIN) 1 g in sodium chloride 0.9 % 100 mL IVPB  1 g Intravenous Q24H Jani Gravel, MD      . Chlorhexidine Gluconate Cloth 2 % PADS 6 each  6 each Topical Daily Jani Gravel, MD   6 each at 02/17/19 [phone]  . docusate sodium (COLACE) capsule 100 mg  100 mg Oral QHS Jani Gravel, MD      . enoxaparin (LOVENOX) injection 30 mg  30 mg Subcutaneous QHS Jani Gravel, MD   30 mg at 02/17/19  2235  . hydrALAZINE (APRESOLINE) injection 10 mg  10 mg Intravenous Q6H PRN Sueanne Margarita, MD   10 mg at 02/18/19 0217  . ondansetron (ZOFRAN) injection 4 mg  4 mg Intravenous Q6H PRN Camnitz, Will Hassell Done, MD      . polyethylene glycol (MIRALAX / GLYCOLAX) packet 17 g  17 g Oral QPC breakfast Jani Gravel, MD      . sodium bicarbonate injection 50 mEq  50 mEq Intravenous Once Jani Gravel, MD      . sodium polystyrene (KAYEXALATE) 15 GM/60ML suspension 15 g  15 g Oral Once Jani Gravel, MD         Discharge Medications: Please see discharge summary for a list of discharge medications.  Relevant Imaging Results:  Relevant Lab Results:   Additional Information [id number]  Gerrianne Scale Laveta Gilkey, LCSW

## 2019-02-18 NOTE — Discharge Instructions (Signed)
After Your Pacemaker   You have a Med[employer]onic Pacemaker   Do not lift your arm above shoulder height for 1 week after your procedure. After 7 days, you may progress as below.     Monday February 24, 2019  Tuesday February 25, 2019 Wednesday February 26, 2019 Thursday February 27, 2019    Do not lift, push, pull, or carry anything over 10 pounds with the affected arm until 6 weeks (Monday March 31, 2019) after your procedure.    Do not drive until your wound check, or until instructed by your healthcare provider that you are safe to do so.    Monitor your pacemaker site for redness, swelling, and drainage. Call the device clinic at [phone] if you experience these symptoms or fever/chills.   If your incision is sealed with Steri-strips or staples, you may shower 10 days after your procedure. Do not remove the steri-strips or let the shower hit directly on your site. You may wash around your site with soap and water. Avoid lotions, ointments, or perfumes over your incision until it is well-healed.   You may use a hot tub or a pool AFTER your wound check appointment if the incision is completely closed.   Your Pacemaker may be MRI compatible. We will discuss this at your first follow up/wound check. .    Remote monitoring is used to monitor your pacemaker from home. This monitoring is scheduled every 91 days by our office. It allows [place] to keep an eye on the functioning of your device to ensure it is working properly. You will routinely see your Electrophysiologist annually (more often if necessary).    Pacemaker Implantation, Care After This sheet gives you information about how to care for yourself after your procedure. Your health care provider may also give you more specific instructions. If you have problems or questions, contact your health care provider. What can I expect after the procedure? After the procedure, it is common to have:  Mild pain.  Slight bruising.  Some  swelling over the incision.  A slight bump over the skin where the device was placed. Sometimes, it is possible to feel the device under the skin. This is normal.  You should received your Pacemaker ID card within 4-8 weeks. Follow these instructions at home: Medicines  Take over-the-counter and prescription medicines only as told by your health care provider.  If you were prescribed an antibiotic medicine, take it as told by your health care provider. Do not stop taking the antibiotic even if you start to feel better. Wound care     Do not remove the bandage on your chest until directed to do so by your health care provider.  After your bandage is removed, you may see pieces of tape called skin adhesive strips over the area where the cut was made (incision site). Let them fall off on their own.  Check the incision site every day to make sure it is not infected, bleeding, or starting to pull apart.  Do not use lotions or ointments near the incision site unless directed to do so.  Keep the incision area clean and dry for 7 days after the procedure or as directed by your health care provider. It takes several weeks for the incision site to completely heal.  Do not take baths, swim, or use a hot tub for 7-10 days or as otherwise directed by your health care provider. Activity  Do not drive or use heavy machinery while taking prescription  pain medicine.  Do not drive for 24 hours if you were given a medicine to help you relax (sedative).  Check with your health care provider before you start to drive or play sports.  Avoid sudden jerking, pulling, or chopping movements that pull your upper arm far away from your body. Avoid these movements for at least 6 weeks or as long as told by your health care provider.  Do not lift your upper arm above your shoulders for at least 6 weeks or as long as told by your health care provider. This means no tennis, golf, or swimming.  You may go back  to work when your health care provider says it is okay. Pacemaker care  You may be shown how to transfer data from your pacemaker through the phone to your health care provider.  Always let all health care providers know about your pacemaker before you have any medical procedures or tests.  Wear a medical ID bracelet or necklace stating that you have a pacemaker. Carry a pacemaker ID card with you at all times.  Your pacemaker battery will last for 5-15 years. Routine checks by your health care provider will let the health care provider know when the battery is starting to run down. The pacemaker will need to be replaced when the battery starts to run down.  Do not use amateur Chief of Staff. Other electrical devices are safe to use, including power tools, lawn mowers, and speakers. If you are unsure of whether something is safe to use, ask your health care provider.  When using your cell phone, hold it to the ear opposite the pacemaker. Do not leave your cell phone in a pocket over the pacemaker.  Avoid places or objects that have a strong electric or magnetic field, including: ? Airport Herbalist. When at the airport, let officials know that you have a pacemaker. ? Power plants. ? Large electrical generators. ? Radiofrequency transmission towers, such as cell phone and radio towers. General instructions  Weigh yourself every day. If you suddenly gain weight, fluid may be building up in your body.  Keep all follow-up visits as told by your health care provider. This is important. Contact a health care provider if:  You gain weight suddenly.  Your legs or feet swell.  It feels like your heart is fluttering or skipping beats (heart palpitations).  You have chills or a fever.  You have more redness, swelling, or pain around your incisions.  You have more fluid or blood coming from your incisions.  Your incisions feel warm to the touch.  You  have pus or a bad smell coming from your incisions. Get help right away if:  You have chest pain.  You have trouble breathing or are short of breath.  You become extremely tired.  You are light-headed or you faint. This information is not intended to replace advice given to you by your health care provider. Make sure you discuss any questions you have with your health care provider.

## 2019-02-18 NOTE — Progress Notes (Signed)
Patient transported to CT and back without complication. 

## 2019-02-19 DIAGNOSIS — R4189 Other symptoms and signs involving cognitive functions and awareness: Secondary | ICD-10-CM

## 2019-02-19 LAB — COMPREHENSIVE METABOLIC PANEL
ALT: 16 U/L (ref 0–44)
AST: 20 U/L (ref 15–41)
Albumin: 3.1 g/dL — ABNORMAL LOW (ref 3.5–5.0)
Alkaline Phosphatase: 106 U/L (ref 38–126)
Anion gap: 12 (ref 5–15)
BUN: 33 mg/dL — ABNORMAL HIGH (ref 8–23)
CO2: 18 mmol/L — ABNORMAL LOW (ref 22–32)
Calcium: 9.9 mg/dL (ref 8.9–10.3)
Chloride: 112 mmol/L — ABNORMAL HIGH (ref 98–111)
Creatinine, Ser: 1.38 mg/dL — ABNORMAL HIGH (ref 0.44–1.00)
GFR calc Af Amer: 41 mL/min — ABNORMAL LOW (ref >60)
GFR calc non Af Amer: 35 mL/min — ABNORMAL LOW (ref >60)
Glucose, Bld: 121 mg/dL — ABNORMAL HIGH (ref 70–99)
Potassium: 4 mmol/L (ref 3.5–5.1)
Sodium: 142 mmol/L (ref 135–145)
Total Bilirubin: 0.7 mg/dL (ref 0.3–1.2)
Total Protein: 5.9 g/dL — ABNORMAL LOW (ref 6.5–8.1)

## 2019-02-19 LAB — CBC WITH DIFFERENTIAL/PLATELET
Abs Immature Granulocytes: 0.15 10*3/uL — ABNORMAL HIGH (ref 0.00–0.07)
Basophils Absolute: 0.1 10*3/uL (ref 0.0–0.1)
Basophils Relative: 0 %
Eosinophils Absolute: 0 10*3/uL (ref 0.0–0.5)
Eosinophils Relative: 0 %
HCT: 37.3 % (ref 36.0–46.0)
Hemoglobin: 12.3 g/dL (ref 12.0–15.0)
Immature Granulocytes: 1 %
Lymphocytes Relative: 7 %
Lymphs Abs: 1.1 10*3/uL (ref 0.7–4.0)
MCH: 30.3 pg (ref 26.0–34.0)
MCHC: 33 g/dL (ref 30.0–36.0)
MCV: 91.9 fL (ref 80.0–100.0)
Monocytes Absolute: 1 10*3/uL (ref 0.1–1.0)
Monocytes Relative: 7 %
Neutro Abs: 12.6 10*3/uL — ABNORMAL HIGH (ref 1.7–7.7)
Neutrophils Relative %: 85 %
Platelets: 216 10*3/uL (ref 150–400)
RBC: 4.06 MIL/uL (ref 3.87–5.11)
RDW: 14.7 % (ref 11.5–15.5)
WBC: 15 10*3/uL — ABNORMAL HIGH (ref 4.0–10.5)
nRBC: 0 % (ref 0.0–0.2)

## 2019-02-19 LAB — MAGNESIUM: Magnesium: 2.2 mg/dL (ref 1.7–2.4)

## 2019-02-19 LAB — AMMONIA: Ammonia: 24 umol/L (ref 9–35)

## 2019-02-19 NOTE — Progress Notes (Signed)
Pt arrived to unit from [facility]. Oriented to room and unit. CHG wipes applied. VSS. Call bell in reach. Will continue to monitor.  Arletta Bale, RN

## 2019-02-19 NOTE — Progress Notes (Signed)
Palliative:  85 y.o. female  with past medical history of dementia, HTN, osteoarthritis, diverticulosis, h/o anal cancer admitted on 02/16/2019 with weakness and found to have complete heart block and UTI. Pacemaker placed 02/17/19. She remains pleasantly confused and has required safety sitter at bedside. She has been unresponsive x 2 days with unknown etiology.   I met again today at [facility] bedside. She continues to be unresponsive other than an occasional slight grimace. No improvement since yesterday.   I called and spoke with her HCPOA, friend Arville Go. I explained that Semaja is no better today. Unfortunately no reversible etiology identified to improve her condition. I discussed my concern that Liliana will continue to decline and is high risk for decline to EOL. I explain that Diann is unable to eat/drink in her condition and will therefore only become more weak and at risk for further complications and infections to settle in her state. These complications will only worsen her condition. Joann understands and tells me that the doctor has recommended hospice but she has not had good experience with hospice and does not agree with this option. She does not exactly agree with hospice philosophy. Arville Go is also concerned about answering to Juanell's family for the decisions she makes.   I discussed with her GOC for [facility] while hospitalized. She does not desire aggressive, invasive or life prolonging measures. I did discuss ensuring comfort for Jetaime and Arville Go was very concerned about this. I explained the difference in scheduled medication at EOL and providing adequate symptom management so that Dezeray is not suffering. She agrees that she does not want Jadee to be in pain. She does NOT want her to receive morphine (I explained that we can utilize fentanyl or dilaudid and do not have to give morphine if needed). We agree to continue current therapies and treatment with antibiotics, IVF, and oxygen but  also treat any discomfort or suffering and to reassess plan tomorrow.   All questions/concerns addressed.   Exam: Flushed, clammy. Unresponsive. Breathing regular, unlabored. Abd soft, flat.   Plan: - Continue current interventions.  - PRN fentanyl 25 mcg for pain/SOB IF she becomes symptomatic.  - HCPOA resistant to comfort measures at this time but will reassess plan/GOC tomorrow.   25 min   Vinie Sill, NP Palliative Medicine Team Pager [phone] (Please see [email or web address] for schedule) Team Phone [phone]    Greater than 50%  of this time was spent counseling and coordinating care related to the above assessment and plan

## 2019-02-19 NOTE — Progress Notes (Signed)
PROGRESS NOTE    Shannon Blair  [id number] DOB: December 13, 1931 DOA: 02/16/2019 PCP: Merrilee Seashore, MD     Brief Narrative:  Shannon Blair is a 85 y.o.female,w dementia, hypertension, hx of recurrent UTI, now presents with generalized weakness, and thought to have UTI. Pt is unable to provide meaningful history due to dementia. During hospitalization, patient was found to have complete heart block and patient was evaluated by cardiology/EP.  She underwent dual-chamber pacemaker placement on 11/30.  New events last 24 hours / Subjective: Patient largely unresponsive since yesterday morning, no reversible causes found on CT head or CTA chest.  Patient is arousable to painful stimuli.  Assessment & Plan:   Active Problems:   Complete heart block (HCC)   Pressure injury of skin   Goals of care, counseling/discussion   Palliative care encounter    Complete heart block -Status post dual-chamber pacemaker placement 11/30 -Follow-up at device clinic/EP clinic for device care as an outpatient  Acute lower UTI, present on admission.  History of recurrent UTI -Urine culture not available, previous urine culture from earlier this year was pansensitive E. coli -Rocephin x 5 days   CKD stage IIIb -Baseline creatinine 1.3 -Stable  Hypertension  -Continue Norvasc, hydralazine as needed  Dementia -STOP Aricept -STOP Namenda ->can induce bradycardia -STOP Effexor ->can worsen hypertension -CT head unremarkable  Urinary incontinence -STOP oxybutynin -> can affect conduction  Acute encephalopathy, lethargy  -CT head negative -CTA chest negative for PE -Ammonia 24 -Palliative care following   In agreement with assessment of the pressure ulcer as below:  Pressure Injury 02/17/19 Sacrum Medial Stage I -  Intact skin with non-blanchable redness of a localized area usually over a bony prominence. (Active)  02/17/19 0300  Location: Sacrum  Location Orientation:  Medial  Staging: Stage I -  Intact skin with non-blanchable redness of a localized area usually over a bony prominence.  Wound Description (Comments):   Present on Admission: Yes         DVT prophylaxis: Lovenox Code Status: DNR Family Communication: Spoke with POA over the phone this morning Disposition Plan: Pending clinical improvement, further conversation with palliative care team   Consultants:   Cardiology  PCCM  EP  Palliative care   Antimicrobials:  Anti-infectives (From admission, onward)   Start     Dose/Rate Route Frequency Ordered Stop   02/17/19 2100  ceFAZolin (ANCEF) IVPB 1 g/50 mL premix     1 g 100 mL/hr over 30 Minutes Intravenous Every 6 hours 02/17/19 1816 02/18/19 1000   02/17/19 1800  cefTRIAXone (ROCEPHIN) 1 g in sodium chloride 0.9 % 100 mL IVPB     1 g 200 mL/hr over 30 Minutes Intravenous Every 24 hours 02/16/19 2335     02/17/19 1115  gentamicin (GARAMYCIN) 80 mg in sodium chloride 0.9 % 500 mL irrigation     80 mg Irrigation On call 02/17/19 1105 02/17/19 1740   02/17/19 1115  ceFAZolin (ANCEF) IVPB 2g/100 mL premix     2 g 200 mL/hr over 30 Minutes Intravenous On call 02/17/19 1105 02/17/19 1658   02/16/19 1945  cefTRIAXone (ROCEPHIN) 1 g in sodium chloride 0.9 % 100 mL IVPB     1 g 200 mL/hr over 30 Minutes Intravenous  Once 02/16/19 1932 02/16/19 2020        Objective: Vitals:   02/19/19 0800 02/19/19 0900 02/19/19 1000 02/19/19 1100  BP: (!) 174/64 (!) 159/54 (!) 162/60 (!) 163/54  Pulse: 78 80 85  79  Resp: 16 17 18 16   Temp:      TempSrc:      SpO2: 100% 100% 100% 100%  Weight:      Height:        Intake/Output Summary (Last 24 hours) at 02/19/2019 1111 Last data filed at 02/19/2019 1100 Gross per 24 hour  Intake 1068.43 ml  Output 1200 ml  Net -131.57 ml   Filed Weights   02/17/19 0352 02/18/19 0500 02/19/19 0600  Weight: 61 kg 61.3 kg 62 kg    Examination:  General exam: Appears calm and comfortable,  unarousable, responsive to pain Respiratory system: Clear to auscultation. Respiratory effort normal. No respiratory distress. Cardiovascular system: S1 & S2 heard, RRR. No murmurs. No pedal edema. Gastrointestinal system: Abdomen is nondistended, soft and nontender.  Central nervous system: Somnolent Extremities: Symmetric in appearance    Data Reviewed: I have personally reviewed following labs and imaging studies  CBC: Recent Labs  Lab 02/16/19 1802 02/17/19 0327 02/18/19 0218 02/18/19 1437 02/18/19 2028 02/19/19 0303  WBC 8.4 10.4 15.0*  --   --  15.0*  NEUTROABS 5.5  --  12.7*  --   --  12.6*  HGB 13.2 14.3 12.8 11.2* 11.6* 12.3  HCT 38.7 41.8 38.2 33.0* 34.0* 37.3  MCV 89.2 88.6 90.1  --   --  91.9  PLT 222 245 185  --   --  [id number]   Basic Metabolic Panel: Recent Labs  Lab 02/16/19 1600 02/17/19 0327 02/18/19 0218 02/18/19 1437 02/18/19 2028 02/19/19 0303  NA 140 138 142 140 139 142  K 5.3* 4.2 4.2 3.9 3.8 4.0  CL 107 108 110  --   --  112*  CO2 22 19* 18*  --   --  18*  GLUCOSE 85 110* 122*  --   --  121*  BUN 29* 24* 26*  --   --  33*  CREATININE 1.31* 1.18* 1.34*  --   --  1.38*  CALCIUM 10.5* 10.1 10.0  --   --  9.9  MG  --   --  2.0  --   --  2.2  PHOS  --   --  3.4  --   --   --    GFR: Estimated Creatinine Clearance: 26.2 mL/min (A) (by C-G formula based on SCr of 1.38 mg/dL (H)). Liver Function Tests: Recent Labs  Lab 02/17/19 0254 02/17/19 0327 02/18/19 0218 02/18/19 1000 02/19/19 0303  AST 38 47*  --  23 20  ALT 46* 42  --  24 16  ALKPHOS 170* 163*  --  113 106  BILITOT 0.7 1.2  --  0.6 0.7  PROT 7.2 6.2*  --  6.0* 5.9*  ALBUMIN 3.9 3.6 3.5 3.3* 3.1*   No results for input(s): LIPASE, AMYLASE in the last 168 hours. Recent Labs  Lab 02/18/19 1000 02/19/19 1026  AMMONIA 13 24   Coagulation Profile: No results for input(s): INR, PROTIME in the last 168 hours. Cardiac Enzymes: No results for input(s): CKTOTAL, CKMB, CKMBINDEX,  TROPONINI in the last 168 hours. BNP (last 3 results) No results for input(s): PROBNP in the last 8760 hours. HbA1C: No results for input(s): HGBA1C in the last 72 hours. CBG: Recent Labs  Lab 02/17/19 1131  GLUCAP 102*   Lipid Profile: No results for input(s): CHOL, HDL, LDLCALC, TRIG, CHOLHDL, LDLDIRECT in the last 72 hours. Thyroid Function Tests: Recent Labs    02/17/19 0254  TSH 3.419  Anemia Panel: No results for input(s): VITAMINB12, FOLATE, FERRITIN, TIBC, IRON, RETICCTPCT in the last 72 hours. Sepsis Labs: No results for input(s): PROCALCITON, LATICACIDVEN in the last 168 hours.  Recent Results (from the past 240 hour(s))  SARS CORONAVIRUS 2 (TAT 6-24 HRS) Nasopharyngeal Nasopharyngeal Swab     Status: None   Collection Time: 02/17/19 12:09 AM   Specimen: Nasopharyngeal Swab  Result Value Ref Range Status   SARS Coronavirus 2 NEGATIVE NEGATIVE Final    Comment: (NOTE) SARS-CoV-2 target nucleic acids are NOT DETECTED. The SARS-CoV-2 RNA is generally detectable in upper and lower respiratory specimens during the acute phase of infection. Negative results do not preclude SARS-CoV-2 infection, do not rule out co-infections with other pathogens, and should not be used as the sole basis for treatment or other patient management decisions. Negative results must be combined with clinical observations, patient history, and epidemiological information. The expected result is Negative. Fact Sheet for Patients: SugarRoll.be Fact Sheet for Healthcare Providers: https://www.woods-mathews.com/ This test is not yet approved or cleared by the [place] FDA and  has been authorized for detection and/or diagnosis of SARS-CoV-2 by FDA under an Emergency Use Authorization (EUA). This EUA will remain  in effect (meaning this test can be used) for the duration of the COVID-19 declaration under Section 56 4(b)(1) of the Act, 21  U.S.C. section 360bbb-3(b)(1), unless the authorization is terminated or revoked sooner. Performed at [facility], [place] [address] [place], [place] [address]   MRSA PCR Screening     Status: None   Collection Time: 02/17/19  3:15 AM   Specimen: Nasal Mucosa; Nasopharyngeal  Result Value Ref Range Status   MRSA by PCR NEGATIVE NEGATIVE Final    Comment:        The GeneXpert MRSA Assay (FDA approved for NASAL specimens only), is one component of a comprehensive MRSA colonization surveillance program. It is not intended to diagnose MRSA infection nor to guide or monitor treatment for MRSA infections. Performed at [facility], [place] [address], [place], [place] [address]   Surgical PCR screen     Status: None   Collection Time: 02/17/19  3:15 AM   Specimen: Nasal Mucosa; Nasal Swab  Result Value Ref Range Status   MRSA, PCR NEGATIVE NEGATIVE Final   Staphylococcus aureus NEGATIVE NEGATIVE Final    Comment: (NOTE) The Xpert SA Assay (FDA approved for NASAL specimens in patients 22 years of age and older), is one component of a comprehensive surveillance program. It is not intended to diagnose infection nor to guide or monitor treatment. Performed at [facility], [place] [address], [place], [place] [address]       Radiology Studies: Dg Chest 2 View  Result Date: 02/18/2019 CLINICAL DATA:  ICD placement. EXAM: CHEST - 2 VIEW COMPARISON:  Two-view chest x-ray 07/05/2018 FINDINGS: The heart size is exaggerate by low lung volumes. Mild pulmonary vascular congestion is present. Bibasilar airspace opacities likely reflect atelectasis. A dual-chamber pacemaker has been placed. Leads terminate in the right atrium and right ventricle. Atherosclerotic changes are noted at the aorta. IMPRESSION: 1. Interval placement of dual-chamber pacemaker without radiographic evidence for complication. 2. Low lung volumes and mild bibasilar atelectasis. 3. Mild pulmonary vascular  congestion. Electronically Signed   By: San Morelle M.D.   On: 02/18/2019 07:25   Ct Head Wo Contrast  Result Date: 02/18/2019 CLINICAL DATA:  Altered level of consciousness (LOC), unexplained. Additional history provided: Patient became unresponsive around 8:30-9  AM. EXAM: CT HEAD WITHOUT CONTRAST TECHNIQUE: Contiguous axial images were obtained from the base of the skull through the vertex without intravenous contrast. COMPARISON:  Head CT 11/18/2018 FINDINGS: Brain: Mildly motion degraded examination. No evidence of acute intracranial hemorrhage. No demarcated cortical infarction. No evidence of intracranial mass. No midline shift or extra-axial fluid collection. Advanced patchy and confluent hypodensity within the cerebral white matter is nonspecific, but consistent with chronic small vessel ischemic disease. A tiny chronic lacunar infarct within the right basal ganglia was better appreciated on prior head CT 11/18/2018. Mild generalized parenchymal atrophy. Vascular: No hyperdense vessel.  Atherosclerotic calcifications. Skull: Normal. Negative for fracture or focal lesion. Sinuses/Orbits: Visualized orbits demonstrate no acute abnormality. Minimal scattered paranasal sinus mucosal thickening. No significant mastoid effusion. IMPRESSION: 1. No CT evidence of acute intracranial abnormality. 2. Generalized parenchymal atrophy with advanced chronic small vessel ischemic disease. Electronically Signed   By: Kellie Simmering DO   On: 02/18/2019 10:24   Ct Angio Chest Pe W Or Wo Contrast  Result Date: 02/18/2019 CLINICAL DATA:  Hypoxia. Altered mental status. EXAM: CT ANGIOGRAPHY CHEST WITH CONTRAST TECHNIQUE: Multidetector CT imaging of the chest was performed using the standard protocol during bolus administration of intravenous contrast. Multiplanar CT image reconstructions and MIPs were obtained to evaluate the vascular anatomy. CONTRAST:  153mL OMNIPAQUE IOHEXOL 300 MG/ML  SOLN COMPARISON:  CT scan  dated 03/03/2016 FINDINGS: Cardiovascular: Satisfactory opacification of the pulmonary arteries to the segmental level. No evidence of pulmonary embolism. New mild cardiomegaly. Aortic atherosclerosis. Scattered coronary artery calcifications. No pericardial effusion. RV LV ratio is normal. Pacemaker in place. No pericardial effusion. Mediastinum/Nodes: No enlarged mediastinal, hilar, or axillary lymph nodes. Thyroid gland, trachea, and esophagus demonstrate no significant findings. Lungs/Pleura: Small bilateral pleural effusions, right greater than left with compressive atelectasis in the dependent portions of both lungs. No infiltrates. Small intrapulmonary node along the minor fissure on the right not of any consequence. Upper Abdomen: Normal. Musculoskeletal: No acute bone abnormality. Osteophytes fuse much of the thoracic spine. Review of the MIP images confirms the above findings. IMPRESSION: 1. No pulmonary emboli. 2. New mild cardiomegaly. 3. Small bilateral pleural effusions, right greater than left with compressive atelectasis in the dependent portions of both lungs. 4. Aortic Atherosclerosis (ICD10-I70.0). Electronically Signed   By: Lorriane Shire M.D.   On: 02/18/2019 17:33      Scheduled Meds:  amLODipine  2.5 mg Oral Once   amLODipine  5 mg Oral Daily   aspirin EC  81 mg Oral QPC breakfast   Chlorhexidine Gluconate Cloth  6 each Topical Daily   docusate sodium  100 mg Oral QHS   enoxaparin (LOVENOX) injection  30 mg Subcutaneous QHS   metoprolol tartrate  2.5 mg Intravenous Q6H   polyethylene glycol  17 g Oral QPC breakfast   sodium bicarbonate  50 mEq Intravenous Once   sodium polystyrene  15 g Oral Once   Continuous Infusions:  sodium chloride 50 mL/hr at 02/19/19 1100   cefTRIAXone (ROCEPHIN)  IV 1 g (02/18/19 1825)     LOS: 3 days      Time spent: 40 minutes   Dessa Phi, DO [facility] 02/19/2019, 11:11 AM   Available via Epic secure chat  7am-7pm After these hours, please refer to coverage provider listed on [email or web address]

## 2019-02-20 ENCOUNTER — Encounter (HOSPITAL_COMMUNITY): Payer: Self-pay | Admitting: General Practice

## 2019-02-20 LAB — BASIC METABOLIC PANEL
Anion gap: 8 (ref 5–15)
BUN: 44 mg/dL — ABNORMAL HIGH (ref 8–23)
CO2: 19 mmol/L — ABNORMAL LOW (ref 22–32)
Calcium: 9.6 mg/dL (ref 8.9–10.3)
Chloride: 117 mmol/L — ABNORMAL HIGH (ref 98–111)
Creatinine, Ser: 1.11 mg/dL — ABNORMAL HIGH (ref 0.44–1.00)
GFR calc Af Amer: 53 mL/min — ABNORMAL LOW (ref >60)
GFR calc non Af Amer: 46 mL/min — ABNORMAL LOW (ref >60)
Glucose, Bld: 85 mg/dL (ref 70–99)
Potassium: 3.8 mmol/L (ref 3.5–5.1)
Sodium: 144 mmol/L (ref 135–145)

## 2019-02-20 LAB — CBC
HCT: 32.1 % — ABNORMAL LOW (ref 36.0–46.0)
Hemoglobin: 10.5 g/dL — ABNORMAL LOW (ref 12.0–15.0)
MCH: 30.4 pg (ref 26.0–34.0)
MCHC: 32.7 g/dL (ref 30.0–36.0)
MCV: 93 fL (ref 80.0–100.0)
Platelets: 160 10*3/uL (ref 150–400)
RBC: 3.45 MIL/uL — ABNORMAL LOW (ref 3.87–5.11)
RDW: 14.4 % (ref 11.5–15.5)
WBC: 8.4 10*3/uL (ref 4.0–10.5)
nRBC: 0 % (ref 0.0–0.2)

## 2019-02-20 LAB — BLOOD GAS, ARTERIAL
Acid-base deficit: 3.9 mmol/L — ABNORMAL HIGH (ref 0.0–2.0)
Bicarbonate: 20 mmol/L (ref 20.0–28.0)
FIO2: 36
O2 Saturation: 98 %
Patient temperature: 37
pCO2 arterial: 32.9 mmHg (ref 32.0–48.0)
pH, Arterial: 7.401 (ref 7.350–7.450)
pO2, Arterial: 96.3 mmHg (ref 83.0–108.0)

## 2019-02-20 MED ORDER — RESOURCE THICKENUP CLEAR PO POWD
ORAL | Status: DC | PRN
  Filled 2019-02-20: qty 125

## 2019-02-20 NOTE — Progress Notes (Signed)
Patient with 24/7 care giver at home, his name is Shannon Blair.  Will arrive at [facility] 12/3 to work with physical therapy and patient. Nelda Bucks, Bettina Gavia RN

## 2019-02-20 NOTE — Progress Notes (Signed)
Pt requires SLP to evaluate and treat due to Pt 's unable to swallow and eat any food, and uable to followed simple commands.  Shannon Blair [id number]

## 2019-02-20 NOTE — Evaluation (Signed)
Physical Therapy Evaluation Patient Details Name: NANCEY KREITZ MRN: [id number] DOB: 04/07/1931 Today's Date: 02/20/2019   History of Present Illness  85yo female presenting with weakness and possible UTI, complete heart block identified via EKG. She received surgery for pacemaker placement 02/17/19. On 02/18/19 she became less responsive and with AMS, CT and CTA negative for acute changes that would explain this. PMH dementia, HTN, anal CA, lumbar laminectomy/decompression  Clinical Impression   Patient received in bed, daughter present and provided history and PLOF today, although somewhat limited as she does not live with the patient and does not fully know her level of function for mobility. Patient A&Ox0 with very flat affect and focused attention, follows simple cues approximately 5% of the time and provides appropriate verbal responses perhaps 10% of the time even with rigorous verbal and tactile stimulation and skilled cues. Tends to favor L side with head turned to left and eyes somewhat biased left, CT clear however. Very hypophonic and soft when she did interact with PT. Attempted bed mobility, all with need for totalA due to weakness and zero effort or initiation from patient- will need +2 assist at minimum to progress. Did respond a bit better when daughter encouraged her to participate but appropriate verbal responses/command follow still very poor. She was left in bed with all needs met and daughter present, bed alarm active. Currently recommending SNF and 24/7A, however I do understand that per chart review return home with HHPT would be ideal if possible/appropriate.     Follow Up Recommendations SNF;Supervision/Assistance - 24 hour    Equipment Recommendations  Wheelchair (measurements PT);Wheelchair cushion (measurements PT);Hospital bed;Other (comment)(hoyer lift)    Recommendations for Other Services       Precautions / Restrictions Precautions Precautions:  ICD/Pacemaker;Other (comment) Precaution Comments: dementia, standard pacemaker precautions      Mobility  Bed Mobility Overal bed mobility: Needs Assistance Bed Mobility: Rolling;Supine to Sit Rolling: Total assist   Supine to sit: Total assist     General bed mobility comments: totalA for all bed mobility, no initiation or effort  Transfers                 General transfer comment: will need +2  Ambulation/Gait             General Gait Details: will need +2  Stairs            Wheelchair Mobility    Modified Rankin (Stroke Patients Only)       Balance                                             Pertinent Vitals/Pain Pain Assessment: Faces Faces Pain Scale: No hurt Pain Intervention(s): Limited activity within patient's tolerance;Monitored during session    Home Living Family/patient expects to be discharged to:: Private residence Living Arrangements: Other relatives(staying with a cousin) Available Help at Discharge: Available 24 hours/day Type of Home: House Home Access: Stairs to enter   [employer] of Steps: 1 threshold step Home Layout: Able to live on main level with bedroom/bathroom;One level Home Equipment: Cane - single point;Shower seat;Walker - 4 wheels;Grab bars - tub/shower Additional Comments: per daughter, cannot do a lot- gets external assistance. Unsure of transfer ability as she does not live with patient.    Prior Function Level of Independence: Needs assistance   Gait / Transfers Assistance  Needed: unsure of exact levels of assist needed     Comments: can sit up and hold grandchild     Hand Dominance        Extremity/Trunk Assessment   Upper Extremity Assessment Upper Extremity Assessment: Generalized weakness;Difficult to assess due to impaired cognition    Lower Extremity Assessment Lower Extremity Assessment: Generalized weakness;Difficult to assess due to impaired  cognition    Cervical / Trunk Assessment Cervical / Trunk Assessment: Kyphotic  Communication   Communication: No difficulties  Cognition Arousal/Alertness: Lethargic Behavior During Therapy: Flat affect Overall Cognitive Status: History of cognitive impairments - at baseline Area of Impairment: Orientation;Attention;Memory;Following commands;Safety/judgement;Problem solving;Awareness                 Orientation Level: Disoriented to;Place;Time;Situation;Person Current Attention Level: Focused Memory: Decreased short-term memory;Decreased recall of precautions Following Commands: Follows one step commands inconsistently Safety/Judgement: Decreased awareness of safety;Decreased awareness of deficits Awareness: Intellectual Problem Solving: Slow processing;Decreased initiation;Difficulty sequencing;Requires verbal cues;Requires tactile cues General Comments: very flat affect and focused attention, responds appropriately to PT perhaps 10% of the time and follows cues maybe 5% of the time even with rigorous verbal and tactile stimulation. Head and eyes turned left, seems to have difficulty attending to right.      General Comments General comments (skin integrity, edema, etc.): DNT balance, unable to get to EOB    Exercises     Assessment/Plan    PT Assessment Patient needs continued PT services  PT Problem List Decreased strength;Decreased cognition;Decreased knowledge of use of DME;Decreased activity tolerance;Decreased safety awareness;Decreased balance;Decreased knowledge of precautions;Decreased mobility;Decreased coordination       PT Treatment Interventions DME instruction;Balance training;Gait training;Neuromuscular re-education;Stair training;Cognitive remediation;Functional mobility training;Patient/family education;Therapeutic activities;Therapeutic exercise    PT Goals (Current goals can be found in the Care Plan section)  Acute Rehab PT Goals Patient Stated  Goal: at least be able to transfer to chair PT Goal Formulation: With family Time For Goal Achievement: 03/06/19 Potential to Achieve Goals: Fair    Frequency Min 3X/week   Barriers to discharge Other (comment) daughter unsure of full current level of function, unsure if nephew will be able to provide necessary support    Co-evaluation               AM-PAC PT "6 Clicks" Mobility  Outcome Measure Help needed turning from your back to your side while in a flat bed without using bedrails?: Total Help needed moving from lying on your back to sitting on the side of a flat bed without using bedrails?: Total Help needed moving to and from a bed to a chair (including a wheelchair)?: Total Help needed standing up from a chair using your arms (e.g., wheelchair or bedside chair)?: Total Help needed to walk in hospital room?: Total Help needed climbing 3-5 steps with a railing? : Total 6 Click Score: 6    End of Session   Activity Tolerance: Patient limited by lethargy Patient left: in bed;with call bell/phone within reach;with bed alarm set;with family/visitor present   PT Visit Diagnosis: Difficulty in walking, not elsewhere classified (R26.2);Muscle weakness (generalized) (M62.81);Other abnormalities of gait and mobility (R26.89)    Time: 0272-5366 PT Time Calculation (min) (ACUTE ONLY): 23 min   Charges:   PT Evaluation $PT Eval High Complexity: 1 High PT Treatments $Self Care/Home Management: 8-22        Windell Norfolk, DPT, PN1   Supplemental Physical Therapist [place]    Pager [phone] Acute Rehab Office [phone]

## 2019-02-20 NOTE — Progress Notes (Signed)
PROGRESS NOTE    Shannon Blair  [id number] DOB: [birth year] DOA: 02/16/2019 PCP: Merrilee Seashore, MD     Brief Narrative:  Shannon Blair is a 85 y.o.female,w dementia, hypertension, hx of recurrent UTI, now presents with generalized weakness, and thought to have UTI. Pt is unable to provide meaningful history due to dementia. During hospitalization, patient was found to have complete heart block and patient was evaluated by cardiology/EP.  She underwent dual-chamber pacemaker placement on 11/30. Due to altered mental status, patient underwent CT head which was unremarkable.  She was also found to be hypoxemic and underwent CTA chest which was negative for PE.  Palliative care has been following as well for goals of care conversation.  New events last 24 hours / Subjective: Much more alert interactive this morning.  She is oriented to self and hospital but not to year.  She is asking for water and states that she just feels very dry.  Assessment & Plan:   Active Problems:   Complete heart block (HCC)   Pressure injury of skin   Goals of care, counseling/discussion   Palliative care encounter   Unresponsive    Complete heart block -Status post dual-chamber pacemaker placement 11/30 -Follow-up at device clinic/EP clinic for device care as an outpatient  Acute metabolic encephalopathy, lethargy  -CT head negative -Ammonia 24 -Palliative care following -Improved  Acute hypoxemic respiratory failure -CTA chest negative for PE -Repeat ABG with improvement -Remains on 4 L nasal cannula O2, continue to wean to room air as able  Acute lower UTI, present on admission.  History of recurrent UTI -Urine culture not available, previous urine culture from earlier this year was pansensitive E. coli -Rocephin x 5 days   CKD stage IIIb -Baseline creatinine 1.3 -Stable  Hypertension  -Continue Norvasc, hydralazine as needed  Dementia -STOP Aricept -STOP Namenda  ->can induce bradycardia -STOP Effexor ->can worsen hypertension  Urinary incontinence -STOP oxybutynin -> can affect conduction    In agreement with assessment of the pressure ulcer as below:  Pressure Injury 02/17/19 Sacrum Medial Stage I -  Intact skin with non-blanchable redness of a localized area usually over a bony prominence. (Active)  02/17/19 0300  Location: Sacrum  Location Orientation: Medial  Staging: Stage I -  Intact skin with non-blanchable redness of a localized area usually over a bony prominence.  Wound Description (Comments):   Present on Admission: Yes         DVT prophylaxis: Lovenox Code Status: DNR Family Communication: None at bedside  Disposition Plan: Pending clinical improvement, PT OT eval today    Consultants:   Cardiology  PCCM  EP  Palliative care   Antimicrobials:  Anti-infectives (From admission, onward)   Start     Dose/Rate Route Frequency Ordered Stop   02/17/19 2100  ceFAZolin (ANCEF) IVPB 1 g/50 mL premix     1 g 100 mL/hr over 30 Minutes Intravenous Every 6 hours 02/17/19 1816 02/18/19 1000   02/17/19 1800  cefTRIAXone (ROCEPHIN) 1 g in sodium chloride 0.9 % 100 mL IVPB     1 g 200 mL/hr over 30 Minutes Intravenous Every 24 hours 02/16/19 2335     02/17/19 1115  gentamicin (GARAMYCIN) 80 mg in sodium chloride 0.9 % 500 mL irrigation     80 mg Irrigation On call 02/17/19 1105 02/17/19 1740   02/17/19 1115  ceFAZolin (ANCEF) IVPB 2g/100 mL premix     2 g 200 mL/hr over 30 Minutes Intravenous On call  02/17/19 1105 02/17/19 1658   02/16/19 1945  cefTRIAXone (ROCEPHIN) 1 g in sodium chloride 0.9 % 100 mL IVPB     1 g 200 mL/hr over 30 Minutes Intravenous  Once 02/16/19 1932 02/16/19 2020       Objective: Vitals:   02/19/19 1923 02/19/19 2319 02/20/19 0502 02/20/19 0730  BP: (!) 158/85 (!) 165/70 (!) 145/69 139/62  Pulse: 81 83 78   Resp: 20 17 13 17   Temp: (!) 97.2 F (36.2 C) 97.7 F (36.5 C) 97.9 F (36.6 C)  98.3 F (36.8 C)  TempSrc: Axillary Oral Axillary Axillary  SpO2: 96% 100% 99% 98%  Weight:   61.8 kg   Height:        Intake/Output Summary (Last 24 hours) at 02/20/2019 1029 Last data filed at 02/20/2019 0510 Gross per 24 hour  Intake 749.33 ml  Output 552 ml  Net 197.33 ml   Filed Weights   02/18/19 0500 02/19/19 0600 02/20/19 0502  Weight: 61.3 kg 62 kg 61.8 kg    Examination: General exam: Appears calm and comfortable  Respiratory system: Clear to auscultation. Respiratory effort normal.  Cardiovascular system: S1 & S2 heard, RRR. No pedal edema. Gastrointestinal system: Abdomen is nondistended, soft and nontender. Normal bowel sounds heard. Central nervous system: Alert and oriented to self and place only Extremities: Symmetric in appearance bilaterally  Skin: No rashes, lesions or ulcers on exposed skin     Data Reviewed: I have personally reviewed following labs and imaging studies  CBC: Recent Labs  Lab 02/16/19 1802 02/17/19 0327 02/18/19 0218 02/18/19 1437 02/18/19 2028 02/19/19 0303 02/20/19 0342  WBC 8.4 10.4 15.0*  --   --  15.0* 8.4  NEUTROABS 5.5  --  12.7*  --   --  12.6*  --   HGB 13.2 14.3 12.8 11.2* 11.6* 12.3 10.5*  HCT 38.7 41.8 38.2 33.0* 34.0* 37.3 32.1*  MCV 89.2 88.6 90.1  --   --  91.9 93.0  PLT 222 245 185  --   --  216 0000000   Basic Metabolic Panel: Recent Labs  Lab 02/16/19 1600 02/17/19 0327 02/18/19 0218 02/18/19 1437 02/18/19 2028 02/19/19 0303 02/20/19 0342  NA 140 138 142 140 139 142 144  K 5.3* 4.2 4.2 3.9 3.8 4.0 3.8  CL 107 108 110  --   --  112* 117*  CO2 22 19* 18*  --   --  18* 19*  GLUCOSE 85 110* 122*  --   --  121* 85  BUN 29* 24* 26*  --   --  33* 44*  CREATININE 1.31* 1.18* 1.34*  --   --  1.38* 1.11*  CALCIUM 10.5* 10.1 10.0  --   --  9.9 9.6  MG  --   --  2.0  --   --  2.2  --   PHOS  --   --  3.4  --   --   --   --    GFR: Estimated Creatinine Clearance: 32.6 mL/min (A) (by C-G formula based on SCr of  1.11 mg/dL (H)). Liver Function Tests: Recent Labs  Lab 02/17/19 0254 02/17/19 0327 02/18/19 0218 02/18/19 1000 02/19/19 0303  AST 38 47*  --  23 20  ALT 46* 42  --  24 16  ALKPHOS 170* 163*  --  113 106  BILITOT 0.7 1.2  --  0.6 0.7  PROT 7.2 6.2*  --  6.0* 5.9*  ALBUMIN 3.9 3.6 3.5 3.3*  3.1*   No results for input(s): LIPASE, AMYLASE in the last 168 hours. Recent Labs  Lab 02/18/19 1000 02/19/19 1026  AMMONIA 13 24   Coagulation Profile: No results for input(s): INR, PROTIME in the last 168 hours. Cardiac Enzymes: No results for input(s): CKTOTAL, CKMB, CKMBINDEX, TROPONINI in the last 168 hours. BNP (last 3 results) No results for input(s): PROBNP in the last 8760 hours. HbA1C: No results for input(s): HGBA1C in the last 72 hours. CBG: Recent Labs  Lab 02/17/19 1131  GLUCAP 102*   Lipid Profile: No results for input(s): CHOL, HDL, LDLCALC, TRIG, CHOLHDL, LDLDIRECT in the last 72 hours. Thyroid Function Tests: No results for input(s): TSH, T4TOTAL, FREET4, T3FREE, THYROIDAB in the last 72 hours. Anemia Panel: No results for input(s): VITAMINB12, FOLATE, FERRITIN, TIBC, IRON, RETICCTPCT in the last 72 hours. Sepsis Labs: No results for input(s): PROCALCITON, LATICACIDVEN in the last 168 hours.  Recent Results (from the past 240 hour(s))  SARS CORONAVIRUS 2 (TAT 6-24 HRS) Nasopharyngeal Nasopharyngeal Swab     Status: None   Collection Time: 02/17/19 12:09 AM   Specimen: Nasopharyngeal Swab  Result Value Ref Range Status   SARS Coronavirus 2 NEGATIVE NEGATIVE Final    Comment: (NOTE) SARS-CoV-2 target nucleic acids are NOT DETECTED. The SARS-CoV-2 RNA is generally detectable in upper and lower respiratory specimens during the acute phase of infection. Negative results do not preclude SARS-CoV-2 infection, do not rule out co-infections with other pathogens, and should not be used as the sole basis for treatment or other patient management decisions. Negative  results must be combined with clinical observations, patient history, and epidemiological information. The expected result is Negative. Fact Sheet for Patients: SugarRoll.be Fact Sheet for Healthcare Providers: https://www.woods-mathews.com/ This test is not yet approved or cleared by the [place] FDA and  has been authorized for detection and/or diagnosis of SARS-CoV-2 by FDA under an Emergency Use Authorization (EUA). This EUA will remain  in effect (meaning this test can be used) for the duration of the COVID-19 declaration under Section 56 4(b)(1) of the Act, 21 U.S.C. section 360bbb-3(b)(1), unless the authorization is terminated or revoked sooner. Performed at [facility], [place] [address] [place], [place] [address]   MRSA PCR Screening     Status: None   Collection Time: 02/17/19  3:15 AM   Specimen: Nasal Mucosa; Nasopharyngeal  Result Value Ref Range Status   MRSA by PCR NEGATIVE NEGATIVE Final    Comment:        The GeneXpert MRSA Assay (FDA approved for NASAL specimens only), is one component of a comprehensive MRSA colonization surveillance program. It is not intended to diagnose MRSA infection nor to guide or monitor treatment for MRSA infections. Performed at [facility], [place] [address], [place], [place] [address]   Surgical PCR screen     Status: None   Collection Time: 02/17/19  3:15 AM   Specimen: Nasal Mucosa; Nasal Swab  Result Value Ref Range Status   MRSA, PCR NEGATIVE NEGATIVE Final   Staphylococcus aureus NEGATIVE NEGATIVE Final    Comment: (NOTE) The Xpert SA Assay (FDA approved for NASAL specimens in patients 34 years of age and older), is one component of a comprehensive surveillance program. It is not intended to diagnose infection nor to guide or monitor treatment. Performed at [facility], [place] [address] [place], [place] [address]       Radiology Studies: Ct Angio Chest  Pe W Or Wo  Contrast  Result Date: 02/18/2019 CLINICAL DATA:  Hypoxia. Altered mental status. EXAM: CT ANGIOGRAPHY CHEST WITH CONTRAST TECHNIQUE: Multidetector CT imaging of the chest was performed using the standard protocol during bolus administration of intravenous contrast. Multiplanar CT image reconstructions and MIPs were obtained to evaluate the vascular anatomy. CONTRAST:  116mL OMNIPAQUE IOHEXOL 300 MG/ML  SOLN COMPARISON:  CT scan dated 03/03/2016 FINDINGS: Cardiovascular: Satisfactory opacification of the pulmonary arteries to the segmental level. No evidence of pulmonary embolism. New mild cardiomegaly. Aortic atherosclerosis. Scattered coronary artery calcifications. No pericardial effusion. RV LV ratio is normal. Pacemaker in place. No pericardial effusion. Mediastinum/Nodes: No enlarged mediastinal, hilar, or axillary lymph nodes. Thyroid gland, trachea, and esophagus demonstrate no significant findings. Lungs/Pleura: Small bilateral pleural effusions, right greater than left with compressive atelectasis in the dependent portions of both lungs. No infiltrates. Small intrapulmonary node along the minor fissure on the right not of any consequence. Upper Abdomen: Normal. Musculoskeletal: No acute bone abnormality. Osteophytes fuse much of the thoracic spine. Review of the MIP images confirms the above findings. IMPRESSION: 1. No pulmonary emboli. 2. New mild cardiomegaly. 3. Small bilateral pleural effusions, right greater than left with compressive atelectasis in the dependent portions of both lungs. 4. Aortic Atherosclerosis (ICD10-I70.0). Electronically Signed   By: Lorriane Shire M.D.   On: 02/18/2019 17:33      Scheduled Meds: . amLODipine  5 mg Oral Daily  . aspirin EC  81 mg Oral QPC breakfast  . Chlorhexidine Gluconate Cloth  6 each Topical Daily  . docusate sodium  100 mg Oral QHS  . enoxaparin (LOVENOX) injection  30 mg Subcutaneous QHS  . polyethylene glycol  17 g Oral QPC  breakfast   Continuous Infusions: . sodium chloride 50 mL/hr at 02/19/19 2220  . cefTRIAXone (ROCEPHIN)  IV 1 g (02/19/19 2240)     LOS: 4 days      Time spent: 25 minutes   Dessa Phi, DO [facility] 02/20/2019, 10:29 AM   Available via Epic secure chat 7am-7pm After these hours, please refer to coverage provider listed on [email or web address]

## 2019-02-20 NOTE — Consult Note (Signed)
   [facility] CM Inpatient Consult   02/20/2019  KELITA BECHTEL 12/11/1931 [id number]   Patient screened for high risk score for unplanned readmission score  and/or for hospitalizations to check if potential [facility] services are needed.   In the [place]  Review of patient's medical record reveals patient is being assessed by Palliative Care for goals of care and noted with legal guardian.  Primary Care Provider is  Merrilee Seashore, MD  Plan: No [facility] Care Management needs assessed at current time.  Will fill through disposition is determined.  Please place a [facility] Care Management consult as appropriate and for questions contact:   Natividad Brood, RN BSN [facility] Liaison  [phone] business mobile phone Toll free office [phone]  Fax number: [phone] [email or web address]

## 2019-02-20 NOTE — Evaluation (Addendum)
Clinical/Bedside Swallow Evaluation Patient Details  Name: Shannon Blair MRN: [id number] Date of Birth: [birth year]  Today's Date: 02/20/2019 Time: SLP Start Time (ACUTE ONLY): 1031 SLP Stop Time (ACUTE ONLY): [id number] SLP Time Calculation (min) (ACUTE ONLY): 22 min  Past Medical History:  Past Medical History:  Diagnosis Date  . Anal cancer ([place]) 2004   Non-surgical mgt with chemo-radiation.  Dr Alen Blew follows.   . Anal fissure   . Colon, diverticulosis    Sigmoid. With associated stenosis.  Colonoscopy failed to intubate beyond stenosis in 08/2010.  rectal bx then negative for recurrent cancer.   . Dementia ([place])   . Hypertension   . OA (osteoarthritis)   . Squamous cell carcinoma    skin  . Varicose veins of left lower extremity with pain    Past Surgical History:  Past Surgical History:  Procedure Laterality Date  . ABDOMINAL HYSTERECTOMY    . APPENDECTOMY    . BLADDER REPAIR    . CATARACT EXTRACTION  05/2004    dr Ishmael Holter  . LUMBAR LAMINECTOMY/DECOMPRESSION MICRODISCECTOMY N/A 07/26/2016   Procedure: Central decompression, lumbar laminectomy L4-L5 and L5-S1, foraminotmy L5-S1 and L4-L5 bilateral;  Surgeon: Latanya Maudlin, MD;  Location: [facility];  Service: Orthopedics;  Laterality: N/A;  . NEPHRECTOMY  1970's   Rt. "Shrivelled up" ,  no cancer.   Marland Kitchen PACEMAKER IMPLANT N/A 02/17/2019   Procedure: PACEMAKER IMPLANT;  Surgeon: Constance Haw, MD;  Location: [facility];  Service: Cardiovascular;  Laterality: N/A;  . POLYPECTOMY     HPI:  Shannon Blair is a 85 y.o. female, w dementia, hypertension, hx of recurrent UTI, now presents with generalized weakness, and thought to have UTI.  Pt is unable to provide meaningful history due to dementia.  During hospitalization, patient was found to have complete heart block and patient was evaluated by cardiology/EP.  She underwent dual-chamber pacemaker placement on 11/30. Due to altered mental status, patient underwent CT head  which was unremarkable.  She was also found to be hypoxemic and underwent CTA chest which was negative for PE.  Palliative care has been following as well for goals of care conversation.  CT and CXR 12/1 with no acute findings. Pt known to this service from prior admissions.  In April of 2020 she was seen for a clinical swallow evaluation with recommendations to continue regular diet with nectar thick liquids.  In September of 2020 pt was seen for dysphagia with clinical evaluation and management and recommendations for mechanical soft solids with nectar thick liquids.  Assessment / Plan / Recommendation Clinical Impression  Pt presents with clinical indicators of pharyngeal dysphagia.  There was immediate, prolonged, wet cough on 2 of 3 trials of thin liquid.  Nectar thick liquid reduced or eliminated clinical s/s of aspiration.  There was very delayed, dry cough following some trials of NTL, but this is suspected to be continued response to thin liquids, rather than a new penetration or aspiration event with nectar thick drinks.  Pt was unable to siphon liquid through straw and all trials were administered by cup.  Pt was able to clear puree, soft solids, and regular solids from oral cavity; however, oral phase was significantly prolonged with regular textures. Recommend mechanical soft diet with nectar thick liquid by cup.  SLP to follow for diet tolerance and possible upgrade and/or instrumental evaluation if indicated and consistent with goals of care.  SLP Visit Diagnosis: Dysphagia, oropharyngeal phase (R13.12)    Aspiration Risk  Mild aspiration risk;Moderate aspiration risk    Diet Recommendation Dysphagia 3 (Mech soft);Nectar-thick liquid   Liquid Administration via: Cup;No straw Medication Administration: Crushed with puree Supervision: (1:1 assistance with feeding) Compensations: Slow rate;Small sips/bites Postural Changes: Seated upright at 90 degrees    Other  Recommendations Oral  Care Recommendations: Oral care BID Other Recommendations: Order thickener from pharmacy   Follow up Recommendations (ST to continue at next level of care)      Frequency and Duration min 2x/week  2 weeks       Prognosis Prognosis for Safe Diet Advancement: Fair Barriers to Reach Goals: Cognitive deficits      Swallow Study   General HPI: Shannon Blair is a 85 y.o. female, w dementia, hypertension, hx of recurrent UTI, now presents with generalized weakness, and thought to have UTI.  Pt is unable to provide meaningful history due to dementia.  During hospitalization, patient was found to have complete heart block and patient was evaluated by cardiology/EP.  She underwent dual-chamber pacemaker placement on 11/30. Due to altered mental status, patient underwent CT head which was unremarkable.  She was also found to be hypoxemic and underwent CTA chest which was negative for PE.  Palliative care has been following as well for goals of care conversation.  CT and CXR 12/1 with no acute findings. Type of Study: Bedside Swallow Evaluation Previous Swallow Assessment: none Diet Prior to this Study: Regular;Thin liquids(Kept NPO at present per chart review) Temperature Spikes Noted: No History of Recent Intubation: No Behavior/Cognition: Alert;Requires cueing Oral Cavity Assessment: Within Functional Limits Oral Care Completed by SLP: Yes Oral Cavity - Dentition: Dentures, top;Dentures, bottom Self-Feeding Abilities: Total assist Patient Positioning: Upright in bed Baseline Vocal Quality: Hoarse;Breathy;Low vocal intensity Volitional Cough: (Could not test) Volitional Swallow: Unable to elicit    Oral/Motor/Sensory Function Overall Oral Motor/Sensory Function: Mild impairment Facial ROM: (Could not test) Facial Symmetry: Within Functional Limits Lingual ROM: (Could not test) Lingual Symmetry: (Could not test) Lingual Strength: (Could not test) Velum: (Could not test) Mandible:  (Reduced)   Ice Chips Ice chips: Not tested   Thin Liquid Thin Liquid: Impaired Presentation: Cup Oral Phase Functional Implications: Right anterior spillage;Left anterior spillage Pharyngeal  Phase Impairments: Cough - Immediate    Nectar Thick Nectar Thick Liquid: Impaired Presentation: Cup Pharyngeal Phase Impairments: Cough - Delayed   Honey Thick Honey Thick Liquid: Not tested   Puree Puree: Impaired Presentation: Spoon   Solid     Solid: Impaired Oral Phase Functional Implications: Prolonged oral transit      Celedonio Savage, MA, [place] Office: [phone] 02/20/2019,11:10 AM

## 2019-02-20 NOTE — Progress Notes (Signed)
Palliative:  HPI: 85 y.o.femalewith past medical history of dementia, HTN, osteoarthritis, diverticulosis, h/o anal canceradmitted on11/29/2020with weakness and found to have complete heart block and UTI.Pacemaker placed 02/17/19. She remains pleasantly confused and has required safety sitter at bedside.She has been unresponsive x 2 days with unknown etiology. 12/3 a little more responsive although still very lethargic.  I met today with Shannon Blair. She has been reportedly more alert and awake today. During my assessment she greeted me "good morning" but did not open her eyes. I did get her to open eyes briefly and then squeeze my hand but after asking multiple times. No further verbal responses. I have noted her interactions with SLP and PT today as well.   I called her HCPOA, Shannon Blair. Shannon Blair understands that Shannon Blair is showing some improvements and I explained to her my interaction as well as that of SLP and PT. Shannon Blair tells me that she does not believe Shannon Blair needs thickened liquids "they gave her that before and she didn't need it or like it." I explained to her that Shannon Blair still has a long ways to Blair and is still very lethargic. I explained risk of aspiration and this could easily lead to more acute decline and EOL so it is better to be cautious. She agreed but I am not sure she understands the need for precaution. I explained that Shannon Blair has shown some improvement but her overall condition and prognosis remains tenuous. I explained that only time will tell what Shannon Blair is able to do and what her new baseline will be.   Shannon Blair continues to be very hopeful and has high expectations for improvement. All questions/concerns addressed. Emotional support provided.   Exam: More responsive today. Verbalizes "good morning" but no other verbal response. Opens eyes and squeezes hand (although after asking multiple times). No distress. Breathing regular, unlabored. Abd soft. Generalized weakness.   Plan: -  Continue to support.  - HCPOA hopeful for continued improvement.   25 min  Vinie Sill, NP Palliative Medicine Team Pager [phone] (Please see [email or web address] for schedule) Team Phone [phone]    Greater than 50%  of this time was spent counseling and coordinating care related to the above assessment and plan

## 2019-02-20 NOTE — Progress Notes (Signed)
Spoke with Pt's POA, Shannon Blair, she called by phone. Updated given, all questions answered. She expressed appreciations. She tried to speak with Pt on the phone.  Pt only responded to her a few words. Pt has been drowsy and weakness, responded to voice by opening her eyes and withdrew from local pain, did not followed commands.  Her vital signs stable, remained afebrile. EKG was ventricular paced and A-V paced with frequent PVC. HR 70s-80s. SPO2 96-100% on 4 LPM of NCL.  RN day shift reported Pt has been NPO, hight risk of aspiration due to drowsiness. She's getting IV NSS 50 ml/hr. Unable to record urine output due to PureWick leaking and soaking wet bed pad x1. Continue to monitor  Behr Cislo,RN

## 2019-02-21 LAB — BASIC METABOLIC PANEL
Anion gap: 9 (ref 5–15)
BUN: 39 mg/dL — ABNORMAL HIGH (ref 8–23)
CO2: 18 mmol/L — ABNORMAL LOW (ref 22–32)
Calcium: 9.8 mg/dL (ref 8.9–10.3)
Chloride: 119 mmol/L — ABNORMAL HIGH (ref 98–111)
Creatinine, Ser: 1.03 mg/dL — ABNORMAL HIGH (ref 0.44–1.00)
GFR calc Af Amer: 58 mL/min — ABNORMAL LOW (ref >60)
GFR calc non Af Amer: 50 mL/min — ABNORMAL LOW (ref >60)
Glucose, Bld: 81 mg/dL (ref 70–99)
Potassium: 4.3 mmol/L (ref 3.5–5.1)
Sodium: 146 mmol/L — ABNORMAL HIGH (ref 135–145)

## 2019-02-21 NOTE — Care Management Important Message (Signed)
Important Message  Patient Details  Name: WLADYSLAWA KERNER MRN: [id number] Date of Birth: 10/08/1931   Medicare Important Message Given:  Yes     Shelda Altes 02/21/2019, 11:44 AM

## 2019-02-21 NOTE — Progress Notes (Signed)
PROGRESS NOTE    Shannon Blair  [id number] DOB: [birth year] DOA: 02/16/2019 PCP: Merrilee Seashore, MD     Brief Narrative:  Shannon Blair is a 85 y.o.female,w dementia, hypertension, hx of recurrent UTI, now presents with generalized weakness, and thought to have UTI. Pt is unable to provide meaningful history due to dementia. During hospitalization, patient was found to have complete heart block and patient was evaluated by cardiology/EP.  She underwent dual-chamber pacemaker placement on 11/30. Due to altered mental status, patient underwent CT head which was unremarkable.  She was also found to be hypoxemic and underwent CTA chest which was negative for PE.  Palliative care has been following as well for goals of care conversation.  New events last 24 hours / Subjective: Oriented to self and hospital today. Remains intermittently confused. Asking to get up to use the bathroom (purewick remains in place)   Assessment & Plan:   Active Problems:   Complete heart block (HCC)   Pressure injury of skin   Goals of care, counseling/discussion   Palliative care encounter   Unresponsive    Complete heart block -Status post dual-chamber pacemaker placement 11/30 -Follow-up at device clinic/EP clinic for device care as an outpatient  Acute metabolic encephalopathy, lethargy  -CT head negative -Ammonia 24 -Palliative care following -Improved  Acute hypoxemic respiratory failure -CTA chest negative for PE -Repeat ABG with improvement -Now on room air   Acute lower UTI, present on admission.  History of recurrent UTI -Urine culture not available, previous urine culture from earlier this year was pansensitive E. coli -Rocephin x 5 days   CKD stage IIIb -Baseline creatinine 1.3 -Stable  Hypertension  -Continue Norvasc, hydralazine as needed  Dementia -STOP Aricept -STOP Namenda ->can induce bradycardia -STOP Effexor ->can worsen hypertension  Urinary  incontinence -STOP oxybutynin -> can affect conduction    In agreement with assessment of the pressure ulcer as below:  Pressure Injury 02/17/19 Sacrum Medial Stage I -  Intact skin with non-blanchable redness of a localized area usually over a bony prominence. (Active)  02/17/19 0300  Location: Sacrum  Location Orientation: Medial  Staging: Stage I -  Intact skin with non-blanchable redness of a localized area usually over a bony prominence.  Wound Description (Comments):   Present on Admission: Yes         DVT prophylaxis: Lovenox Code Status: DNR Family Communication: None at bedside, discussed with HCPOA over the phone regarding SNF recommendations  Disposition Plan: SNF placement recommended, SW consult    Consultants:   Cardiology  PCCM  EP  Palliative care   Antimicrobials:  Anti-infectives (From admission, onward)   Start     Dose/Rate Route Frequency Ordered Stop   02/17/19 2100  ceFAZolin (ANCEF) IVPB 1 g/50 mL premix     1 g 100 mL/hr over 30 Minutes Intravenous Every 6 hours 02/17/19 1816 02/18/19 1000   02/17/19 1800  cefTRIAXone (ROCEPHIN) 1 g in sodium chloride 0.9 % 100 mL IVPB     1 g 200 mL/hr over 30 Minutes Intravenous Every 24 hours 02/16/19 2335 02/22/19 2159   02/17/19 1115  gentamicin (GARAMYCIN) 80 mg in sodium chloride 0.9 % 500 mL irrigation     80 mg Irrigation On call 02/17/19 1105 02/17/19 1740   02/17/19 1115  ceFAZolin (ANCEF) IVPB 2g/100 mL premix     2 g 200 mL/hr over 30 Minutes Intravenous On call 02/17/19 1105 02/17/19 1658   02/16/19 1945  cefTRIAXone (ROCEPHIN) 1 g  in sodium chloride 0.9 % 100 mL IVPB     1 g 200 mL/hr over 30 Minutes Intravenous  Once 02/16/19 1932 02/16/19 2020       Objective: Vitals:   02/21/19 0400 02/21/19 0406 02/21/19 0407 02/21/19 0457  BP: (!) 155/67 (!) 148/61    Pulse: 82 80  (!) 101  Resp: (!) 21 (!) 22  20  Temp:      TempSrc:      SpO2: 93% 93%  95%  Weight:   61 kg   Height:         Intake/Output Summary (Last 24 hours) at 02/21/2019 1053 Last data filed at 02/21/2019 0400 Gross per 24 hour  Intake 1484.41 ml  Output 900 ml  Net 584.41 ml   Filed Weights   02/19/19 0600 02/20/19 0502 02/21/19 0407  Weight: 62 kg 61.8 kg 61 kg     Examination: General exam: Appears calm and comfortable  Respiratory system: Clear to auscultation. Respiratory effort normal. Cardiovascular system: S1 & S2 heard, Tachycardic, reg rhythm, rate 100. No pedal edema. Gastrointestinal system: Abdomen is nondistended, soft and nontender. Normal bowel sounds heard. Central nervous system: Alert and oriented x 2. Speech clear  Extremities: Symmetric in appearance bilaterally  Skin: No rashes, lesions or ulcers on exposed skin  Psychiatry: Some confusion intermittently      Data Reviewed: I have personally reviewed following labs and imaging studies  CBC: Recent Labs  Lab 02/16/19 1802 02/17/19 0327 02/18/19 0218 02/18/19 1437 02/18/19 2028 02/19/19 0303 02/20/19 0342  WBC 8.4 10.4 15.0*  --   --  15.0* 8.4  NEUTROABS 5.5  --  12.7*  --   --  12.6*  --   HGB 13.2 14.3 12.8 11.2* 11.6* 12.3 10.5*  HCT 38.7 41.8 38.2 33.0* 34.0* 37.3 32.1*  MCV 89.2 88.6 90.1  --   --  91.9 93.0  PLT 222 245 185  --   --  216 0000000   Basic Metabolic Panel: Recent Labs  Lab 02/17/19 0327 02/18/19 0218 02/18/19 1437 02/18/19 2028 02/19/19 0303 02/20/19 0342 02/21/19 0301  NA 138 142 140 139 142 144 146*  K 4.2 4.2 3.9 3.8 4.0 3.8 4.3  CL 108 110  --   --  112* 117* 119*  CO2 19* 18*  --   --  18* 19* 18*  GLUCOSE 110* 122*  --   --  121* 85 81  BUN 24* 26*  --   --  33* 44* 39*  CREATININE 1.18* 1.34*  --   --  1.38* 1.11* 1.03*  CALCIUM 10.1 10.0  --   --  9.9 9.6 9.8  MG  --  2.0  --   --  2.2  --   --   PHOS  --  3.4  --   --   --   --   --    GFR: Estimated Creatinine Clearance: 35.1 mL/min (A) (by C-G formula based on SCr of 1.03 mg/dL (H)). Liver Function Tests: Recent  Labs  Lab 02/17/19 0254 02/17/19 0327 02/18/19 0218 02/18/19 1000 02/19/19 0303  AST 38 47*  --  23 20  ALT 46* 42  --  24 16  ALKPHOS 170* 163*  --  113 106  BILITOT 0.7 1.2  --  0.6 0.7  PROT 7.2 6.2*  --  6.0* 5.9*  ALBUMIN 3.9 3.6 3.5 3.3* 3.1*   No results for input(s): LIPASE, AMYLASE in the last 168 hours.  Recent Labs  Lab 02/18/19 1000 02/19/19 1026  AMMONIA 13 24   Coagulation Profile: No results for input(s): INR, PROTIME in the last 168 hours. Cardiac Enzymes: No results for input(s): CKTOTAL, CKMB, CKMBINDEX, TROPONINI in the last 168 hours. BNP (last 3 results) No results for input(s): PROBNP in the last 8760 hours. HbA1C: No results for input(s): HGBA1C in the last 72 hours. CBG: Recent Labs  Lab 02/17/19 1131  GLUCAP 102*   Lipid Profile: No results for input(s): CHOL, HDL, LDLCALC, TRIG, CHOLHDL, LDLDIRECT in the last 72 hours. Thyroid Function Tests: No results for input(s): TSH, T4TOTAL, FREET4, T3FREE, THYROIDAB in the last 72 hours. Anemia Panel: No results for input(s): VITAMINB12, FOLATE, FERRITIN, TIBC, IRON, RETICCTPCT in the last 72 hours. Sepsis Labs: No results for input(s): PROCALCITON, LATICACIDVEN in the last 168 hours.  Recent Results (from the past 240 hour(s))  SARS CORONAVIRUS 2 (TAT 6-24 HRS) Nasopharyngeal Nasopharyngeal Swab     Status: None   Collection Time: 02/17/19 12:09 AM   Specimen: Nasopharyngeal Swab  Result Value Ref Range Status   SARS Coronavirus 2 NEGATIVE NEGATIVE Final    Comment: (NOTE) SARS-CoV-2 target nucleic acids are NOT DETECTED. The SARS-CoV-2 RNA is generally detectable in upper and lower respiratory specimens during the acute phase of infection. Negative results do not preclude SARS-CoV-2 infection, do not rule out co-infections with other pathogens, and should not be used as the sole basis for treatment or other patient management decisions. Negative results must be combined with clinical  observations, patient history, and epidemiological information. The expected result is Negative. Fact Sheet for Patients: SugarRoll.be Fact Sheet for Healthcare Providers: https://www.woods-mathews.com/ This test is not yet approved or cleared by the [place] FDA and  has been authorized for detection and/or diagnosis of SARS-CoV-2 by FDA under an Emergency Use Authorization (EUA). This EUA will remain  in effect (meaning this test can be used) for the duration of the COVID-19 declaration under Section 56 4(b)(1) of the Act, 21 U.S.C. section 360bbb-3(b)(1), unless the authorization is terminated or revoked sooner. Performed at [facility], [place] [address], [place], [place] [address]   MRSA PCR Screening     Status: None   Collection Time: 02/17/19  3:15 AM   Specimen: Nasal Mucosa; Nasopharyngeal  Result Value Ref Range Status   MRSA by PCR NEGATIVE NEGATIVE Final    Comment:        The GeneXpert MRSA Assay (FDA approved for NASAL specimens only), is one component of a comprehensive MRSA colonization surveillance program. It is not intended to diagnose MRSA infection nor to guide or monitor treatment for MRSA infections. Performed at [facility], [place] [address] [place], [place] [address]   Surgical PCR screen     Status: None   Collection Time: 02/17/19  3:15 AM   Specimen: Nasal Mucosa; Nasal Swab  Result Value Ref Range Status   MRSA, PCR NEGATIVE NEGATIVE Final   Staphylococcus aureus NEGATIVE NEGATIVE Final    Comment: (NOTE) The Xpert SA Assay (FDA approved for NASAL specimens in patients 59 years of age and older), is one component of a comprehensive surveillance program. It is not intended to diagnose infection nor to guide or monitor treatment. Performed at [facility], [place] [address] [place], [place] [address]       Radiology Studies: No results found.    Scheduled Meds: . amLODipine   5 mg Oral Daily  . aspirin EC  81  mg Oral QPC breakfast  . Chlorhexidine Gluconate Cloth  6 each Topical Daily  . docusate sodium  100 mg Oral QHS  . enoxaparin (LOVENOX) injection  30 mg Subcutaneous QHS  . polyethylene glycol  17 g Oral QPC breakfast   Continuous Infusions: . sodium chloride 50 mL/hr at 02/21/19 0359  . cefTRIAXone (ROCEPHIN)  IV 1 g (02/20/19 2211)     LOS: 5 days      Time spent: 25 minutes   Dessa Phi, DO [facility] 02/21/2019, 10:53 AM   Available via Epic secure chat 7am-7pm After these hours, please refer to coverage provider listed on [email or web address]

## 2019-02-21 NOTE — Progress Notes (Signed)
Physical Therapy Treatment Patient Details Name: Shannon Blair MRN: [id number] DOB: 1931/10/22 Today's Date: 02/21/2019    History of Present Illness 85yo female presenting with weakness and possible UTI, complete heart block identified via EKG. She received surgery for pacemaker placement 02/17/19. On 02/18/19 she became less responsive and with AMS, CT and CTA negative for acute changes that would explain this. PMH dementia, HTN, anal CA, lumbar laminectomy/decompression    PT Comments    Patient seen for mobility progression. Pt in bed upon arrival and agreeable to participate in therapy. Pt's nephew present. Pt is alert and participatory this session. Pt requires mod A +2 for bed mobility and max-total A +2 for functional transfer training. Pt demonstrates L inattention and R gaze preference. Continue to progress as tolerated with anticipated d/c to SNF for further skilled PT services.    Follow Up Recommendations  SNF;Supervision/Assistance - 24 hour     Equipment Recommendations  Other (comment)(TBD)    Recommendations for Other Services       Precautions / Restrictions Precautions Precautions: ICD/Pacemaker;Other (comment) Precaution Comments: dementia, standard pacemaker precautions    Mobility  Bed Mobility Overal bed mobility: Needs Assistance       Supine to sit: Mod assist;+2 for physical assistance     General bed mobility comments: cues for sequencing and use of rail   Transfers Overall transfer level: Needs assistance Equipment used: Rolling walker (2 wheeled);2 person hand held assist Transfers: Sit to/from Stand;Stand Pivot Transfers Sit to Stand: Max assist;+2 physical assistance Stand pivot transfers: Max assist;Total assist;+2 physical assistance       General transfer comment: pt stood X 2 trials from EOB; initial stand with RW and unable to achieve upright posture; second trial with pivot to recliner with +2 face to face using gait belt and bed  pad and bilat LE blocked; pt assisted as able   Ambulation/Gait                 Stairs             Wheelchair Mobility    Modified Rankin (Stroke Patients Only)       Balance Overall balance assessment: Needs assistance   Sitting balance-Leahy Scale: Fair       Standing balance-Leahy Scale: Poor                              Cognition Arousal/Alertness: Awake/alert Behavior During Therapy: Flat affect(laughing approriately at times) Overall Cognitive Status: History of cognitive impairments - at baseline Area of Impairment: Orientation;Attention;Memory;Following commands;Safety/judgement;Problem solving;Awareness                 Orientation Level: Place;Time;Situation;Person;Disoriented to Current Attention Level: Sustained Memory: Decreased short-term memory;Decreased recall of precautions Following Commands: Follows one step commands with increased time Safety/Judgement: Decreased awareness of safety;Decreased awareness of deficits   Problem Solving: Requires verbal cues;Requires tactile cues General Comments: L inattention noted      Exercises      General Comments        Pertinent Vitals/Pain Pain Assessment: No/denies pain    Home Living                      Prior Function            PT Goals (current goals can now be found in the care plan section) Progress towards PT goals: Progressing toward goals    Frequency  Min 3X/week      PT Plan Current plan remains appropriate    Co-evaluation PT/OT/SLP Co-Evaluation/Treatment: Yes Reason for Co-Treatment: For patient/therapist safety;To address functional/ADL transfers;Necessary to address cognition/behavior during functional activity PT goals addressed during session: Mobility/safety with mobility        AM-PAC PT "6 Clicks" Mobility   Outcome Measure  Help needed turning from your back to your side while in a flat bed without using bedrails?: A  Lot Help needed moving from lying on your back to sitting on the side of a flat bed without using bedrails?: A Lot Help needed moving to and from a bed to a chair (including a wheelchair)?: A Lot Help needed standing up from a chair using your arms (e.g., wheelchair or bedside chair)?: A Lot Help needed to walk in hospital room?: Total Help needed climbing 3-5 steps with a railing? : Total 6 Click Score: 10    End of Session Equipment Utilized During Treatment: Gait belt Activity Tolerance: Patient tolerated treatment well Patient left: with call bell/phone within reach;in chair;with chair alarm set;with family/visitor present Nurse Communication: Mobility status;Need for lift equipment PT Visit Diagnosis: Difficulty in walking, not elsewhere classified (R26.2);Muscle weakness (generalized) (M62.81);Other abnormalities of gait and mobility (R26.89)     Time: [id number] PT Time Calculation (min) (ACUTE ONLY): 34 min  Charges:  $Gait Training: 8-22 mins                     Earney Navy, PTA Acute Rehabilitation Services Pager: [phone] Office: [phone]     Darliss Cheney 02/21/2019, 4:33 PM

## 2019-02-21 NOTE — Progress Notes (Signed)
Palliative:  HPI: 85 y.o.femalewith past medical history of dementia, HTN, osteoarthritis, diverticulosis, h/o anal canceradmitted on11/29/2020with weakness and found to have complete heart block and UTI.Pacemaker placed 02/17/19. She remains pleasantly confused and has required safety sitter at bedside.She has been unresponsive x 2 days with unknown etiology. 12/3 a little more responsive although still very lethargic. 12/4 she is much more alert and mentation greatly improved but still struggling with adequate intake.   I met today with Shannon Blair again. She is MUCH more alert and interactive today. She smiles, laughs, jokes, and able to hold conversation with me. She knows that she is in the hospital but does not know any of her health conditions or that she received pacemaker. She is able to tell me about her HCPOA, Shannon Blair, and when I tell her I have been speaking with Shannon Blair and that I can pass on a message she advises me to tell Shannon Blair - "get your ass up here to see me" and then laughs. She is in good spirits today. I reassure her that her nephew is planning a visit this afternoon and she is satisfied with this. I also note that she ate very little breakfast this morning. I explained that it is important that she eat better for lunch so she can get stronger. Hopefully her diet can be upgraded not that her mentation is much improved.   I called and reported to [facility] the above. Shannon Blair understands that Shannon Blair is unlikely to be able to return home d/t increased care needs. We discussed SNF rehab and that this is most likely option. Shannon Blair is very concerned about Shannon Blair going to SNF and is worried that she is not ready for d/c. I explained that she is more alert and able to eat/drink now and that she can take pill antibiotics to treat UTI as indicated. Unfortunately there is very little to make [place] think she needs hospitalization much longer. I explain that the hope is she can participate with rehab to see if  she can gain strength while continuing to be monitored by staff at [facility]. Shannon Blair isn't thrilled about sending [name] to a facility but she knows that they cannot care for her at home. She will be anticipating contact from CSW/CMRN to follow up on placement.   Exam: More alert, oriented to person/place. No distress. Breathing regular, unlabored. Abd soft. Very weak.   Plan: - SNF rehab. Outpt palliative f/u recommended.  - Hopeful for upgraded diet recommendations with SLP f/u.  - HCPOA hopeful for continued improvement.  - No further palliative follow up at this time. Please call over the weekend for further palliative needs [phone].   25 min   Shannon Sill, NP Palliative Medicine Team Pager [phone] (Please see [email or web address] for schedule) Team Phone [phone]    Greater than 50%  of this time was spent counseling and coordinating care related to the above assessment and plan

## 2019-02-21 NOTE — Progress Notes (Addendum)
Pt's more alert and cooperated, fatigue and weak, followed commands, confused, oriented to self and place, disoriented to time and situations. Required maximum assisted. Denied pain. High risk of aspiration. RN day shift reported Pt's daughter visited today and did not follow SLP instructions for Dysphagia diet with nectar thick and no strew. Even though Medical illustrator already explained,  Pt's daughter gave thin liquid to Pt and Pt's shocked afterward.  HCPOA , Rigoberto Noel called Hilaria Ota, RN day shift, requested if Pt's daughter gives staff any troubles, please call HCPOA,Joan Rice.  Pt's vital signs stable, remained afebrile, SPO2 99%, titrated O2 NCL to 2 lpm. EKG showed V- paced/AV- paced on demand with occasional multifocal PVC on monitor, HR 70s-90s. BP 157/75-166/69 mmHg.  No acute distress noted. Continue to monitor.   Kennyth Lose, RN

## 2019-02-21 NOTE — TOC Progression Note (Addendum)
Transition of Care [facility]) - Progression Note    Patient Details  Name: Shannon Blair MRN: [id number] Date of Birth: 01/09/1932  Transition of Care [facility]) CM/SW [place], [place] Phone Number: 02/21/2019, 1:57 PM  Clinical Narrative:     CSW spoke with patient's HCPOA, Joann, she was in agreement with ST rehab at [facility]. Preference is for [facility].   [facility] confirmed bed offer. SNF will start authorization today.   Thurmond Butts, MSW, [facility] Clinical Social Worker [phone]   Expected Discharge Plan: [place] Barriers to Discharge: Continued Medical Work up  Expected Discharge Plan and Services Expected Discharge Plan: [place] In-house Referral: NA   Post Acute Care Choice: [place] arrangements for the past 2 months: [place]: [place]         Social Determinants of Health (SDOH) Interventions    Readmission Risk Interventions No flowsheet data found.

## 2019-02-21 NOTE — Evaluation (Signed)
Occupational Therapy Evaluation Patient Details Name: Shannon Blair MRN: [id number] DOB: Dec 02, 1931 Today's Date: 02/21/2019    History of Present Illness 85yo female presenting with weakness and possible UTI, complete heart block identified via EKG. She received surgery for pacemaker placement 02/17/19. On 02/18/19 she became less responsive and with AMS, CT and CTA negative for acute changes that would explain this. PMH dementia, HTN, anal CA, lumbar laminectomy/decompression   Clinical Impression   This 85 yo female admitted with above presents to acute OT with decreased attention to left, decreased vision to left, decreased sitting and standing balance all affecting her safety and independence with basic ADLs (as of 1 week ago she was able to ambulate with RW, toilet herself, and feed herself and now she is total A +2 for transfers and toileting and total/max A for self feeding (does better with holding cup for drinking than utensils for eating). She will benefit from acute OT with follow up at SNF.    Follow Up Recommendations  SNF;Supervision/Assistance - 24 hour    Equipment Recommendations  Other (comment)(TBD at next venue)       Precautions / Restrictions Precautions Precautions: ICD/Pacemaker;Other (comment) Precaution Comments: dementia, standard pacemaker precautions Restrictions Weight Bearing Restrictions: No      Mobility Bed Mobility Overal bed mobility: Needs Assistance Bed Mobility: Rolling;Supine to Sit Rolling: Total assist   Supine to sit: Mod assist;+2 for physical assistance     General bed mobility comments: cues for sequencing and use of rail   Transfers Overall transfer level: Needs assistance Equipment used: Rolling walker (2 wheeled);2 person hand held assist Transfers: Sit to/from [employer] Transfers Sit to Stand: Max assist;+2 physical assistance Stand pivot transfers: Max assist;Total assist;+2 physical assistance       General  transfer comment: pt stood X 2 trials from EOB; initial stand with RW and unable to achieve upright posture; second trial with pivot to recliner with +2 face to face using gait belt and bed pad and bilat LE blocked; pt assisted as able     Balance Overall balance assessment: Needs assistance   Sitting balance-Leahy Scale: Poor   Postural control: Posterior lean(intermittently with tactile cues to come forward) Standing balance support: Bilateral upper extremity supported Standing balance-Leahy Scale: Zero                             ADL either performed or assessed with clinical judgement   ADL Overall ADL's : Needs assistance/impaired Eating/Feeding: Maximal assistance;Sitting;Bed level   Grooming: Maximal assistance;Sitting   Upper Body Bathing: Maximal assistance;Sitting   Lower Body Bathing: Total assistance Lower Body Bathing Details (indicate cue type and reason): total A+2 sit>partial stand Upper Body Dressing : Total assistance;Sitting   Lower Body Dressing: Total assistance Lower Body Dressing Details (indicate cue type and reason): total A+2 sit>partial stand Toilet Transfer: Total assistance;+2 for physical assistance;Stand-pivot Toilet Transfer Details (indicate cue type and reason): bed>recliner on pt's right Toileting- Clothing Manipulation and Hygiene: Total assistance Toileting - Clothing Manipulation Details (indicate cue type and reason): total A+2 sit>partial stand             Vision Baseline Vision/History: Wears glasses Wears Glasses: Reading only Patient Visual Report: No change from baseline Vision Assessment?: Yes Additional Comments: Patient only on 1 occassion was able to reach and touch an orange pen I was holding. She said she could see it, would reach for it but would miss. Also she  tended to look to midline and right (when she did look to right of mid line it was with increased cues)            Pertinent Vitals/Pain Pain  Assessment: No/denies pain     Hand Dominance Right   Extremity/Trunk Assessment Upper Extremity Assessment Upper Extremity Assessment: LUE deficits/detail LUE Deficits / Details: Not using as spontaneously as she does her RUE; when asked to move her left hand she would at least 50% of time move her RUE first, decreased attention to left UE           Communication Communication Communication: No difficulties   Cognition Arousal/Alertness: Awake/alert Behavior During Therapy: Flat affect(laughing appropriately at times) Overall Cognitive Status: History of cognitive impairments - at baseline Area of Impairment: Orientation;Attention;Memory;Following commands;Safety/judgement;Problem solving;Awareness                 Orientation Level: Place;Time;Situation;Disoriented to Current Attention Level: Sustained Memory: Decreased short-term memory;Decreased recall of precautions Following Commands: Follows one step commands with increased time Safety/Judgement: Decreased awareness of safety;Decreased awareness of deficits Awareness: Intellectual Problem Solving: Requires verbal cues;Requires tactile cues General Comments: L inattention noted              Home Living Family/patient expects to be discharged to:: Skilled nursing facility                                        Prior Functioning/Environment Level of Independence: Needs assistance  Gait / Transfers Assistance Needed: Per nephew in room, pt was ambulatory with RW about a week pta ADL's / Homemaking Assistance Needed: Per nephew in room, pt needed A with bathing and dressing but was able to take herself to bathroom up until a week before admission            OT Problem List: Decreased strength;Decreased range of motion;Impaired balance (sitting and/or standing);Impaired vision/perception;Impaired UE functional use;Impaired sensation      OT Treatment/Interventions: Self-care/ADL training;DME  and/or AE instruction;Patient/family education;Balance training;Visual/perceptual remediation/compensation    OT Goals(Current goals can be found in the care plan section) Acute Rehab OT Goals Patient Stated Goal: nephew would like for her to be able to come home but knows she needs more rehab first OT Goal Formulation: With family Time For Goal Achievement: 03/07/19 Potential to Achieve Goals: Fair  OT Frequency: Min 2X/week   Barriers to D/C: Decreased caregiver support          Co-evaluation PT/OT/SLP Co-Evaluation/Treatment: Yes Reason for Co-Treatment: For patient/therapist safety;To address functional/ADL transfers;Necessary to address cognition/behavior during functional activity PT goals addressed during session: Mobility/safety with mobility OT goals addressed during session: ADL's and self-care;Strengthening/ROM      AM-PAC OT "6 Clicks" Daily Activity     Outcome Measure Help from another person eating meals?: A Lot Help from another person taking care of personal grooming?: A Lot Help from another person toileting, which includes using toliet, bedpan, or urinal?: Total Help from another person bathing (including washing, rinsing, drying)?: A Lot Help from another person to put on and taking off regular upper body clothing?: Total Help from another person to put on and taking off regular lower body clothing?: Total 6 Click Score: 9   End of Session Equipment Utilized During Treatment: Gait belt;Rolling walker Nurse Communication: Mobility status;Need for lift equipment(maxi move wtih pad under patient in recliner)  Activity Tolerance: Patient tolerated treatment  well Patient left: in chair;with call bell/phone within reach;with chair alarm set  OT Visit Diagnosis: Unsteadiness on feet (R26.81);Other abnormalities of gait and mobility (R26.89);Muscle weakness (generalized) (M62.81);Low vision, both eyes (H54.2);Other symptoms and signs involving the nervous system  (R29.898);Hemiplegia and hemiparesis Hemiplegia - Right/Left: Left Hemiplegia - dominant/non-dominant: Non-Dominant Hemiplegia - caused by: Unspecified                Time: [id number] OT Time Calculation (min): 34 min Charges:  OT General Charges $OT Visit: 1 Visit OT Evaluation $OT Eval Moderate Complexity: 1 Mod  [place] [address], OTR/L Acute [employer] Pager [phone] Office [phone]     Almon Register 02/21/2019, 8:16 PM

## 2019-02-21 NOTE — Progress Notes (Signed)
Weaned off O2 NCL, room air SPO2 94-95%. Pt tolerated well, no acute respiratory distress noted. BP 168/71 mmHg, HR  80s-100, Hydralazine 20 mg IV PRN given. Followed up BP 148/61 mmHg after giving Hydralazine. Continue to monitor.  Shana Younge [id number]

## 2019-02-22 LAB — BASIC METABOLIC PANEL
Anion gap: 9 (ref 5–15)
BUN: 31 mg/dL — ABNORMAL HIGH (ref 8–23)
CO2: 17 mmol/L — ABNORMAL LOW (ref 22–32)
Calcium: 9.6 mg/dL (ref 8.9–10.3)
Chloride: 120 mmol/L — ABNORMAL HIGH (ref 98–111)
Creatinine, Ser: 0.87 mg/dL (ref 0.44–1.00)
GFR calc Af Amer: >60 mL/min (ref >60)
GFR calc non Af Amer: >60 mL/min (ref >60)
Glucose, Bld: 92 mg/dL (ref 70–99)
Potassium: 4.7 mmol/L (ref 3.5–5.1)
Sodium: 146 mmol/L — ABNORMAL HIGH (ref 135–145)

## 2019-02-22 MED ORDER — AMLODIPINE BESYLATE 10 MG PO TABS
10.0000 mg | ORAL_TABLET | Freq: Every day | ORAL | Status: DC
  Administered 2019-02-23 – 2019-02-25 (×3): 10 mg via ORAL
  Filled 2019-02-22 (×3): qty 1

## 2019-02-22 NOTE — NC FL2 (Addendum)
MEDICAID FL2 LEVEL OF CARE SCREENING TOOL     IDENTIFICATION  Patient Name: Shannon Blair Birthdate: June 04, 1931 Sex: female Admission Date (Current Location): 02/16/2019  [facility] and [place] Number:  Herbalist and Address:  [facility]. [facility], [place] [address] [place], [place] [address]      Provider Number: [id number]  Attending Physician Name and Address:  Dessa Phi, DO  Relative Name and Phone Number:  Charise Killian Legal Guardian   [phone]    Current Level of Care: Hospital Recommended Level of Care: [place] Prior Approval Number:    Date Approved/Denied:   PASRR Number: [id number]  Discharge Plan: SNF    Current Diagnoses: Patient Active Problem List   Diagnosis Date Noted  . Unresponsive   . Pressure injury of skin 02/18/2019  . Goals of care, counseling/discussion   . Palliative care encounter   . Complete heart block ([place]) 02/16/2019  . Hypertensive urgency   . Stage 3 chronic kidney disease   . Weakness generalized 11/20/2018  . Acute encephalopathy 08/06/2018  . Pyelonephritis 08/06/2018  . ARF (acute renal failure) ([place]) 08/06/2018  . Hydronephrosis 08/06/2018  . UTI (urinary tract infection) 07/06/2018  . Acute metabolic encephalopathy [id number]  . AKI (acute kidney injury) ([place]) 07/06/2018  . Dementia ([place])   . Spinal stenosis, lumbar region with neurogenic claudication 07/26/2016  . Pseudoaphakia 11/13/2011  . Nonspecific (abnormal) findings on radiological and other examination of gastrointestinal tract 06/26/2011  . Colitis 06/25/2011  . Rectal bleeding 06/25/2011  . History of rectal or anal cancer 06/25/2011  . HTN (hypertension) 06/25/2011    Orientation RESPIRATION BLADDER Height & Weight     Self  Normal External catheter, Incontinent(placed 11/30) Weight: 134 lb 7.7 oz (61 kg) Height:  5\' 4"  (162.6 cm)  BEHAVIORAL SYMPTOMS/MOOD NEUROLOGICAL BOWEL NUTRITION STATUS     Continent Diet(heart healthy)  AMBULATORY STATUS COMMUNICATION OF NEEDS Skin   Limited Assist Verbally PU Stage and Appropriate Care(Closed incision, left chest, guaze dressing, change PRN) PU Stage 1 Dressing: (Sacrum, foam dressing, change every 3 days)                     Personal Care Assistance Level of Assistance  Bathing, Feeding, Dressing Bathing Assistance: Limited assistance Feeding assistance: Independent Dressing Assistance: Limited assistance     Functional Limitations Info  Sight, Hearing, Speech Sight Info: Adequate Hearing Info: Adequate Speech Info: Adequate    SPECIAL CARE FACTORS FREQUENCY  PT (By licensed PT), OT (By licensed OT)     PT Frequency: 5x OT Frequency: 5x            Contractures Contractures Info: Not present    Additional Factors Info  Code Status, Allergies Code Status Info: DNR Allergies Info: Tape, Tilapia (Fish Allergy)           Current Medications (02/22/2019):  This is the current hospital active medication list Current Facility-Administered Medications  Medication Dose Route Frequency Provider Last Rate Last Dose  . acetaminophen (TYLENOL) tablet 650 mg  650 mg Oral Q6H PRN Jani Gravel, MD       Or  . acetaminophen (TYLENOL) suppository 650 mg  650 mg Rectal Q6H PRN Jani Gravel, MD      . acetaminophen (TYLENOL) tablet 325-650 mg  325-650 mg Oral Q4H PRN Camnitz, Will Hassell Done, MD      . amLODipine (NORVASC) tablet 5 mg  5 mg Oral Daily Marylyn Ishihara, Tyrone A, DO  5 mg at 02/21/19 1226  . aspirin EC tablet 81 mg  81 mg Oral QPC breakfast Jani Gravel, MD   81 mg at 02/21/19 1226  . cefTRIAXone (ROCEPHIN) 1 g in sodium chloride 0.9 % 100 mL IVPB  1 g Intravenous Q24H Dessa Phi, DO 200 mL/hr at 02/21/19 2237 1 g at 02/21/19 2237  . Chlorhexidine Gluconate Cloth 2 % PADS 6 each  6 each Topical Daily Jani Gravel, MD   6 each at 02/21/19 1226  . docusate sodium (COLACE) capsule 100 mg  100 mg Oral QHS Jani Gravel, MD   100 mg at  02/21/19 2234  . enoxaparin (LOVENOX) injection 30 mg  30 mg Subcutaneous QHS Jani Gravel, MD   30 mg at 02/21/19 2234  . hydrALAZINE (APRESOLINE) injection 20 mg  20 mg Intravenous Q6H PRN Marylyn Ishihara, Tyrone A, DO   20 mg at 02/21/19 2256  . ondansetron (ZOFRAN) injection 4 mg  4 mg Intravenous Q6H PRN Camnitz, Will Hassell Done, MD      . polyethylene glycol (MIRALAX / GLYCOLAX) packet 17 g  17 g Oral QPC breakfast Jani Gravel, MD   17 g at 02/21/19 1226  . Resource ThickenUp Clear   Oral PRN Dessa Phi, DO         Discharge Medications: Please see discharge summary for a list of discharge medications.  Relevant Imaging Results:  Relevant Lab Results:   Additional Information [id number]  Palliative care to follow at [place], LCSWA

## 2019-02-22 NOTE — Progress Notes (Signed)
PROGRESS NOTE    GERTRUE SCHOONMAKER  [id number] DOB: 1931/09/18 DOA: 02/16/2019 PCP: Merrilee Seashore, MD     Brief Narrative:  Shannon Blair is a 85 y.o.female,w dementia, hypertension, hx of recurrent UTI, now presents with generalized weakness, and thought to have UTI. Pt is unable to provide meaningful history due to dementia. During hospitalization, patient was found to have complete heart block and patient was evaluated by cardiology/EP.  She underwent dual-chamber pacemaker placement on 11/30. Due to altered mental status, patient underwent CT head which was unremarkable.  She was also found to be hypoxemic and underwent CTA chest which was negative for PE.  Palliative care has been following as well for goals of care conversation.  New events last 24 hours / Subjective: Sitting in bed, eating breakfast.  No new complaints today  Assessment & Plan:   Principal Problem:   Complete heart block (HCC) Active Problems:   Pressure injury of skin   Goals of care, counseling/discussion   Palliative care encounter   Unresponsive    Complete heart block -Status post dual-chamber pacemaker placement 11/30 -Follow-up at device clinic/EP clinic for device care as an outpatient  Acute metabolic encephalopathy, lethargy  -CT head negative -Ammonia 24 -Palliative care following -Improved  Acute hypoxemic respiratory failure -CTA chest negative for PE -Repeat ABG with improvement -Now on room air   Acute lower UTI, present on admission.  History of recurrent UTI -Urine culture not available, previous urine culture from earlier this year was pansensitive E. coli -Rocephin x 5 days   CKD stage IIIb -Baseline creatinine 1.3 -Stable  Hypertension  -Continue Norvasc, hydralazine as needed -Increase norvasc dose today   Dementia -STOP Aricept -STOP Namenda ->can induce bradycardia -STOP Effexor ->can worsen hypertension  Urinary incontinence -STOP  oxybutynin -> can affect conduction    In agreement with assessment of the pressure ulcer as below:  Pressure Injury 02/17/19 Sacrum Medial Stage I -  Intact skin with non-blanchable redness of a localized area usually over a bony prominence. (Active)  02/17/19 0300  Location: Sacrum  Location Orientation: Medial  Staging: Stage I -  Intact skin with non-blanchable redness of a localized area usually over a bony prominence.  Wound Description (Comments):   Present on Admission: Yes         DVT prophylaxis: Lovenox Code Status: DNR Family Communication: None at bedside Disposition Plan: Awaiting SNF placement   Consultants:   Cardiology  PCCM  EP  Palliative care   Antimicrobials:  Anti-infectives (From admission, onward)   Start     Dose/Rate Route Frequency Ordered Stop   02/17/19 2100  ceFAZolin (ANCEF) IVPB 1 g/50 mL premix     1 g 100 mL/hr over 30 Minutes Intravenous Every 6 hours 02/17/19 1816 02/18/19 1000   02/17/19 1800  cefTRIAXone (ROCEPHIN) 1 g in sodium chloride 0.9 % 100 mL IVPB     1 g 200 mL/hr over 30 Minutes Intravenous Every 24 hours 02/16/19 2335 02/22/19 2159   02/17/19 1115  gentamicin (GARAMYCIN) 80 mg in sodium chloride 0.9 % 500 mL irrigation     80 mg Irrigation On call 02/17/19 1105 02/17/19 1740   02/17/19 1115  ceFAZolin (ANCEF) IVPB 2g/100 mL premix     2 g 200 mL/hr over 30 Minutes Intravenous On call 02/17/19 1105 02/17/19 1658   02/16/19 1945  cefTRIAXone (ROCEPHIN) 1 g in sodium chloride 0.9 % 100 mL IVPB     1 g 200 mL/hr over 30  Minutes Intravenous  Once 02/16/19 1932 02/16/19 2020       Objective: Vitals:   02/21/19 1624 02/21/19 1957 02/22/19 0616 02/22/19 0816  BP: (!) 153/68 (!) 169/85 (!) 153/77 (!) 172/73  Pulse: 82 81 79 81  Resp: 14 17 20  (!) 22  Temp: 98.4 F (36.9 C) 98.1 F (36.7 C) 97.6 F (36.4 C) 98.2 F (36.8 C)  TempSrc: Oral Oral Oral Oral  SpO2: 96% 99% 97% 97%  Weight:      Height:       No  intake or output data in the 24 hours ending 02/22/19 1025 Filed Weights   02/19/19 0600 02/20/19 0502 02/21/19 0407  Weight: 62 kg 61.8 kg 61 kg     Examination: General exam: Appears calm and comfortable  Respiratory system: Clear to auscultation. Respiratory effort normal. Cardiovascular system: S1 & S2 heard, RRR. No pedal edema. Gastrointestinal system: Abdomen is nondistended, soft and nontender. Normal bowel sounds heard. Central nervous system: Alert, appropriate, no focal deficit  Extremities: Symmetric in appearance bilaterally  Skin: No rashes, lesions or ulcers on exposed skin  Psychiatry: Stable   Data Reviewed: I have personally reviewed following labs and imaging studies  CBC: Recent Labs  Lab 02/16/19 1802 02/17/19 0327 02/18/19 0218 02/18/19 1437 02/18/19 2028 02/19/19 0303 02/20/19 0342  WBC 8.4 10.4 15.0*  --   --  15.0* 8.4  NEUTROABS 5.5  --  12.7*  --   --  12.6*  --   HGB 13.2 14.3 12.8 11.2* 11.6* 12.3 10.5*  HCT 38.7 41.8 38.2 33.0* 34.0* 37.3 32.1*  MCV 89.2 88.6 90.1  --   --  91.9 93.0  PLT 222 245 185  --   --  216 0000000   Basic Metabolic Panel: Recent Labs  Lab 02/18/19 0218  02/18/19 2028 02/19/19 0303 02/20/19 0342 02/21/19 0301 02/22/19 0258  NA 142   < > 139 142 144 146* 146*  K 4.2   < > 3.8 4.0 3.8 4.3 4.7  CL 110  --   --  112* 117* 119* 120*  CO2 18*  --   --  18* 19* 18* 17*  GLUCOSE 122*  --   --  121* 85 81 92  BUN 26*  --   --  33* 44* 39* 31*  CREATININE 1.34*  --   --  1.38* 1.11* 1.03* 0.87  CALCIUM 10.0  --   --  9.9 9.6 9.8 9.6  MG 2.0  --   --  2.2  --   --   --   PHOS 3.4  --   --   --   --   --   --    < > = values in this interval not displayed.   GFR: Estimated Creatinine Clearance: 41.6 mL/min (by C-G formula based on SCr of 0.87 mg/dL). Liver Function Tests: Recent Labs  Lab 02/17/19 0254 02/17/19 0327 02/18/19 0218 02/18/19 1000 02/19/19 0303  AST 38 47*  --  23 20  ALT 46* 42  --  24 16  ALKPHOS  170* 163*  --  113 106  BILITOT 0.7 1.2  --  0.6 0.7  PROT 7.2 6.2*  --  6.0* 5.9*  ALBUMIN 3.9 3.6 3.5 3.3* 3.1*   No results for input(s): LIPASE, AMYLASE in the last 168 hours. Recent Labs  Lab 02/18/19 1000 02/19/19 1026  AMMONIA 13 24   Coagulation Profile: No results for input(s): INR, PROTIME in the last  168 hours. Cardiac Enzymes: No results for input(s): CKTOTAL, CKMB, CKMBINDEX, TROPONINI in the last 168 hours. BNP (last 3 results) No results for input(s): PROBNP in the last 8760 hours. HbA1C: No results for input(s): HGBA1C in the last 72 hours. CBG: Recent Labs  Lab 02/17/19 1131  GLUCAP 102*   Lipid Profile: No results for input(s): CHOL, HDL, LDLCALC, TRIG, CHOLHDL, LDLDIRECT in the last 72 hours. Thyroid Function Tests: No results for input(s): TSH, T4TOTAL, FREET4, T3FREE, THYROIDAB in the last 72 hours. Anemia Panel: No results for input(s): VITAMINB12, FOLATE, FERRITIN, TIBC, IRON, RETICCTPCT in the last 72 hours. Sepsis Labs: No results for input(s): PROCALCITON, LATICACIDVEN in the last 168 hours.  Recent Results (from the past 240 hour(s))  SARS CORONAVIRUS 2 (TAT 6-24 HRS) Nasopharyngeal Nasopharyngeal Swab     Status: None   Collection Time: 02/17/19 12:09 AM   Specimen: Nasopharyngeal Swab  Result Value Ref Range Status   SARS Coronavirus 2 NEGATIVE NEGATIVE Final    Comment: (NOTE) SARS-CoV-2 target nucleic acids are NOT DETECTED. The SARS-CoV-2 RNA is generally detectable in upper and lower respiratory specimens during the acute phase of infection. Negative results do not preclude SARS-CoV-2 infection, do not rule out co-infections with other pathogens, and should not be used as the sole basis for treatment or other patient management decisions. Negative results must be combined with clinical observations, patient history, and epidemiological information. The expected result is Negative. Fact Sheet for Patients:  SugarRoll.be Fact Sheet for Healthcare Providers: https://www.woods-mathews.com/ This test is not yet approved or cleared by the [place] FDA and  has been authorized for detection and/or diagnosis of SARS-CoV-2 by FDA under an Emergency Use Authorization (EUA). This EUA will remain  in effect (meaning this test can be used) for the duration of the COVID-19 declaration under Section 56 4(b)(1) of the Act, 21 U.S.C. section 360bbb-3(b)(1), unless the authorization is terminated or revoked sooner. Performed at [facility], [place] [address], [place], [place] [address]   MRSA PCR Screening     Status: None   Collection Time: 02/17/19  3:15 AM   Specimen: Nasal Mucosa; Nasopharyngeal  Result Value Ref Range Status   MRSA by PCR NEGATIVE NEGATIVE Final    Comment:        The GeneXpert MRSA Assay (FDA approved for NASAL specimens only), is one component of a comprehensive MRSA colonization surveillance program. It is not intended to diagnose MRSA infection nor to guide or monitor treatment for MRSA infections. Performed at [facility], [place] [address] [place], [place] [address]   Surgical PCR screen     Status: None   Collection Time: 02/17/19  3:15 AM   Specimen: Nasal Mucosa; Nasal Swab  Result Value Ref Range Status   MRSA, PCR NEGATIVE NEGATIVE Final   Staphylococcus aureus NEGATIVE NEGATIVE Final    Comment: (NOTE) The Xpert SA Assay (FDA approved for NASAL specimens in patients 11 years of age and older), is one component of a comprehensive surveillance program. It is not intended to diagnose infection nor to guide or monitor treatment. Performed at [facility], [place] [address] [place], [place] [address]       Radiology Studies: No results found.    Scheduled Meds: . amLODipine  5 mg Oral Daily  . aspirin EC  81 mg Oral QPC breakfast  . Chlorhexidine Gluconate Cloth  6 each Topical Daily  .  docusate sodium  100 mg Oral QHS  .  enoxaparin (LOVENOX) injection  30 mg Subcutaneous QHS  . polyethylene glycol  17 g Oral QPC breakfast   Continuous Infusions: . cefTRIAXone (ROCEPHIN)  IV 1 g (02/21/19 2237)     LOS: 6 days      Time spent: 25 minutes   Dessa Phi, DO [facility] 02/22/2019, 10:25 AM   Available via Epic secure chat 7am-7pm After these hours, please refer to coverage provider listed on [email or web address]

## 2019-02-23 LAB — BASIC METABOLIC PANEL WITH GFR
Anion gap: 10 (ref 5–15)
BUN: 27 mg/dL — ABNORMAL HIGH (ref 8–23)
CO2: 21 mmol/L — ABNORMAL LOW (ref 22–32)
Calcium: 10 mg/dL (ref 8.9–10.3)
Chloride: 113 mmol/L — ABNORMAL HIGH (ref 98–111)
Creatinine, Ser: 0.94 mg/dL (ref 0.44–1.00)
GFR calc Af Amer: >60 mL/min
GFR calc non Af Amer: 56 mL/min — ABNORMAL LOW
Glucose, Bld: 102 mg/dL — ABNORMAL HIGH (ref 70–99)
Potassium: 3.8 mmol/L (ref 3.5–5.1)
Sodium: 144 mmol/L (ref 135–145)

## 2019-02-23 MED ORDER — LISINOPRIL 10 MG PO TABS
10.0000 mg | ORAL_TABLET | Freq: Every day | ORAL | Status: DC
  Administered 2019-02-23 – 2019-02-25 (×3): 10 mg via ORAL
  Filled 2019-02-23 (×3): qty 1

## 2019-02-23 NOTE — Progress Notes (Signed)
PROGRESS NOTE    Shannon Blair  [id number] DOB: 10-Mar-1932 DOA: 02/16/2019 PCP: Merrilee Seashore, MD     Brief Narrative:  Shannon Blair is a 85 y.o.female,w dementia, hypertension, hx of recurrent UTI, now presents with generalized weakness, and thought to have UTI. Pt is unable to provide meaningful history due to dementia. During hospitalization, patient was found to have complete heart block and patient was evaluated by cardiology/EP.  She underwent dual-chamber pacemaker placement on 11/30. Due to altered mental status, patient underwent CT head which was unremarkable.  She was also found to be hypoxemic and underwent CTA chest which was negative for PE.  Palliative care has been following as well for goals of care conversation.  Her mentation has improved.  New events last 24 hours / Subjective: No acute events, blood pressure elevated overnight.  Sitting in bed and eating breakfast without any complaints.  Assessment & Plan:   Principal Problem:   Complete heart block (HCC) Active Problems:   Pressure injury of skin   Goals of care, counseling/discussion   Palliative care encounter   Unresponsive    Complete heart block -Status post dual-chamber pacemaker placement 11/30 -Follow-up at device clinic/EP clinic for device care as an outpatient  Acute metabolic encephalopathy, lethargy  -CT head negative -Ammonia 24 -Palliative care following -Improved  Acute hypoxemic respiratory failure -CTA chest negative for PE -Repeat ABG with improvement -Now on room air   Acute lower UTI, present on admission.  History of recurrent UTI -Urine culture not available, previous urine culture from earlier this year was pansensitive E. coli -Rocephin x 5 days   CKD stage IIIb -Baseline creatinine 1.3 -Stable  Hypertension  -Continue Norvasc, hydralazine as needed -Add lisinopril today  Dementia -STOP Aricept -STOP Namenda ->can induce bradycardia  -STOP Effexor ->can worsen hypertension  Urinary incontinence -STOP oxybutynin -> can affect conduction    In agreement with assessment of the pressure ulcer as below:  Pressure Injury 02/17/19 Sacrum Medial Stage I -  Intact skin with non-blanchable redness of a localized area usually over a bony prominence. (Active)  02/17/19 0300  Location: Sacrum  Location Orientation: Medial  Staging: Stage I -  Intact skin with non-blanchable redness of a localized area usually over a bony prominence.  Wound Description (Comments):   Present on Admission: Yes         DVT prophylaxis: Lovenox Code Status: DNR Family Communication: None at bedside Disposition Plan: Awaiting SNF placement, insurance Auth pending   Consultants:   Cardiology  PCCM  EP  Palliative care   Antimicrobials:  Anti-infectives (From admission, onward)   Start     Dose/Rate Route Frequency Ordered Stop   02/17/19 2100  ceFAZolin (ANCEF) IVPB 1 g/50 mL premix     1 g 100 mL/hr over 30 Minutes Intravenous Every 6 hours 02/17/19 1816 02/18/19 1000   02/17/19 1800  cefTRIAXone (ROCEPHIN) 1 g in sodium chloride 0.9 % 100 mL IVPB     1 g 200 mL/hr over 30 Minutes Intravenous Every 24 hours 02/16/19 2335 02/22/19 2159   02/17/19 1115  gentamicin (GARAMYCIN) 80 mg in sodium chloride 0.9 % 500 mL irrigation     80 mg Irrigation On call 02/17/19 1105 02/17/19 1740   02/17/19 1115  ceFAZolin (ANCEF) IVPB 2g/100 mL premix     2 g 200 mL/hr over 30 Minutes Intravenous On call 02/17/19 1105 02/17/19 1658   02/16/19 1945  cefTRIAXone (ROCEPHIN) 1 g in sodium chloride 0.9 %  100 mL IVPB     1 g 200 mL/hr over 30 Minutes Intravenous  Once 02/16/19 1932 02/16/19 2020       Objective: Vitals:   02/22/19 1600 02/22/19 1947 02/23/19 0553 02/23/19 0809  BP: (!) 181/67 (!) 181/72 (!) 164/82 (!) 155/69  Pulse: 82 85 79 80  Resp: (!) 22 18 20 17   Temp: 98.3 F (36.8 C) 97.6 F (36.4 C) 98.2 F (36.8 C) 97.8 F  (36.6 C)  TempSrc: Oral Oral Oral Oral  SpO2:  94% 95% 97%  Weight:   61.9 kg   Height:        Intake/Output Summary (Last 24 hours) at 02/23/2019 0943 Last data filed at 02/23/2019 0900 Gross per 24 hour  Intake 120 ml  Output 500 ml  Net -380 ml   Filed Weights   02/20/19 0502 02/21/19 0407 02/23/19 0553  Weight: 61.8 kg 61 kg 61.9 kg     Examination: General exam: Appears calm and comfortable  Respiratory system: Clear to auscultation. Respiratory effort normal. Cardiovascular system: S1 & S2 heard, RRR. No pedal edema. Gastrointestinal system: Abdomen is nondistended, soft and nontender. Normal bowel sounds heard. Central nervous system: Alert, nonfocal exam Extremities: Symmetric in appearance bilaterally  Skin: No rashes, lesions or ulcers on exposed skin  Psychiatry: Stable this morning    Data Reviewed: I have personally reviewed following labs and imaging studies  CBC: Recent Labs  Lab 02/16/19 1802 02/17/19 0327 02/18/19 0218 02/18/19 1437 02/18/19 2028 02/19/19 0303 02/20/19 0342  WBC 8.4 10.4 15.0*  --   --  15.0* 8.4  NEUTROABS 5.5  --  12.7*  --   --  12.6*  --   HGB 13.2 14.3 12.8 11.2* 11.6* 12.3 10.5*  HCT 38.7 41.8 38.2 33.0* 34.0* 37.3 32.1*  MCV 89.2 88.6 90.1  --   --  91.9 93.0  PLT 222 245 185  --   --  216 0000000   Basic Metabolic Panel: Recent Labs  Lab 02/18/19 0218  02/19/19 0303 02/20/19 0342 02/21/19 0301 02/22/19 0258 02/23/19 0709  NA 142   < > 142 144 146* 146* 144  K 4.2   < > 4.0 3.8 4.3 4.7 3.8  CL 110  --  112* 117* 119* 120* 113*  CO2 18*  --  18* 19* 18* 17* 21*  GLUCOSE 122*  --  121* 85 81 92 102*  BUN 26*  --  33* 44* 39* 31* 27*  CREATININE 1.34*  --  1.38* 1.11* 1.03* 0.87 0.94  CALCIUM 10.0  --  9.9 9.6 9.8 9.6 10.0  MG 2.0  --  2.2  --   --   --   --   PHOS 3.4  --   --   --   --   --   --    < > = values in this interval not displayed.   GFR: Estimated Creatinine Clearance: 38.5 mL/min (by C-G formula  based on SCr of 0.94 mg/dL). Liver Function Tests: Recent Labs  Lab 02/17/19 0254 02/17/19 0327 02/18/19 0218 02/18/19 1000 02/19/19 0303  AST 38 47*  --  23 20  ALT 46* 42  --  24 16  ALKPHOS 170* 163*  --  113 106  BILITOT 0.7 1.2  --  0.6 0.7  PROT 7.2 6.2*  --  6.0* 5.9*  ALBUMIN 3.9 3.6 3.5 3.3* 3.1*   No results for input(s): LIPASE, AMYLASE in the last 168 hours. Recent Labs  Lab 02/18/19 1000 02/19/19 1026  AMMONIA 13 24   Coagulation Profile: No results for input(s): INR, PROTIME in the last 168 hours. Cardiac Enzymes: No results for input(s): CKTOTAL, CKMB, CKMBINDEX, TROPONINI in the last 168 hours. BNP (last 3 results) No results for input(s): PROBNP in the last 8760 hours. HbA1C: No results for input(s): HGBA1C in the last 72 hours. CBG: Recent Labs  Lab 02/17/19 1131  GLUCAP 102*   Lipid Profile: No results for input(s): CHOL, HDL, LDLCALC, TRIG, CHOLHDL, LDLDIRECT in the last 72 hours. Thyroid Function Tests: No results for input(s): TSH, T4TOTAL, FREET4, T3FREE, THYROIDAB in the last 72 hours. Anemia Panel: No results for input(s): VITAMINB12, FOLATE, FERRITIN, TIBC, IRON, RETICCTPCT in the last 72 hours. Sepsis Labs: No results for input(s): PROCALCITON, LATICACIDVEN in the last 168 hours.  Recent Results (from the past 240 hour(s))  SARS CORONAVIRUS 2 (TAT 6-24 HRS) Nasopharyngeal Nasopharyngeal Swab     Status: None   Collection Time: 02/17/19 12:09 AM   Specimen: Nasopharyngeal Swab  Result Value Ref Range Status   SARS Coronavirus 2 NEGATIVE NEGATIVE Final    Comment: (NOTE) SARS-CoV-2 target nucleic acids are NOT DETECTED. The SARS-CoV-2 RNA is generally detectable in upper and lower respiratory specimens during the acute phase of infection. Negative results do not preclude SARS-CoV-2 infection, do not rule out co-infections with other pathogens, and should not be used as the sole basis for treatment or other patient management  decisions. Negative results must be combined with clinical observations, patient history, and epidemiological information. The expected result is Negative. Fact Sheet for Patients: SugarRoll.be Fact Sheet for Healthcare Providers: https://www.woods-mathews.com/ This test is not yet approved or cleared by the [place] FDA and  has been authorized for detection and/or diagnosis of SARS-CoV-2 by FDA under an Emergency Use Authorization (EUA). This EUA will remain  in effect (meaning this test can be used) for the duration of the COVID-19 declaration under Section 56 4(b)(1) of the Act, 21 U.S.C. section 360bbb-3(b)(1), unless the authorization is terminated or revoked sooner. Performed at [facility], [place] [address] [place], [place] [address]   MRSA PCR Screening     Status: None   Collection Time: 02/17/19  3:15 AM   Specimen: Nasal Mucosa; Nasopharyngeal  Result Value Ref Range Status   MRSA by PCR NEGATIVE NEGATIVE Final    Comment:        The GeneXpert MRSA Assay (FDA approved for NASAL specimens only), is one component of a comprehensive MRSA colonization surveillance program. It is not intended to diagnose MRSA infection nor to guide or monitor treatment for MRSA infections. Performed at [facility], [place] [address] [place], [place] [address]   Surgical PCR screen     Status: None   Collection Time: 02/17/19  3:15 AM   Specimen: Nasal Mucosa; Nasal Swab  Result Value Ref Range Status   MRSA, PCR NEGATIVE NEGATIVE Final   Staphylococcus aureus NEGATIVE NEGATIVE Final    Comment: (NOTE) The Xpert SA Assay (FDA approved for NASAL specimens in patients 7 years of age and older), is one component of a comprehensive surveillance program. It is not intended to diagnose infection nor to guide or monitor treatment. Performed at [facility], [place] [address] [place], [place] [address]       Radiology  Studies: No results found.    Scheduled Meds: . amLODipine  10 mg Oral Daily  . aspirin EC  81 mg Oral QPC  breakfast  . Chlorhexidine Gluconate Cloth  6 each Topical Daily  . docusate sodium  100 mg Oral QHS  . enoxaparin (LOVENOX) injection  30 mg Subcutaneous QHS  . lisinopril  10 mg Oral Daily  . polyethylene glycol  17 g Oral QPC breakfast   Continuous Infusions:    LOS: 7 days      Time spent: 25 minutes   Dessa Phi, DO [facility] 02/23/2019, 9:43 AM   Available via Epic secure chat 7am-7pm After these hours, please refer to coverage provider listed on [email or web address]

## 2019-02-24 LAB — SARS CORONAVIRUS 2 (TAT 6-24 HRS): SARS Coronavirus 2: NEGATIVE

## 2019-02-24 MED ORDER — AMLODIPINE BESYLATE 10 MG PO TABS: 10.0000 mg | ORAL_TABLET | Freq: Every day | ORAL | Status: AC

## 2019-02-24 MED ORDER — LISINOPRIL 10 MG PO TABS: 10.0000 mg | ORAL_TABLET | Freq: Every day | ORAL | Status: AC

## 2019-02-24 NOTE — Progress Notes (Signed)
PROGRESS NOTE    Shannon Blair  [id number] DOB: 03-06-1932 DOA: 02/16/2019 PCP: Merrilee Seashore, MD     Brief Narrative:  Shannon Blair is a 85 y.o.female,w dementia, hypertension, hx of recurrent UTI, now presents with generalized weakness, and thought to have UTI. Pt is unable to provide meaningful history due to dementia. During hospitalization, patient was found to have complete heart block and patient was evaluated by cardiology/EP.  She underwent dual-chamber pacemaker placement on 11/30. Due to altered mental status, patient underwent CT head which was unremarkable.  She was also found to be hypoxemic and underwent CTA chest which was negative for PE.  Palliative care has been following as well for goals of care conversation.  Her mentation has improved.  New events last 24 hours / Subjective: No complaints this morning  Assessment & Plan:   Principal Problem:   Complete heart block (HCC) Active Problems:   Pressure injury of skin   Goals of care, counseling/discussion   Palliative care encounter   Unresponsive    Complete heart block -Status post dual-chamber pacemaker placement 11/30 -Follow-up at device clinic/EP clinic for device care as an outpatient  Acute metabolic encephalopathy, lethargy  -CT head negative -Ammonia 24 -Palliative care following -Improved  Acute hypoxemic respiratory failure -CTA chest negative for PE -Repeat ABG with improvement -Now on room air   Acute lower UTI, present on admission.  History of recurrent UTI -Urine culture not available, previous urine culture from earlier this year was pansensitive E. coli -Rocephin x 5 days   CKD stage IIIb -Baseline creatinine 1.3 -Stable  Hypertension  -Continue Norvasc, lisinopril, hydralazine as needed  Dementia -STOP Aricept -STOP Namenda ->can induce bradycardia -STOP Effexor ->can worsen hypertension  Urinary incontinence -STOP oxybutynin -> can affect  conduction    In agreement with assessment of the pressure ulcer as below:  Pressure Injury 02/17/19 Sacrum Medial Stage I -  Intact skin with non-blanchable redness of a localized area usually over a bony prominence. (Active)  02/17/19 0300  Location: Sacrum  Location Orientation: Medial  Staging: Stage I -  Intact skin with non-blanchable redness of a localized area usually over a bony prominence.  Wound Description (Comments):   Present on Admission: Yes         DVT prophylaxis: Lovenox Code Status: DNR Family Communication: None at bedside Disposition Plan: Awaiting SNF placement, insurance Auth pending.  Screening Covid test ordered   Consultants:   Cardiology  PCCM  EP  Palliative care   Antimicrobials:  Anti-infectives (From admission, onward)   Start     Dose/Rate Route Frequency Ordered Stop   02/17/19 2100  ceFAZolin (ANCEF) IVPB 1 g/50 mL premix     1 g 100 mL/hr over 30 Minutes Intravenous Every 6 hours 02/17/19 1816 02/18/19 1000   02/17/19 1800  cefTRIAXone (ROCEPHIN) 1 g in sodium chloride 0.9 % 100 mL IVPB     1 g 200 mL/hr over 30 Minutes Intravenous Every 24 hours 02/16/19 2335 02/22/19 2159   02/17/19 1115  gentamicin (GARAMYCIN) 80 mg in sodium chloride 0.9 % 500 mL irrigation     80 mg Irrigation On call 02/17/19 1105 02/17/19 1740   02/17/19 1115  ceFAZolin (ANCEF) IVPB 2g/100 mL premix     2 g 200 mL/hr over 30 Minutes Intravenous On call 02/17/19 1105 02/17/19 1658   02/16/19 1945  cefTRIAXone (ROCEPHIN) 1 g in sodium chloride 0.9 % 100 mL IVPB     1 g 200  mL/hr over 30 Minutes Intravenous  Once 02/16/19 1932 02/16/19 2020       Objective: Vitals:   02/23/19 1604 02/23/19 1944 02/24/19 0555 02/24/19 0755  BP: (!) 155/72 (!) 150/62 (!) 147/68 (!) 153/68  Pulse: 79 81 76 77  Resp: 19 (!) 25 18   Temp: 98.5 F (36.9 C) 97.8 F (36.6 C) 97.9 F (36.6 C) 97.8 F (36.6 C)  TempSrc: Oral Oral Oral Oral  SpO2: 100% 97% 94% 96%   Weight:   59.8 kg   Height:        Intake/Output Summary (Last 24 hours) at 02/24/2019 0951 Last data filed at 02/24/2019 0612 Gross per 24 hour  Intake -  Output 300 ml  Net -300 ml   Filed Weights   02/21/19 0407 02/23/19 0553 02/24/19 0555  Weight: 61 kg 61.9 kg 59.8 kg     Examination: General exam: Appears calm and comfortable  Respiratory system: Clear to auscultation. Respiratory effort normal. Cardiovascular system: S1 & S2 heard, RRR. No pedal edema. Gastrointestinal system: Abdomen is nondistended, soft and nontender. Normal bowel sounds heard. Central nervous system: Alert. Non focal exam. Speech clear  Extremities: Symmetric in appearance bilaterally  Skin: No rashes, lesions or ulcers on exposed skin  Psychiatry: Stable    Data Reviewed: I have personally reviewed following labs and imaging studies  CBC: Recent Labs  Lab 02/18/19 0218 02/18/19 1437 02/18/19 2028 02/19/19 0303 02/20/19 0342  WBC 15.0*  --   --  15.0* 8.4  NEUTROABS 12.7*  --   --  12.6*  --   HGB 12.8 11.2* 11.6* 12.3 10.5*  HCT 38.2 33.0* 34.0* 37.3 32.1*  MCV 90.1  --   --  91.9 93.0  PLT 185  --   --  216 0000000   Basic Metabolic Panel: Recent Labs  Lab 02/18/19 0218  02/19/19 0303 02/20/19 0342 02/21/19 0301 02/22/19 0258 02/23/19 0709  NA 142   < > 142 144 146* 146* 144  K 4.2   < > 4.0 3.8 4.3 4.7 3.8  CL 110  --  112* 117* 119* 120* 113*  CO2 18*  --  18* 19* 18* 17* 21*  GLUCOSE 122*  --  121* 85 81 92 102*  BUN 26*  --  33* 44* 39* 31* 27*  CREATININE 1.34*  --  1.38* 1.11* 1.03* 0.87 0.94  CALCIUM 10.0  --  9.9 9.6 9.8 9.6 10.0  MG 2.0  --  2.2  --   --   --   --   PHOS 3.4  --   --   --   --   --   --    < > = values in this interval not displayed.   GFR: Estimated Creatinine Clearance: 37.8 mL/min (by C-G formula based on SCr of 0.94 mg/dL). Liver Function Tests: Recent Labs  Lab 02/18/19 0218 02/18/19 1000 02/19/19 0303  AST  --  23 20  ALT  --  24 16   ALKPHOS  --  113 106  BILITOT  --  0.6 0.7  PROT  --  6.0* 5.9*  ALBUMIN 3.5 3.3* 3.1*   No results for input(s): LIPASE, AMYLASE in the last 168 hours. Recent Labs  Lab 02/18/19 1000 02/19/19 1026  AMMONIA 13 24   Coagulation Profile: No results for input(s): INR, PROTIME in the last 168 hours. Cardiac Enzymes: No results for input(s): CKTOTAL, CKMB, CKMBINDEX, TROPONINI in the last 168 hours. BNP (last 3  results) No results for input(s): PROBNP in the last 8760 hours. HbA1C: No results for input(s): HGBA1C in the last 72 hours. CBG: Recent Labs  Lab 02/17/19 1131  GLUCAP 102*   Lipid Profile: No results for input(s): CHOL, HDL, LDLCALC, TRIG, CHOLHDL, LDLDIRECT in the last 72 hours. Thyroid Function Tests: No results for input(s): TSH, T4TOTAL, FREET4, T3FREE, THYROIDAB in the last 72 hours. Anemia Panel: No results for input(s): VITAMINB12, FOLATE, FERRITIN, TIBC, IRON, RETICCTPCT in the last 72 hours. Sepsis Labs: No results for input(s): PROCALCITON, LATICACIDVEN in the last 168 hours.  Recent Results (from the past 240 hour(s))  SARS CORONAVIRUS 2 (TAT 6-24 HRS) Nasopharyngeal Nasopharyngeal Swab     Status: None   Collection Time: 02/17/19 12:09 AM   Specimen: Nasopharyngeal Swab  Result Value Ref Range Status   SARS Coronavirus 2 NEGATIVE NEGATIVE Final    Comment: (NOTE) SARS-CoV-2 target nucleic acids are NOT DETECTED. The SARS-CoV-2 RNA is generally detectable in upper and lower respiratory specimens during the acute phase of infection. Negative results do not preclude SARS-CoV-2 infection, do not rule out co-infections with other pathogens, and should not be used as the sole basis for treatment or other patient management decisions. Negative results must be combined with clinical observations, patient history, and epidemiological information. The expected result is Negative. Fact Sheet for Patients: SugarRoll.be Fact Sheet  for Healthcare Providers: https://www.woods-mathews.com/ This test is not yet approved or cleared by the [place] FDA and  has been authorized for detection and/or diagnosis of SARS-CoV-2 by FDA under an Emergency Use Authorization (EUA). This EUA will remain  in effect (meaning this test can be used) for the duration of the COVID-19 declaration under Section 56 4(b)(1) of the Act, 21 U.S.C. section 360bbb-3(b)(1), unless the authorization is terminated or revoked sooner. Performed at [facility], [place] [address] [place], [place] [address]   MRSA PCR Screening     Status: None   Collection Time: 02/17/19  3:15 AM   Specimen: Nasal Mucosa; Nasopharyngeal  Result Value Ref Range Status   MRSA by PCR NEGATIVE NEGATIVE Final    Comment:        The GeneXpert MRSA Assay (FDA approved for NASAL specimens only), is one component of a comprehensive MRSA colonization surveillance program. It is not intended to diagnose MRSA infection nor to guide or monitor treatment for MRSA infections. Performed at [facility], [place] [address] [place], [place] [address]   Surgical PCR screen     Status: None   Collection Time: 02/17/19  3:15 AM   Specimen: Nasal Mucosa; Nasal Swab  Result Value Ref Range Status   MRSA, PCR NEGATIVE NEGATIVE Final   Staphylococcus aureus NEGATIVE NEGATIVE Final    Comment: (NOTE) The Xpert SA Assay (FDA approved for NASAL specimens in patients 41 years of age and older), is one component of a comprehensive surveillance program. It is not intended to diagnose infection nor to guide or monitor treatment. Performed at [facility], [place] [address], [place], [place] [address]       Radiology Studies: No results found.    Scheduled Meds: . amLODipine  10 mg Oral Daily  . aspirin EC  81 mg Oral QPC breakfast  . Chlorhexidine Gluconate Cloth  6 each Topical Daily  . docusate sodium  100 mg Oral QHS  . enoxaparin (LOVENOX)  injection  30 mg Subcutaneous QHS  . lisinopril  10 mg Oral Daily  . polyethylene glycol  17 g Oral QPC breakfast   Continuous Infusions:    LOS: 8 days      Time spent: 32minutes   Dessa Phi, DO [facility] 02/24/2019, 9:51 AM   Available via Epic secure chat 7am-7pm After these hours, please refer to coverage provider listed on [email or web address]

## 2019-02-24 NOTE — TOC Progression Note (Signed)
Transition of Care [facility]) - Progression Note    Patient Details  Name: MICHALIA TROXLER MRN: [id number] Date of Birth: 1931/12/07  Transition of Care [facility]) CM/SW [place], [place] Phone Number: 02/24/2019, 4:05 PM  Clinical Narrative:     CSW informed by [facility] patient POA will complete admission paperwork tomorrow.  Patient has received insurance authorization, negative covid test results are back-   Thurmond Butts, MSW, [facility] Clinical Social Worker [phone]   Expected Discharge Plan: [place] Barriers to Discharge: Insurance Authorization(Pending covid test)  Expected Discharge Plan and Services Expected Discharge Plan: [place] In-house Referral: NA   Post Acute Care Choice: [place] arrangements for the past 2 months: [place]: [facility]         Social Determinants of Health (SDOH) Interventions    Readmission Risk Interventions No flowsheet data found.

## 2019-02-24 NOTE — TOC Progression Note (Signed)
Transition of Care [facility]) - Progression Note    Patient Details  Name: Shannon Blair MRN: [id number] Date of Birth: [birth year]  Transition of Care [facility]) CM/SW [place], [place] Phone Number: 02/24/2019, 1:42 PM  Clinical Narrative:     CSW spoke with patient's POA, Joann, updated [facility] has insurance authorization. Patient's Covid is pending. CSW informed [facility] needs admission paperwork completed. CSW advised [facility] to contact patient's POA.  Thurmond Butts, MSW, [facility] Clinical Social Worker [phone]   Expected Discharge Plan: Skilled Nursing Facility Barriers to Discharge: Insurance Authorization(Pending covid test)  Expected Discharge Plan and Services Expected Discharge Plan: [place] In-house Referral: NA   Post Acute Care Choice: [place] arrangements for the past 2 months: [place]: [place]         Social Determinants of Health (SDOH) Interventions    Readmission Risk Interventions No flowsheet data found.

## 2019-02-24 NOTE — Progress Notes (Signed)
Physical Therapy Treatment Patient Details Name: Shannon Blair MRN: [id number] DOB: 06-29-1931 Today's Date: 02/24/2019    History of Present Illness 85yo female presenting with weakness and possible UTI, complete heart block identified via EKG. She received surgery for pacemaker placement 02/17/19. On 02/18/19 she became less responsive and with AMS, CT and CTA negative for acute changes that would explain this. PMH dementia, HTN, anal CA, lumbar laminectomy/decompression    PT Comments    Patient received in bed, leaning to right side, alert, not oriented. Patient agrees to work with PT. Requires total/max assist to go from supine to sitting on edge of bed. Requires min assist to maintain sitting balance, falling posteriorly or to right when not being assisted. Patient then transferred from bed to recliner with max assist. Not really reaching for recliner arm when cued to do so. Patient will continue to benefit from skilled PT to improve strength and functional independence.     Follow Up Recommendations  SNF;Supervision/Assistance - 24 hour     Equipment Recommendations  None recommended by PT    Recommendations for Other Services       Precautions / Restrictions Precautions Precautions: ICD/Pacemaker;Fall Precaution Comments: dementia, standard pacemaker precautions Restrictions Weight Bearing Restrictions: No    Mobility  Bed Mobility Overal bed mobility: Needs Assistance Bed Mobility: Supine to Sit     Supine to sit: Max assist     General bed mobility comments: attempted to move toward edge of bed, but was unable without max assist.  Transfers Overall transfer level: Needs assistance Equipment used: None Transfers: Sit to/from Stand Sit to Stand: Max assist;From elevated surface Stand pivot transfers: Max assist;From elevated surface          Ambulation/Gait             General Gait Details: unable   Estate agent    Modified Rankin (Stroke Patients Only)       Balance Overall balance assessment: Needs assistance Sitting-balance support: Bilateral upper extremity supported;Feet supported Sitting balance-Leahy Scale: Poor Sitting balance - Comments: unable to sit unassisted. Falling posteriorly and to right Postural control: Posterior lean;Right lateral lean Standing balance support: During functional activity Standing balance-Leahy Scale: Zero                              Cognition Arousal/Alertness: Awake/alert Behavior During Therapy: Flat affect Overall Cognitive Status: No family/caregiver present to determine baseline cognitive functioning Area of Impairment: Orientation;Awareness;Attention;Problem solving;Following commands;Safety/judgement                 Orientation Level: Disoriented to;Place;Time;Situation Current Attention Level: Sustained Memory: Decreased recall of precautions;Decreased short-term memory Following Commands: Follows one step commands inconsistently;Follows one step commands with increased time Safety/Judgement: Decreased awareness of safety;Decreased awareness of deficits Awareness: Intellectual Problem Solving: Slow processing;Requires verbal cues;Requires tactile cues;Difficulty sequencing;Decreased initiation        Exercises      General Comments        Pertinent Vitals/Pain Pain Assessment: No/denies pain    Home Living                      Prior Function            PT Goals (current goals can now be found in the care plan section) Acute Rehab PT Goals Patient Stated Goal: unable to state  PT Goal Formulation: Patient unable to participate in goal setting Time For Goal Achievement: 03/06/19 Potential to Achieve Goals: Fair Progress towards PT goals: Progressing toward goals    Frequency    Min 3X/week      PT Plan Current plan remains appropriate    Co-evaluation              AM-PAC  PT "6 Clicks" Mobility   Outcome Measure  Help needed turning from your back to your side while in a flat bed without using bedrails?: A Lot Help needed moving from lying on your back to sitting on the side of a flat bed without using bedrails?: Total Help needed moving to and from a bed to a chair (including a wheelchair)?: Total Help needed standing up from a chair using your arms (e.g., wheelchair or bedside chair)?: Total Help needed to walk in hospital room?: Total Help needed climbing 3-5 steps with a railing? : Total 6 Click Score: 7    End of Session Equipment Utilized During Treatment: Gait belt Activity Tolerance: Patient tolerated treatment well;Patient limited by lethargy Patient left: in chair;with chair alarm set;with call bell/phone within reach Nurse Communication: Mobility status PT Visit Diagnosis: Other abnormalities of gait and mobility (R26.89);Muscle weakness (generalized) (M62.81)     Time: [id number] PT Time Calculation (min) (ACUTE ONLY): 20 min  Charges:  $Therapeutic Activity: 8-22 mins                     Ariyah Sedlack, PT, GCS 02/24/19,9:09 AM

## 2019-02-25 DIAGNOSIS — R531 Weakness: Secondary | ICD-10-CM | POA: Diagnosis not present

## 2019-02-25 DIAGNOSIS — S0083XA Contusion of other part of head, initial encounter: Secondary | ICD-10-CM | POA: Diagnosis not present

## 2019-02-25 DIAGNOSIS — W19XXXA Unspecified fall, initial encounter: Secondary | ICD-10-CM | POA: Diagnosis not present

## 2019-02-25 DIAGNOSIS — E785 Hyperlipidemia, unspecified: Secondary | ICD-10-CM | POA: Diagnosis not present

## 2019-02-25 DIAGNOSIS — N390 Urinary tract infection, site not specified: Secondary | ICD-10-CM | POA: Diagnosis not present

## 2019-02-25 DIAGNOSIS — N189 Chronic kidney disease, unspecified: Secondary | ICD-10-CM | POA: Diagnosis not present

## 2019-02-25 DIAGNOSIS — R2681 Unsteadiness on feet: Secondary | ICD-10-CM | POA: Diagnosis not present

## 2019-02-25 DIAGNOSIS — D519 Vitamin B12 deficiency anemia, unspecified: Secondary | ICD-10-CM | POA: Diagnosis not present

## 2019-02-25 DIAGNOSIS — R404 Transient alteration of awareness: Secondary | ICD-10-CM | POA: Diagnosis not present

## 2019-02-25 DIAGNOSIS — Z20828 Contact with and (suspected) exposure to other viral communicable diseases: Secondary | ICD-10-CM | POA: Diagnosis not present

## 2019-02-25 DIAGNOSIS — N1832 Chronic kidney disease, stage 3b: Secondary | ICD-10-CM | POA: Diagnosis not present

## 2019-02-25 DIAGNOSIS — I10 Essential (primary) hypertension: Secondary | ICD-10-CM | POA: Diagnosis not present

## 2019-02-25 DIAGNOSIS — E039 Hypothyroidism, unspecified: Secondary | ICD-10-CM | POA: Diagnosis not present

## 2019-02-25 DIAGNOSIS — Z743 Need for continuous supervision: Secondary | ICD-10-CM | POA: Diagnosis not present

## 2019-02-25 DIAGNOSIS — U071 COVID-19: Secondary | ICD-10-CM | POA: Diagnosis not present

## 2019-02-25 DIAGNOSIS — Z79899 Other long term (current) drug therapy: Secondary | ICD-10-CM | POA: Diagnosis not present

## 2019-02-25 DIAGNOSIS — R278 Other lack of coordination: Secondary | ICD-10-CM | POA: Diagnosis not present

## 2019-02-25 DIAGNOSIS — F329 Major depressive disorder, single episode, unspecified: Secondary | ICD-10-CM | POA: Diagnosis not present

## 2019-02-25 DIAGNOSIS — Z950 Presence of cardiac pacemaker: Secondary | ICD-10-CM | POA: Diagnosis not present

## 2019-02-25 DIAGNOSIS — R279 Unspecified lack of coordination: Secondary | ICD-10-CM | POA: Diagnosis not present

## 2019-02-25 DIAGNOSIS — F0390 Unspecified dementia without behavioral disturbance: Secondary | ICD-10-CM | POA: Diagnosis not present

## 2019-02-25 DIAGNOSIS — R319 Hematuria, unspecified: Secondary | ICD-10-CM | POA: Diagnosis not present

## 2019-02-25 DIAGNOSIS — D649 Anemia, unspecified: Secondary | ICD-10-CM | POA: Diagnosis not present

## 2019-02-25 DIAGNOSIS — C210 Malignant neoplasm of anus, unspecified: Secondary | ICD-10-CM | POA: Diagnosis not present

## 2019-02-25 DIAGNOSIS — R229 Localized swelling, mass and lump, unspecified: Secondary | ICD-10-CM | POA: Diagnosis not present

## 2019-02-25 DIAGNOSIS — R109 Unspecified abdominal pain: Secondary | ICD-10-CM | POA: Diagnosis not present

## 2019-02-25 DIAGNOSIS — I509 Heart failure, unspecified: Secondary | ICD-10-CM | POA: Diagnosis not present

## 2019-02-25 DIAGNOSIS — N183 Chronic kidney disease, stage 3 unspecified: Secondary | ICD-10-CM | POA: Diagnosis not present

## 2019-02-25 DIAGNOSIS — I442 Atrioventricular block, complete: Secondary | ICD-10-CM | POA: Diagnosis not present

## 2019-02-25 DIAGNOSIS — R41841 Cognitive communication deficit: Secondary | ICD-10-CM | POA: Diagnosis not present

## 2019-02-25 DIAGNOSIS — J9601 Acute respiratory failure with hypoxia: Secondary | ICD-10-CM | POA: Diagnosis not present

## 2019-02-25 DIAGNOSIS — G9341 Metabolic encephalopathy: Secondary | ICD-10-CM | POA: Diagnosis not present

## 2019-02-25 DIAGNOSIS — E559 Vitamin D deficiency, unspecified: Secondary | ICD-10-CM | POA: Diagnosis not present

## 2019-02-25 DIAGNOSIS — R1312 Dysphagia, oropharyngeal phase: Secondary | ICD-10-CM | POA: Diagnosis not present

## 2019-02-25 NOTE — Progress Notes (Signed)
02/25/2019 10:37 AM Pt discharged to [facility] via [facility].  Report to be called. [place]

## 2019-02-25 NOTE — TOC Transition Note (Addendum)
Transition of Care [facility]) - CM/SW Discharge Note   Patient Details  Name: Shannon Blair MRN: [id number] Date of Birth: Feb 22, 1932  [facility] CM/SW Contact:  Vinie Sill, [place] Phone Number: 02/25/2019, 9:44 AM   Clinical Narrative:     Patient will DC to: [facility]  DC Date: 02/25/2019 Family Notified: Joann. POA Transport By: Corey Harold  RN, please call POA, [phone]- per her request.   RN, patient, and facility notified of DC. Discharge Summary sent to facility. RN given number for [phone] Room 3214. Ambulance transport requested for patient.   Clinical Social Worker signing off. Thurmond Butts, MSW, LCSWA Clinical Social Worker [phone]      Barriers to Discharge: Insurance Authorization(Pending covid test)   Patient Goals and CMS Choice Patient states their goals for this hospitalization and ongoing recovery are:: "to get her home if she is able"   Choice offered to / list presented to : [place] / [place]  Discharge Placement PASRR number recieved: 02/22/19            Patient [facility] bed at: [facility] Patient to be transferred to facility by: [place] Name of family member notified: Arville Go, [place] Patient and family notified of of transfer: 02/25/19  Discharge Plan and Services In-house Referral: NA   Post Acute Care Choice: [place]: [place]        Social Determinants of Health (SDOH) Interventions     Readmission Risk Interventions No flowsheet data found.

## 2019-02-25 NOTE — Progress Notes (Signed)
02/25/2019  10:59 AM Shannon Blair-pt's POA called and notified of pt's discharge to SNF. Car[place]ers

## 2019-02-25 NOTE — Progress Notes (Signed)
Occupational Therapy Treatment Patient Details Name: Shannon Blair MRN: [id number] DOB: 1931-06-14 Today's Date: 02/25/2019    History of present illness 85yo female presenting with weakness and possible UTI, complete heart block identified via EKG. She received surgery for pacemaker placement 02/17/19. On 02/18/19 she became less responsive and with AMS, CT and CTA negative for acute changes that would explain this. PMH dementia, HTN, anal CA, lumbar laminectomy/decompression   OT comments  Pt making minimal progress towards OT goals this session. Session focus on sitting balance and seated UB ADLs. Pt required MAX A +2 to transition from supine >EOB. Pt able to sit EOB ~ 5 mins with MIN A for balance. Pt initially required MAX A to engage in UB ADL progressing to set-up/ supervision for safety. Pt complete sit>supine with just MIN A as pt was ready to lay back down. DC plan remains appropriate, will continue to follow acutely per POC.    Follow Up Recommendations  SNF;Supervision/Assistance - 24 hour    Equipment Recommendations  Other (comment)(defer to next venue of care)    Recommendations for Other Services      Precautions / Restrictions Precautions Precautions: ICD/Pacemaker;Fall Precaution Comments: dementia, standard pacemaker precautions Restrictions Weight Bearing Restrictions: Yes RUE Weight Bearing: Weight bearing as tolerated LUE Weight Bearing: Weight bearing as tolerated RLE Weight Bearing: Weight bearing as tolerated LLE Weight Bearing: Weight bearing as tolerated       Mobility Bed Mobility Overal bed mobility: Needs Assistance Bed Mobility: Supine to Sit;Sit to Supine     Supine to sit: Max assist;+2 for safety/equipment Sit to supine: Min assist;+2 for safety/equipment   General bed mobility comments: pt required MAX A +2 for supine >sit for all aspects of mobility, but returned to supine to with MIN A +2 needing only assist to roll onto back and  maneuver BLEs back into bed  Transfers                 General transfer comment: deferred this session    Balance Overall balance assessment: Needs assistance Sitting-balance support: Bilateral upper extremity supported;Feet supported Sitting balance-Leahy Scale: Poor Sitting balance - Comments: unable to sit unassisted. Falling posteriorly and to right Postural control: Posterior lean;Right lateral lean                                 ADL either performed or assessed with clinical judgement   ADL Overall ADL's : Needs assistance/impaired     Grooming: Wash/dry face;Sitting;Maximal assistance;Set up;Supervision/safety Grooming Details (indicate cue type and reason): pt required MAX A initially to complete task with pt progressing to set-up/ supervision from sitting EOB                   Toilet Transfer Details (indicate cue type and reason): deferred d/t pt minimally following commands         Functional mobility during ADLs: Maximal assistance;+2 for physical assistance(bed mobility only) General ADL Comments: pt minimally following commands but able to transition to EOB with MAX A +2 for seated UB ADLs. Pt sat EOB 5 mins with MIN A for sitting balance     Vision Wears Glasses: Reading only Patient Visual Report: No change from baseline     Perception     Praxis      Cognition Arousal/Alertness: Lethargic Behavior During Therapy: Flat affect Overall Cognitive Status: History of cognitive impairments - at baseline  General Comments: L inattention noted; history of dementia at baseline. able to state name and and month and  date of DOB but not year        Exercises     Shoulder Instructions       General Comments      Pertinent Vitals/ Pain       Pain Assessment: Faces Faces Pain Scale: Hurts little more Pain Location: general discomfort when trasnitoning to EOB Pain Descriptors /  Indicators: Discomfort Pain Intervention(s): Limited activity within patient's tolerance;Monitored during session;Repositioned  Home Living                                          Prior Functioning/Environment              Frequency  Min 2X/week        Progress Toward Goals  OT Goals(current goals can now be found in the care plan section)  Progress towards OT goals: Progressing toward goals  Acute Rehab OT Goals OT Goal Formulation: With family Time For Goal Achievement: 03/07/19 Potential to Achieve Goals: [place] Discharge plan remains appropriate    Co-evaluation                 AM-PAC OT "6 Clicks" Daily Activity     Outcome Measure   Help from another person eating meals?: A Lot Help from another person taking care of personal grooming?: A Lot Help from another person toileting, which includes using toliet, bedpan, or urinal?: Total Help from another person bathing (including washing, rinsing, drying)?: A Lot Help from another person to put on and taking off regular upper body clothing?: Total Help from another person to put on and taking off regular lower body clothing?: Total 6 Click Score: 9    End of Session    OT Visit Diagnosis: Unsteadiness on feet (R26.81);Other abnormalities of gait and mobility (R26.89);Muscle weakness (generalized) (M62.81);Low vision, both eyes (H54.2);Other symptoms and signs involving the nervous system (R29.898);Hemiplegia and hemiparesis Hemiplegia - Right/Left: Left Hemiplegia - dominant/non-dominant: Non-Dominant   Activity Tolerance Patient limited by lethargy   Patient Left in bed;with call bell/phone within reach;with bed alarm set   Nurse Communication          Time: [id number] OT Time Calculation (min): 16 min  Charges: OT General Charges $OT Visit: 1 Visit OT Treatments $Self Care/Home Management : 8-22 mins  Lanier Clam., COTA/L Acute Rehabilitation  Services [phone]    Ihor Gully 02/25/2019, 10:58 AM

## 2019-02-25 NOTE — Discharge Summary (Signed)
Physician Discharge Summary  Shannon Blair [id number] DOB: Feb 17, 1932 DOA: 02/16/2019  PCP: Merrilee Seashore, MD  Admit date: 02/16/2019 Discharge date: 02/25/2019  Admitted From: Home Disposition:  SNF   Recommendations for Outpatient Follow-up:  1. Follow up with PCP in 1 week 2. Follow up with EP as scheduled on 02/27/2019 for pacemaker wound check  3. Follow up with Cardiology 05/22/2019 for 40mo post pacemaker check   Discharge Condition: Stable CODE STATUS: DNR   Diet recommendation: Heart healthy   Brief/Interim Summary: Shannon Blair is a 85 y.o.female,w dementia, hypertension, hx of recurrent UTI, now presents with generalized weakness, and thought to have UTI. Pt is unable to provide meaningful history due to dementia. During hospitalization, patient was found to have complete heart block and patient was evaluated by cardiology/EP.  She underwent dual-chamber pacemaker placement on 11/30. Due to altered mental status, patient underwent CT head which was unremarkable.  She was also found to be hypoxemic and underwent CTA chest which was negative for PE.  Palliative care has been following as well for goals of care conversation.  Her mentation has improved.  Discharge Diagnoses:  Principal Problem:   Complete heart block ([place]) Active Problems:   Pressure injury of skin   Goals of care, counseling/discussion   Palliative care encounter   Unresponsive   Complete heart block -Status post dual-chamber pacemaker placement 11/30 -Follow-up at device clinic/EP clinic for device care as an outpatient  Acute metabolic encephalopathy, lethargy  -CT head negative -Ammonia 24 -Palliative care following -Improved  Acute hypoxemic respiratory failure -CTA chest negative for PE -Repeat ABG with improvement -Now on room air   Acute lower UTI, present on admission.  History of recurrent UTI -Urine culture not available, previous urine culture from earlier this  year was pansensitive E. coli -Rocephin completed   CKD stage IIIb -Baseline creatinine 1.3 -Stable  Hypertension  -Continue Norvasc, lisinopril  Dementia -STOP Aricept -STOP Namenda ->can induce bradycardia -STOP Effexor ->can worsen hypertension  Urinary incontinence -STOP oxybutynin -> can affect conduction   In agreement with assessment of the pressure ulcer as below:  Pressure Injury 02/17/19 Sacrum Medial Stage I -  Intact skin with non-blanchable redness of a localized area usually over a bony prominence. (Active)  02/17/19 0300  Location: Sacrum  Location Orientation: Medial  Staging: Stage I -  Intact skin with non-blanchable redness of a localized area usually over a bony prominence.  Wound Description (Comments):   Present on Admission: Yes       Discharge Instructions  Discharge Instructions    Diet - low sodium heart healthy   Complete by: As directed    Increase activity slowly   Complete by: As directed      Allergies as of 02/25/2019      Reactions   Tape Other (See Comments)   SKIN IS VERY THIN, SO PLEASE USE PAPER TAPE ONLY!!   Tilapia [fish Allergy] Rash, Other (See Comments)   Facial rash      Medication List    STOP taking these medications   cephALEXin 500 MG capsule Commonly known as: KEFLEX   donepezil 10 MG tablet Commonly known as: ARICEPT   memantine 10 MG tablet Commonly known as: NAMENDA   oxybutynin 5 MG tablet Commonly known as: DITROPAN   venlafaxine XR 75 MG 24 hr capsule Commonly known as: EFFEXOR-XR     TAKE these medications   acetaminophen 650 MG CR tablet Commonly known as: TYLENOL Take 650 mg  by mouth 2 (two) times daily.   amLODipine 10 MG tablet Commonly known as: NORVASC Take 1 tablet (10 mg total) by mouth daily. What changed:   medication strength  how much to take   aspirin EC 81 MG tablet Take 81 mg by mouth daily after breakfast.   docusate sodium 100 MG capsule Commonly known as:  COLACE Take 100 mg by mouth at bedtime.   feeding supplement (ENSURE ENLIVE) Liqd Take 237 mLs by mouth 2 (two) times daily between meals.   Fish Oil 1000 MG Caps Take 1,000 mg by mouth daily with breakfast.   lisinopril 10 MG tablet Commonly known as: ZESTRIL Take 1 tablet (10 mg total) by mouth daily.   multivitamin with minerals Tabs tablet Take 1 tablet by mouth daily with breakfast.   nitrofurantoin 100 MG capsule Commonly known as: MACRODANTIN Take 100 mg by mouth at bedtime.   Osteo Bi-Flex Adv Joint Shield Tabs Take 1 tablet by mouth daily with breakfast.   polyethylene glycol powder 17 GM/SCOOP powder Commonly known as: GLYCOLAX/MIRALAX Take 17 g by mouth daily after breakfast. MIX AND DRINK   Red Yeast Rice 600 MG Tabs Take 600 mg by mouth daily with breakfast.   Resource ThickenUp Clear Powd 1 scoop  For 4 oz liquid, 2 scoops for eight oz and add liquids.   vitamin B-12 1000 MCG tablet Commonly known as: CYANOCOBALAMIN Take 1,000 mcg by mouth daily with breakfast.   vitamin C 500 MG tablet Commonly known as: ASCORBIC ACID Take 500 mg by mouth daily with breakfast.   Vitamin D3 50 MCG (2000 UT) capsule Take 2,000 Units by mouth daily with breakfast.   VITAMIN E PO Take 1 capsule by mouth daily with breakfast.      Follow-up Information    Constance Haw, MD Follow up on 05/22/2019.   Specialty: Cardiology Why: at 215 pm for 3 month post pacemaker check Contact information: [address] [place] [address] [place] Follow up on 02/27/2019.   Why: at 1200 noon for post pacemaker wound check Contact information: [place] [phone]       Merrilee Seashore, MD. Schedule an appointment as soon as possible for a visit in 1 week(s).   Specialty: Internal Medicine Contact information: [place] [phone]          Allergies  Allergen Reactions  . Tape Other (See Comments)    SKIN IS VERY THIN, SO PLEASE USE PAPER TAPE ONLY!!  . Tilapia [Fish Allergy] Rash and Other (See Comments)    Facial rash    Consultants:   Cardiology  PCCM  EP  Palliative care   Procedures/Studies: Dg Chest 2 View  Result Date: 02/18/2019 CLINICAL DATA:  ICD placement. EXAM: CHEST - 2 VIEW COMPARISON:  Two-view chest x-ray 07/05/2018 FINDINGS: The heart size is exaggerate by low lung volumes. Mild pulmonary vascular congestion is present. Bibasilar airspace opacities likely reflect atelectasis. A dual-chamber pacemaker has been placed. Leads terminate in the right atrium and right ventricle. Atherosclerotic changes are noted at the aorta. IMPRESSION: 1. Interval placement of dual-chamber pacemaker without radiographic evidence for complication. 2. Low lung volumes and mild bibasilar atelectasis. 3. Mild pulmonary vascular congestion. Electronically Signed   By: San Morelle M.D.   On: 02/18/2019 07:25   Ct Head Wo Contrast  Result Date: 02/18/2019 CLINICAL DATA:  Altered level of consciousness (LOC), unexplained. Additional history provided: Patient became unresponsive around 8:30-9 AM. EXAM: CT HEAD WITHOUT CONTRAST TECHNIQUE: Contiguous axial images were obtained from the base of the skull through the vertex without intravenous contrast. COMPARISON:  Head CT 11/18/2018 FINDINGS: Brain: Mildly motion degraded examination. No evidence of acute intracranial hemorrhage. No demarcated cortical infarction. No evidence of intracranial mass. No midline shift or extra-axial fluid collection. Advanced patchy and confluent hypodensity within the cerebral white matter is nonspecific, but consistent with chronic small vessel ischemic disease. A tiny chronic lacunar infarct within the right basal ganglia was better appreciated on prior head CT 11/18/2018. Mild  generalized parenchymal atrophy. Vascular: No hyperdense vessel.  Atherosclerotic calcifications. Skull: Normal. Negative for fracture or focal lesion. Sinuses/Orbits: Visualized orbits demonstrate no acute abnormality. Minimal scattered paranasal sinus mucosal thickening. No significant mastoid effusion. IMPRESSION: 1. No CT evidence of acute intracranial abnormality. 2. Generalized parenchymal atrophy with advanced chronic small vessel ischemic disease. Electronically Signed   By: Kellie Simmering DO   On: 02/18/2019 10:24   Ct Angio Chest Pe W Or Wo Contrast  Result Date: 02/18/2019 CLINICAL DATA:  Hypoxia. Altered mental status. EXAM: CT ANGIOGRAPHY CHEST WITH CONTRAST TECHNIQUE: Multidetector CT imaging of the chest was performed using the standard protocol during bolus administration of intravenous contrast. Multiplanar CT image reconstructions and MIPs were obtained to evaluate the vascular anatomy. CONTRAST:  176mL OMNIPAQUE IOHEXOL 300 MG/ML  SOLN COMPARISON:  CT scan dated 03/03/2016 FINDINGS: Cardiovascular: Satisfactory opacification of the pulmonary arteries to the segmental level. No evidence of pulmonary embolism. New mild cardiomegaly. Aortic atherosclerosis. Scattered coronary artery calcifications. No pericardial effusion. RV LV ratio is normal. Pacemaker in place. No pericardial effusion. Mediastinum/Nodes: No enlarged mediastinal, hilar, or axillary lymph nodes. Thyroid gland, trachea, and esophagus demonstrate no significant findings. Lungs/Pleura: Small bilateral pleural effusions, right greater than left with compressive atelectasis in the dependent portions of both lungs. No infiltrates. Small intrapulmonary node along the minor fissure on the right not of any consequence. Upper Abdomen: Normal. Musculoskeletal: No acute bone abnormality. Osteophytes fuse much of the thoracic spine. Review of the MIP images confirms the above findings. IMPRESSION: 1. No pulmonary emboli. 2. New mild  cardiomegaly. 3. Small bilateral pleural effusions, right greater than left with compressive atelectasis in the dependent portions of both lungs. 4. Aortic Atherosclerosis (ICD10-I70.0). Electronically Signed   By: Lorriane Shire M.D.   On: 02/18/2019 17:33       Discharge Exam: Vitals:   02/25/19 0449 02/25/19 0752  BP: (!) 148/54 (!) 151/71  Pulse:  72  Resp: 19 16  Temp: 98.2 F (36.8 C) 98.2 F (36.8 C)  SpO2: 94% 97%    General: Pt is alert, awake, not in acute distress Cardiovascular: RRR, S1/S2 +, no edema Respiratory: CTA bilaterally, no wheezing, no rhonchi, no respiratory distress, no conversational dyspnea  Abdominal: Soft, NT, ND, bowel sounds + Extremities: no edema, no cyanosis Psych: Stable     The results of significant diagnostics from this hospitalization (including imaging, microbiology, ancillary and laboratory) are listed below for reference.     Microbiology: Recent Results (from the past 240 hour(s))  SARS CORONAVIRUS 2 (TAT 6-24 HRS) Nasopharyngeal Nasopharyngeal Swab     Status: None   Collection Time: 02/17/19 12:09 AM   Specimen: Nasopharyngeal Swab  Result Value Ref Range Status   SARS Coronavirus 2 NEGATIVE NEGATIVE Final    Comment: (NOTE) SARS-CoV-2 target nucleic acids are  NOT DETECTED. The SARS-CoV-2 RNA is generally detectable in upper and lower respiratory specimens during the acute phase of infection. Negative results do not preclude SARS-CoV-2 infection, do not rule out co-infections with other pathogens, and should not be used as the sole basis for treatment or other patient management decisions. Negative results must be combined with clinical observations, patient history, and epidemiological information. The expected result is Negative. Fact Sheet for Patients: SugarRoll.be Fact Sheet for Healthcare Providers: https://www.woods-mathews.com/ This test is not yet approved or cleared by the  [place] FDA and  has been authorized for detection and/or diagnosis of SARS-CoV-2 by FDA under an Emergency Use Authorization (EUA). This EUA will remain  in effect (meaning this test can be used) for the duration of the COVID-19 declaration under Section 56 4(b)(1) of the Act, 21 U.S.C. section 360bbb-3(b)(1), unless the authorization is terminated or revoked sooner. Performed at [facility], [place] [address], [place], [place] [address]   MRSA PCR Screening     Status: None   Collection Time: 02/17/19  3:15 AM   Specimen: Nasal Mucosa; Nasopharyngeal  Result Value Ref Range Status   MRSA by PCR NEGATIVE NEGATIVE Final    Comment:        The GeneXpert MRSA Assay (FDA approved for NASAL specimens only), is one component of a comprehensive MRSA colonization surveillance program. It is not intended to diagnose MRSA infection nor to guide or monitor treatment for MRSA infections. Performed at [facility], [place] [address] [place], [place] [address]   Surgical PCR screen     Status: None   Collection Time: 02/17/19  3:15 AM   Specimen: Nasal Mucosa; Nasal Swab  Result Value Ref Range Status   MRSA, PCR NEGATIVE NEGATIVE Final   Staphylococcus aureus NEGATIVE NEGATIVE Final    Comment: (NOTE) The Xpert SA Assay (FDA approved for NASAL specimens in patients 71 years of age and older), is one component of a comprehensive surveillance program. It is not intended to diagnose infection nor to guide or monitor treatment. Performed at [facility], [place] [address] [place], [place] [address]   SARS CORONAVIRUS 2 (TAT 6-24 HRS) Nasopharyngeal Nasopharyngeal Swab     Status: None   Collection Time: 02/24/19 10:03 AM   Specimen: Nasopharyngeal Swab  Result Value Ref Range Status   SARS Coronavirus 2 NEGATIVE NEGATIVE Final    Comment: (NOTE) SARS-CoV-2 target nucleic acids are NOT DETECTED. The SARS-CoV-2 RNA is generally detectable in upper and  lower respiratory specimens during the acute phase of infection. Negative results do not preclude SARS-CoV-2 infection, do not rule out co-infections with other pathogens, and should not be used as the sole basis for treatment or other patient management decisions. Negative results must be combined with clinical observations, patient history, and epidemiological information. The expected result is Negative. Fact Sheet for Patients: SugarRoll.be Fact Sheet for Healthcare Providers: https://www.woods-mathews.com/ This test is not yet approved or cleared by the [place] FDA and  has been authorized for detection and/or diagnosis of SARS-CoV-2 by FDA under an Emergency Use Authorization (EUA). This EUA will remain  in effect (meaning this test can be used) for the duration of the COVID-19 declaration under Section 56 4(b)(1) of the Act, 21 U.S.C. section 360bbb-3(b)(1), unless the authorization is terminated or revoked sooner. Performed at [facility], [place] [address] [place], [place] [address]      Labs: BNP (last 3 results) No results for input(s): BNP  in the last 8760 hours. Basic Metabolic Panel: Recent Labs  Lab 02/19/19 0303 02/20/19 0342 02/21/19 0301 02/22/19 0258 02/23/19 0709  NA 142 144 146* 146* 144  K 4.0 3.8 4.3 4.7 3.8  CL 112* 117* 119* 120* 113*  CO2 18* 19* 18* 17* 21*  GLUCOSE 121* 85 81 92 102*  BUN 33* 44* 39* 31* 27*  CREATININE 1.38* 1.11* 1.03* 0.87 0.94  CALCIUM 9.9 9.6 9.8 9.6 10.0  MG 2.2  --   --   --   --    Liver Function Tests: Recent Labs  Lab 02/18/19 1000 02/19/19 0303  AST 23 20  ALT 24 16  ALKPHOS 113 106  BILITOT 0.6 0.7  PROT 6.0* 5.9*  ALBUMIN 3.3* 3.1*   No results for input(s): LIPASE, AMYLASE in the last 168 hours. Recent Labs  Lab 02/18/19 1000 02/19/19 1026  AMMONIA 13 24   CBC: Recent Labs  Lab 02/18/19 1437 02/18/19 2028 02/19/19 0303 02/20/19 0342  WBC   --   --  15.0* 8.4  NEUTROABS  --   --  12.6*  --   HGB 11.2* 11.6* 12.3 10.5*  HCT 33.0* 34.0* 37.3 32.1*  MCV  --   --  91.9 93.0  PLT  --   --  216 160   Cardiac Enzymes: No results for input(s): CKTOTAL, CKMB, CKMBINDEX, TROPONINI in the last 168 hours. BNP: Invalid input(s): POCBNP CBG: No results for input(s): GLUCAP in the last 168 hours. D-Dimer No results for input(s): DDIMER in the last 72 hours. Hgb A1c No results for input(s): HGBA1C in the last 72 hours. Lipid Profile No results for input(s): CHOL, HDL, LDLCALC, TRIG, CHOLHDL, LDLDIRECT in the last 72 hours. Thyroid function studies No results for input(s): TSH, T4TOTAL, T3FREE, THYROIDAB in the last 72 hours.  Invalid input(s): FREET3 Anemia work up No results for input(s): VITAMINB12, FOLATE, FERRITIN, TIBC, IRON, RETICCTPCT in the last 72 hours. Urinalysis    Component Value Date/Time   COLORURINE AMBER (A) 02/16/2019 1807   APPEARANCEUR CLOUDY (A) 02/16/2019 1807   LABSPEC 1.020 02/16/2019 1807   PHURINE 6.0 02/16/2019 1807   GLUCOSEU NEGATIVE 02/16/2019 1807   HGBUR NEGATIVE 02/16/2019 1807   BILIRUBINUR NEGATIVE 02/16/2019 1807   KETONESUR NEGATIVE 02/16/2019 1807   PROTEINUR 100 (A) 02/16/2019 1807   UROBILINOGEN 0.2 06/25/2011 1028   NITRITE NEGATIVE 02/16/2019 1807   LEUKOCYTESUR LARGE (A) 02/16/2019 1807   Sepsis Labs Invalid input(s): PROCALCITONIN,  WBC,  LACTICIDVEN Microbiology Recent Results (from the past 240 hour(s))  SARS CORONAVIRUS 2 (TAT 6-24 HRS) Nasopharyngeal Nasopharyngeal Swab     Status: None   Collection Time: 02/17/19 12:09 AM   Specimen: Nasopharyngeal Swab  Result Value Ref Range Status   SARS Coronavirus 2 NEGATIVE NEGATIVE Final    Comment: (NOTE) SARS-CoV-2 target nucleic acids are NOT DETECTED. The SARS-CoV-2 RNA is generally detectable in upper and lower respiratory specimens during the acute phase of infection. Negative results do not preclude SARS-CoV-2  infection, do not rule out co-infections with other pathogens, and should not be used as the sole basis for treatment or other patient management decisions. Negative results must be combined with clinical observations, patient history, and epidemiological information. The expected result is Negative. Fact Sheet for Patients: SugarRoll.be Fact Sheet for Healthcare Providers: https://www.woods-mathews.com/ This test is not yet approved or cleared by the [place] FDA and  has been authorized for detection and/or diagnosis of SARS-CoV-2 by FDA under an Emergency Use  Authorization (EUA). This EUA will remain  in effect (meaning this test can be used) for the duration of the COVID-19 declaration under Section 56 4(b)(1) of the Act, 21 U.S.C. section 360bbb-3(b)(1), unless the authorization is terminated or revoked sooner. Performed at [facility], [place] [address] [place], [place] [address]   MRSA PCR Screening     Status: None   Collection Time: 02/17/19  3:15 AM   Specimen: Nasal Mucosa; Nasopharyngeal  Result Value Ref Range Status   MRSA by PCR NEGATIVE NEGATIVE Final    Comment:        The GeneXpert MRSA Assay (FDA approved for NASAL specimens only), is one component of a comprehensive MRSA colonization surveillance program. It is not intended to diagnose MRSA infection nor to guide or monitor treatment for MRSA infections. Performed at [facility], [place] [address] [place], [place] [address]   Surgical PCR screen     Status: None   Collection Time: 02/17/19  3:15 AM   Specimen: Nasal Mucosa; Nasal Swab  Result Value Ref Range Status   MRSA, PCR NEGATIVE NEGATIVE Final   Staphylococcus aureus NEGATIVE NEGATIVE Final    Comment: (NOTE) The Xpert SA Assay (FDA approved for NASAL specimens in patients 7 years of age and older), is one component of a comprehensive surveillance program. It is not intended to diagnose  infection nor to guide or monitor treatment. Performed at [facility], [place] [address], [place], [place] [address]   SARS CORONAVIRUS 2 (TAT 6-24 HRS) Nasopharyngeal Nasopharyngeal Swab     Status: None   Collection Time: 02/24/19 10:03 AM   Specimen: Nasopharyngeal Swab  Result Value Ref Range Status   SARS Coronavirus 2 NEGATIVE NEGATIVE Final    Comment: (NOTE) SARS-CoV-2 target nucleic acids are NOT DETECTED. The SARS-CoV-2 RNA is generally detectable in upper and lower respiratory specimens during the acute phase of infection. Negative results do not preclude SARS-CoV-2 infection, do not rule out co-infections with other pathogens, and should not be used as the sole basis for treatment or other patient management decisions. Negative results must be combined with clinical observations, patient history, and epidemiological information. The expected result is Negative. Fact Sheet for Patients: SugarRoll.be Fact Sheet for Healthcare Providers: https://www.woods-mathews.com/ This test is not yet approved or cleared by the [place] FDA and  has been authorized for detection and/or diagnosis of SARS-CoV-2 by FDA under an Emergency Use Authorization (EUA). This EUA will remain  in effect (meaning this test can be used) for the duration of the COVID-19 declaration under Section 56 4(b)(1) of the Act, 21 U.S.C. section 360bbb-3(b)(1), unless the authorization is terminated or revoked sooner. Performed at [facility], [place] [address] [place], [place] [address]      Patient was seen and examined on the day of discharge and was found to be in stable condition. Time coordinating discharge: 35 minutes including assessment and coordination of care, as well as examination of the patient.   SIGNED:  Dessa Phi, DO [facility] 02/25/2019, 8:09 AM

## 2019-02-25 NOTE — Progress Notes (Signed)
02/25/2019 10:56 AM Report called to [facility]. [place]

## 2019-02-26 DIAGNOSIS — F0390 Unspecified dementia without behavioral disturbance: Secondary | ICD-10-CM | POA: Diagnosis not present

## 2019-02-26 DIAGNOSIS — I442 Atrioventricular block, complete: Secondary | ICD-10-CM | POA: Diagnosis not present

## 2019-02-26 DIAGNOSIS — J9601 Acute respiratory failure with hypoxia: Secondary | ICD-10-CM | POA: Diagnosis not present

## 2019-02-26 DIAGNOSIS — G9341 Metabolic encephalopathy: Secondary | ICD-10-CM | POA: Diagnosis not present

## 2019-02-27 ENCOUNTER — Other Ambulatory Visit: Payer: Self-pay

## 2019-02-27 ENCOUNTER — Ambulatory Visit (INDEPENDENT_AMBULATORY_CARE_PROVIDER_SITE_OTHER): Payer: Medicare HMO | Admitting: *Deleted

## 2019-02-27 DIAGNOSIS — I442 Atrioventricular block, complete: Secondary | ICD-10-CM | POA: Diagnosis not present

## 2019-02-27 LAB — CUP PACEART INCLINIC DEVICE CHECK
Battery Remaining Longevity: 142 mo
Battery Voltage: 3.21 V
Brady Statistic AP VP Percent: 6.15 %
Brady Statistic AP VS Percent: 0 %
Brady Statistic AS VP Percent: 84.71 %
Brady Statistic AS VS Percent: 9.15 %
Brady Statistic RA Percent Paced: 9.17 %
Brady Statistic RV Percent Paced: 90.85 %
Date Time Interrogation Session: 20201210122700
Implantable Lead Implant Date: 20201130
Implantable Lead Implant Date: 20201130
Implantable Lead Location: 753859
Implantable Lead Location: 753860
Implantable Lead Model: 5076
Implantable Lead Model: 5076
Implantable Pulse Generator Implant Date: 20201130
Lead Channel Impedance Value: 266 Ohm
Lead Channel Impedance Value: 418 Ohm
Lead Channel Impedance Value: 494 Ohm
Lead Channel Impedance Value: 570 Ohm
Lead Channel Pacing Threshold Amplitude: 0.5 V
Lead Channel Pacing Threshold Amplitude: 1.25 V
Lead Channel Pacing Threshold Pulse Width: 0.4 ms
Lead Channel Pacing Threshold Pulse Width: 0.4 ms
Lead Channel Sensing Intrinsic Amplitude: 1.875 mV
Lead Channel Setting Pacing Amplitude: 3.5 V
Lead Channel Setting Pacing Amplitude: 3.5 V
Lead Channel Setting Pacing Pulse Width: 0.4 ms
Lead Channel Setting Sensing Sensitivity: 2.8 mV

## 2019-02-27 NOTE — Progress Notes (Signed)
Wound check appointment. Steri-strips removed. Wound without redness or edema. Incision edges approximated, wound well healed. Normal device function. Thresholds, sensing, and impedances consistent with implant measurements. Device programmed at 3.5V/auto capture programmed on for extra safety margin until 3 month visit. Histogram distribution appropriate for patient and level of activity. No mode switches or high ventricular rates noted. Patient educated about wound care, arm mobility, lifting restrictions but unable to retain information due to dementia. HCPOA, Shannon Blair on speaker phone for appointment. Facility did not send caregiver to appointment with patient. Orders written to include wound care instructions, remote monitoring,  and  future appointments. Orders written for patient to be accompanied to appointments by family, HCPOA, or caregiver for future follow-up visits. Next remote scheduled for 05/20/19. ROV on 05/22/19 with Dr Curt Bears. Patient safely loaded into transport van by attendant.

## 2019-02-27 NOTE — Patient Instructions (Signed)
Call office if patient develops redness, swelling or drainage from wound site. Patient should be accompanied to visits by caregiver or family member due to dementia.

## 2019-02-28 DIAGNOSIS — N189 Chronic kidney disease, unspecified: Secondary | ICD-10-CM | POA: Diagnosis not present

## 2019-02-28 DIAGNOSIS — F0390 Unspecified dementia without behavioral disturbance: Secondary | ICD-10-CM | POA: Diagnosis not present

## 2019-02-28 DIAGNOSIS — I442 Atrioventricular block, complete: Secondary | ICD-10-CM | POA: Diagnosis not present

## 2019-02-28 DIAGNOSIS — G9341 Metabolic encephalopathy: Secondary | ICD-10-CM | POA: Diagnosis not present

## 2019-03-03 DIAGNOSIS — F329 Major depressive disorder, single episode, unspecified: Secondary | ICD-10-CM | POA: Diagnosis not present

## 2019-03-03 DIAGNOSIS — I442 Atrioventricular block, complete: Secondary | ICD-10-CM | POA: Diagnosis not present

## 2019-03-03 DIAGNOSIS — F0390 Unspecified dementia without behavioral disturbance: Secondary | ICD-10-CM | POA: Diagnosis not present

## 2019-03-03 DIAGNOSIS — N390 Urinary tract infection, site not specified: Secondary | ICD-10-CM | POA: Diagnosis not present

## 2019-03-06 DIAGNOSIS — W19XXXA Unspecified fall, initial encounter: Secondary | ICD-10-CM | POA: Diagnosis not present

## 2019-03-06 DIAGNOSIS — I442 Atrioventricular block, complete: Secondary | ICD-10-CM | POA: Diagnosis not present

## 2019-03-06 DIAGNOSIS — S0083XA Contusion of other part of head, initial encounter: Secondary | ICD-10-CM | POA: Diagnosis not present

## 2019-03-06 DIAGNOSIS — F0390 Unspecified dementia without behavioral disturbance: Secondary | ICD-10-CM | POA: Diagnosis not present

## 2019-03-09 DIAGNOSIS — F329 Major depressive disorder, single episode, unspecified: Secondary | ICD-10-CM | POA: Diagnosis not present

## 2019-03-09 DIAGNOSIS — G9341 Metabolic encephalopathy: Secondary | ICD-10-CM | POA: Diagnosis not present

## 2019-03-09 DIAGNOSIS — F0390 Unspecified dementia without behavioral disturbance: Secondary | ICD-10-CM | POA: Diagnosis not present

## 2019-03-09 DIAGNOSIS — S0083XA Contusion of other part of head, initial encounter: Secondary | ICD-10-CM | POA: Diagnosis not present

## 2019-03-09 DIAGNOSIS — J9601 Acute respiratory failure with hypoxia: Secondary | ICD-10-CM | POA: Diagnosis not present

## 2019-03-09 DIAGNOSIS — I10 Essential (primary) hypertension: Secondary | ICD-10-CM | POA: Diagnosis not present

## 2019-03-09 DIAGNOSIS — R531 Weakness: Secondary | ICD-10-CM | POA: Diagnosis not present

## 2019-03-09 DIAGNOSIS — N1832 Chronic kidney disease, stage 3b: Secondary | ICD-10-CM | POA: Diagnosis not present

## 2019-03-09 DIAGNOSIS — I442 Atrioventricular block, complete: Secondary | ICD-10-CM | POA: Diagnosis not present

## 2019-03-11 DIAGNOSIS — I10 Essential (primary) hypertension: Secondary | ICD-10-CM | POA: Diagnosis not present

## 2019-03-11 DIAGNOSIS — I442 Atrioventricular block, complete: Secondary | ICD-10-CM | POA: Diagnosis not present

## 2019-03-11 DIAGNOSIS — R531 Weakness: Secondary | ICD-10-CM | POA: Diagnosis not present

## 2019-03-11 DIAGNOSIS — F0390 Unspecified dementia without behavioral disturbance: Secondary | ICD-10-CM | POA: Diagnosis not present

## 2019-03-26 DIAGNOSIS — U071 COVID-19: Secondary | ICD-10-CM | POA: Diagnosis not present

## 2019-03-26 DIAGNOSIS — C210 Malignant neoplasm of anus, unspecified: Secondary | ICD-10-CM | POA: Diagnosis not present

## 2019-03-26 DIAGNOSIS — F0390 Unspecified dementia without behavioral disturbance: Secondary | ICD-10-CM | POA: Diagnosis not present

## 2019-03-26 DIAGNOSIS — I442 Atrioventricular block, complete: Secondary | ICD-10-CM | POA: Diagnosis not present

## 2019-03-28 DIAGNOSIS — I442 Atrioventricular block, complete: Secondary | ICD-10-CM | POA: Diagnosis not present

## 2019-03-28 DIAGNOSIS — N189 Chronic kidney disease, unspecified: Secondary | ICD-10-CM | POA: Diagnosis not present

## 2019-03-28 DIAGNOSIS — G9341 Metabolic encephalopathy: Secondary | ICD-10-CM | POA: Diagnosis not present

## 2019-03-28 DIAGNOSIS — I10 Essential (primary) hypertension: Secondary | ICD-10-CM | POA: Diagnosis not present

## 2019-03-28 DIAGNOSIS — F0390 Unspecified dementia without behavioral disturbance: Secondary | ICD-10-CM | POA: Diagnosis not present

## 2019-03-28 DIAGNOSIS — U071 COVID-19: Secondary | ICD-10-CM | POA: Diagnosis not present

## 2019-04-21 DEATH — deceased

## 2019-05-22 ENCOUNTER — Encounter: Payer: Medicare HMO | Admitting: Cardiology
